# Patient Record
Sex: Female | Born: 1935 | Hispanic: No | Marital: Married | Smoking: Never smoker
Health system: Southern US, Community
[De-identification: ages and names within clinical notes are randomized; demographics above are authoritative.]

## PROBLEM LIST (undated history)

## (undated) DIAGNOSIS — D649 Anemia, unspecified: Secondary | ICD-10-CM

## (undated) DIAGNOSIS — I739 Peripheral vascular disease, unspecified: Secondary | ICD-10-CM

## (undated) DIAGNOSIS — E079 Disorder of thyroid, unspecified: Secondary | ICD-10-CM

## (undated) DIAGNOSIS — I272 Pulmonary hypertension, unspecified: Secondary | ICD-10-CM

## (undated) DIAGNOSIS — R092 Respiratory arrest: Secondary | ICD-10-CM

## (undated) DIAGNOSIS — N183 Chronic kidney disease, stage 3 unspecified: Secondary | ICD-10-CM

## (undated) DIAGNOSIS — N289 Disorder of kidney and ureter, unspecified: Secondary | ICD-10-CM

## (undated) DIAGNOSIS — E119 Type 2 diabetes mellitus without complications: Secondary | ICD-10-CM

## (undated) DIAGNOSIS — Z9289 Personal history of other medical treatment: Secondary | ICD-10-CM

## (undated) DIAGNOSIS — E785 Hyperlipidemia, unspecified: Secondary | ICD-10-CM

## (undated) DIAGNOSIS — Z951 Presence of aortocoronary bypass graft: Secondary | ICD-10-CM

## (undated) DIAGNOSIS — N179 Acute kidney failure, unspecified: Secondary | ICD-10-CM

## (undated) DIAGNOSIS — I340 Nonrheumatic mitral (valve) insufficiency: Secondary | ICD-10-CM

## (undated) DIAGNOSIS — E538 Deficiency of other specified B group vitamins: Secondary | ICD-10-CM

## (undated) DIAGNOSIS — I071 Rheumatic tricuspid insufficiency: Secondary | ICD-10-CM

## (undated) DIAGNOSIS — I5032 Chronic diastolic (congestive) heart failure: Secondary | ICD-10-CM

## (undated) DIAGNOSIS — N189 Chronic kidney disease, unspecified: Secondary | ICD-10-CM

## (undated) DIAGNOSIS — I70219 Atherosclerosis of native arteries of extremities with intermittent claudication, unspecified extremity: Secondary | ICD-10-CM

## (undated) DIAGNOSIS — I501 Left ventricular failure: Secondary | ICD-10-CM

## (undated) DIAGNOSIS — N952 Postmenopausal atrophic vaginitis: Secondary | ICD-10-CM

## (undated) DIAGNOSIS — I2510 Atherosclerotic heart disease of native coronary artery without angina pectoris: Secondary | ICD-10-CM

## (undated) HISTORY — DX: Pulmonary hypertension, unspecified: I27.20

## (undated) HISTORY — DX: Nonrheumatic mitral (valve) insufficiency: I34.0

## (undated) HISTORY — DX: Chronic kidney disease, unspecified: N18.9

## (undated) HISTORY — DX: Type 2 diabetes mellitus without complications: E11.9

## (undated) HISTORY — DX: Atherosclerotic heart disease of native coronary artery without angina pectoris: I25.10

## (undated) HISTORY — DX: Anemia, unspecified: D64.9

## (undated) HISTORY — DX: Chronic diastolic (congestive) heart failure: I50.32

## (undated) HISTORY — DX: Chronic kidney disease, stage 3 (moderate): N18.3

## (undated) HISTORY — DX: Rheumatic tricuspid insufficiency: I07.1

## (undated) HISTORY — DX: Peripheral vascular disease, unspecified: I73.9

## (undated) HISTORY — PX: TOTAL KNEE ARTHROPLASTY: SHX125

## (undated) HISTORY — DX: Acute kidney failure, unspecified: N17.9

## (undated) HISTORY — DX: Disorder of kidney and ureter, unspecified: N28.9

## (undated) HISTORY — DX: Personal history of other medical treatment: Z92.89

## (undated) HISTORY — DX: Deficiency of other specified B group vitamins: E53.8

## (undated) HISTORY — DX: Chronic kidney disease, stage 3 unspecified: N18.30

## (undated) HISTORY — DX: Left ventricular failure, unspecified: I50.1

## (undated) HISTORY — DX: Postmenopausal atrophic vaginitis: N95.2

## (undated) HISTORY — DX: Presence of aortocoronary bypass graft: Z95.1

---

## 2003-10-24 HISTORY — PX: CARDIAC CATHETERIZATION: SHX172

## 2004-04-14 ENCOUNTER — Encounter: Admission: RE | Admit: 2004-04-14 | Discharge: 2004-04-14 | Payer: Self-pay | Admitting: Cardiovascular Disease

## 2004-04-19 ENCOUNTER — Ambulatory Visit (HOSPITAL_COMMUNITY): Admission: RE | Admit: 2004-04-19 | Discharge: 2004-04-19 | Payer: Self-pay | Admitting: Cardiovascular Disease

## 2004-04-20 ENCOUNTER — Inpatient Hospital Stay (HOSPITAL_COMMUNITY): Admission: AD | Admit: 2004-04-20 | Discharge: 2004-04-21 | Payer: Self-pay | Admitting: Cardiovascular Disease

## 2004-06-29 ENCOUNTER — Encounter: Admission: RE | Admit: 2004-06-29 | Discharge: 2004-06-29 | Payer: Self-pay | Admitting: Cardiovascular Disease

## 2005-01-03 IMAGING — CR DG CHEST 2V
2 series · 2 of 2 positions shown · non-contrast
Comparison: none

CLINICAL DATA: Left chest pain for the past six months. 
TWO VIEW CHEST: 
Borderline enlarged cardiac silhouette.  Diffuse peribronchial thickening and accentuation of the interstitial markings.  Multiple small mediastinal, bilateral hilar, left axillary, right supraclavicular, and left neck calcified lymph nodes.  Small calcified granulomata at the left lung apex.  Small calcified granuloma or fibroadenoma in the posterior aspect of the right breast superiorly.  Calcified granulomata in the liver and spleen and calcified upper abdominal lymph nodes.  Diffuse osteopenia.  Mild scoliosis.

[view not recorded (1 of 2)]
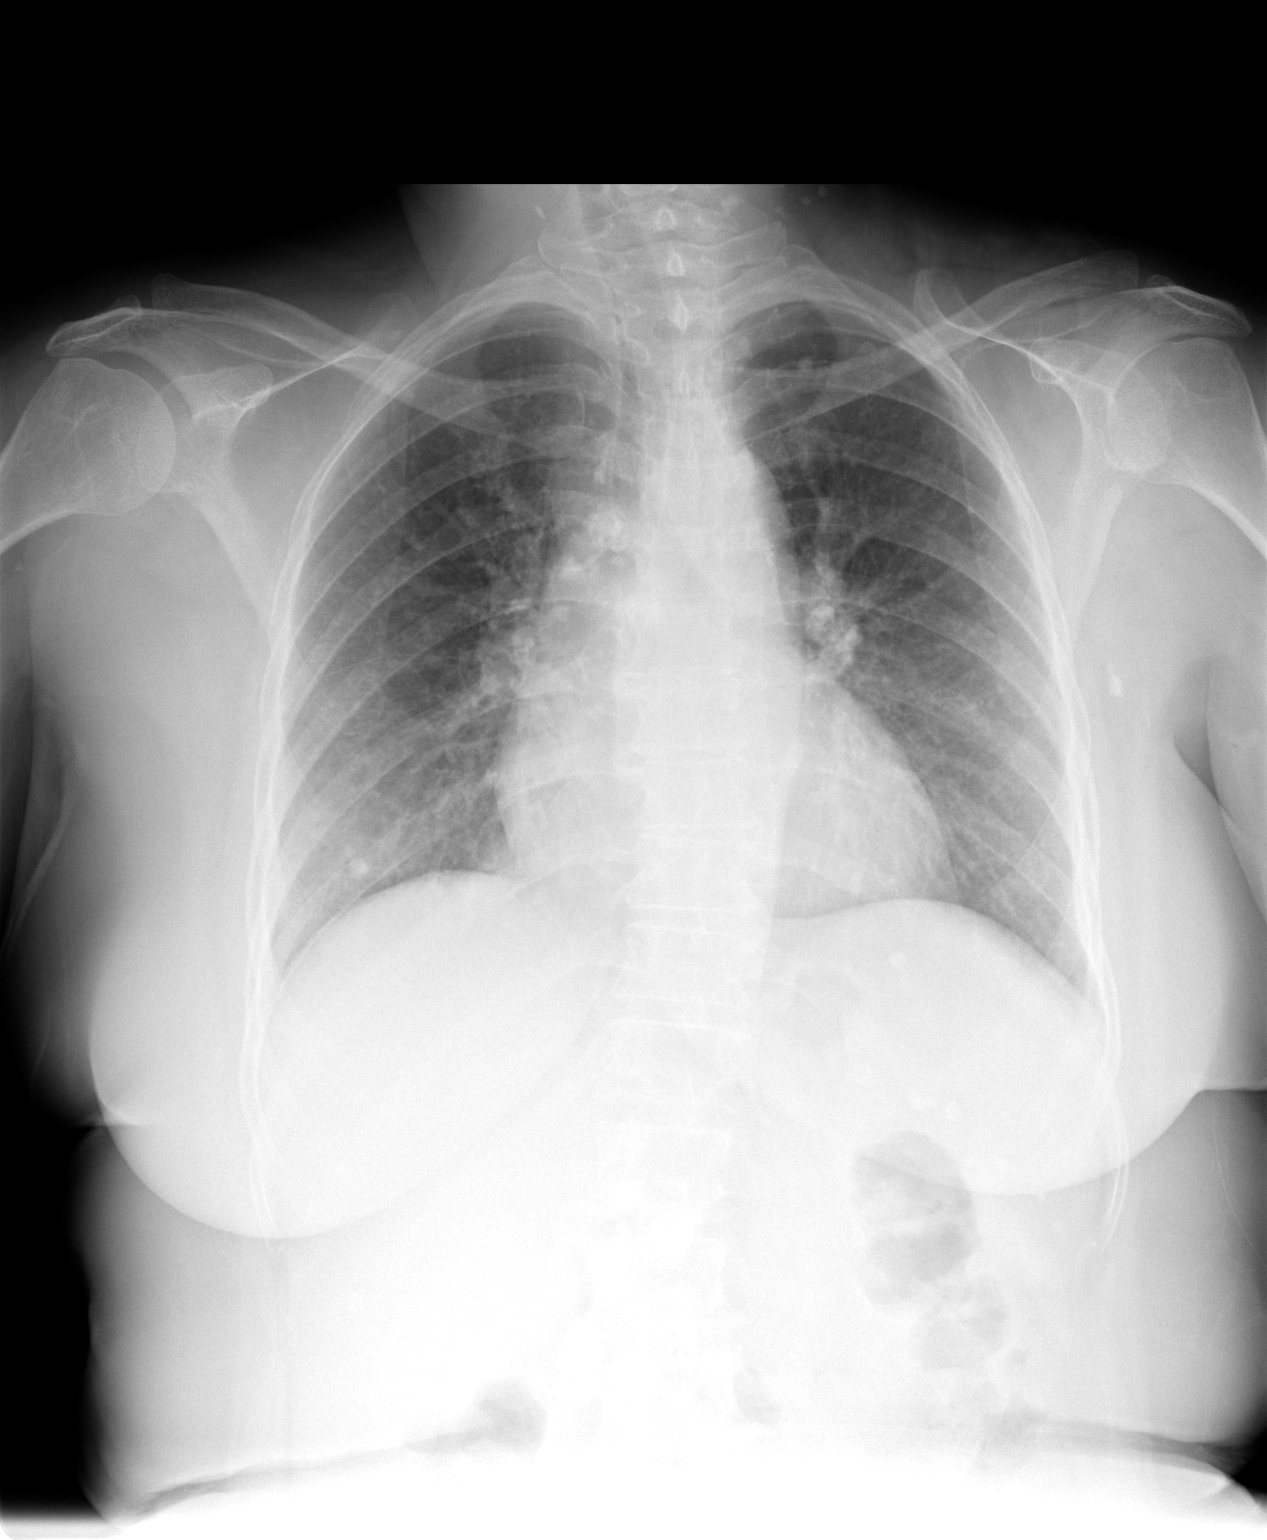

[view not recorded (2 of 2)]
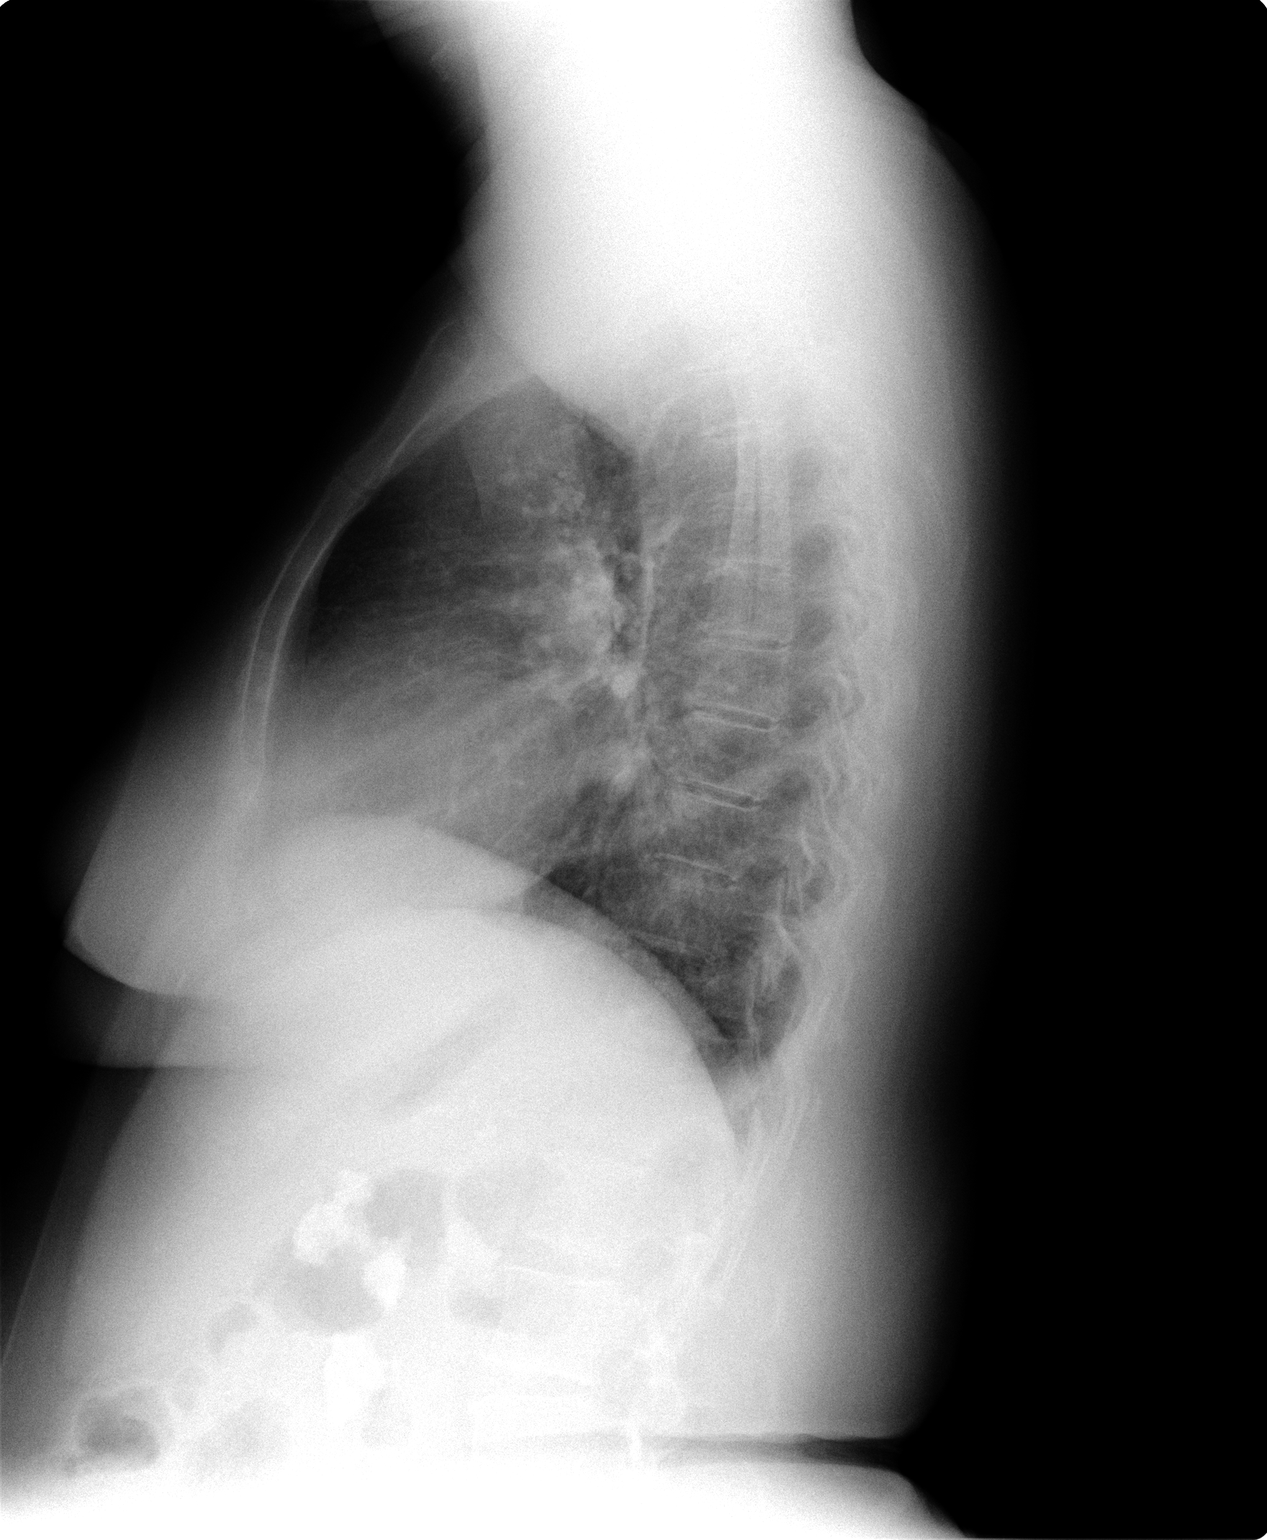

[2 of 2 positions shown; findings below may reference images not displayed]

IMPRESSION: 1.  Borderline cardiomegaly. 
2.  Mild to moderate chronic bronchitic changes and chronic interstitial lung disease. 
3.  Previous granulomatous infection.

## 2009-12-31 ENCOUNTER — Emergency Department (HOSPITAL_COMMUNITY): Admission: EM | Admit: 2009-12-31 | Discharge: 2009-12-31 | Payer: Self-pay | Admitting: Emergency Medicine

## 2010-03-14 ENCOUNTER — Inpatient Hospital Stay (HOSPITAL_COMMUNITY)
Admission: EM | Admit: 2010-03-14 | Discharge: 2010-03-31 | Payer: Self-pay | Source: Home / Self Care | Admitting: Emergency Medicine

## 2010-03-14 ENCOUNTER — Ambulatory Visit: Payer: Self-pay | Admitting: Critical Care Medicine

## 2010-03-15 ENCOUNTER — Encounter (INDEPENDENT_AMBULATORY_CARE_PROVIDER_SITE_OTHER): Payer: Self-pay | Admitting: Internal Medicine

## 2010-03-16 ENCOUNTER — Ambulatory Visit: Payer: Self-pay | Admitting: Surgery

## 2010-03-16 ENCOUNTER — Encounter: Payer: Self-pay | Admitting: Surgery

## 2010-03-23 DIAGNOSIS — I2510 Atherosclerotic heart disease of native coronary artery without angina pectoris: Secondary | ICD-10-CM | POA: Diagnosis present

## 2010-03-23 HISTORY — PX: CORONARY ARTERY BYPASS GRAFT: SHX141

## 2010-03-23 HISTORY — PX: CARDIAC CATHETERIZATION: SHX172

## 2010-04-06 ENCOUNTER — Ambulatory Visit: Payer: Self-pay | Admitting: Surgery

## 2010-04-19 ENCOUNTER — Ambulatory Visit: Payer: Self-pay | Admitting: Surgery

## 2010-04-19 ENCOUNTER — Encounter: Admission: RE | Admit: 2010-04-19 | Discharge: 2010-04-19 | Payer: Self-pay | Admitting: Surgery

## 2010-04-22 ENCOUNTER — Ambulatory Visit: Payer: Self-pay | Admitting: Surgery

## 2010-04-28 ENCOUNTER — Ambulatory Visit: Payer: Self-pay | Admitting: Surgery

## 2010-04-28 ENCOUNTER — Encounter (HOSPITAL_COMMUNITY): Admission: RE | Admit: 2010-04-28 | Discharge: 2010-07-01 | Payer: Self-pay | Admitting: Interventional Cardiology

## 2010-05-09 ENCOUNTER — Ambulatory Visit: Payer: Self-pay | Admitting: Surgery

## 2010-05-30 ENCOUNTER — Ambulatory Visit: Payer: Self-pay | Admitting: Surgery

## 2011-01-06 LAB — GLUCOSE, CAPILLARY
Glucose-Capillary: 191 mg/dL — ABNORMAL HIGH (ref 70–99)
Glucose-Capillary: 278 mg/dL — ABNORMAL HIGH (ref 70–99)

## 2011-01-07 LAB — GLUCOSE, CAPILLARY
Glucose-Capillary: 137 mg/dL — ABNORMAL HIGH (ref 70–99)
Glucose-Capillary: 248 mg/dL — ABNORMAL HIGH (ref 70–99)
Glucose-Capillary: 262 mg/dL — ABNORMAL HIGH (ref 70–99)
Glucose-Capillary: 334 mg/dL — ABNORMAL HIGH (ref 70–99)

## 2011-01-08 LAB — GLUCOSE, CAPILLARY
Glucose-Capillary: 235 mg/dL — ABNORMAL HIGH (ref 70–99)
Glucose-Capillary: 297 mg/dL — ABNORMAL HIGH (ref 70–99)

## 2011-01-08 IMAGING — CR DG CHEST 2V
2 series · 2 of 2 positions shown · non-contrast
Comparison: 03/29/2010

CLINICAL DATA: Status post CABG 03/23/2010.  No complains today.
Nonsmoker.

CHEST - 2 VIEW

[w chest pa]
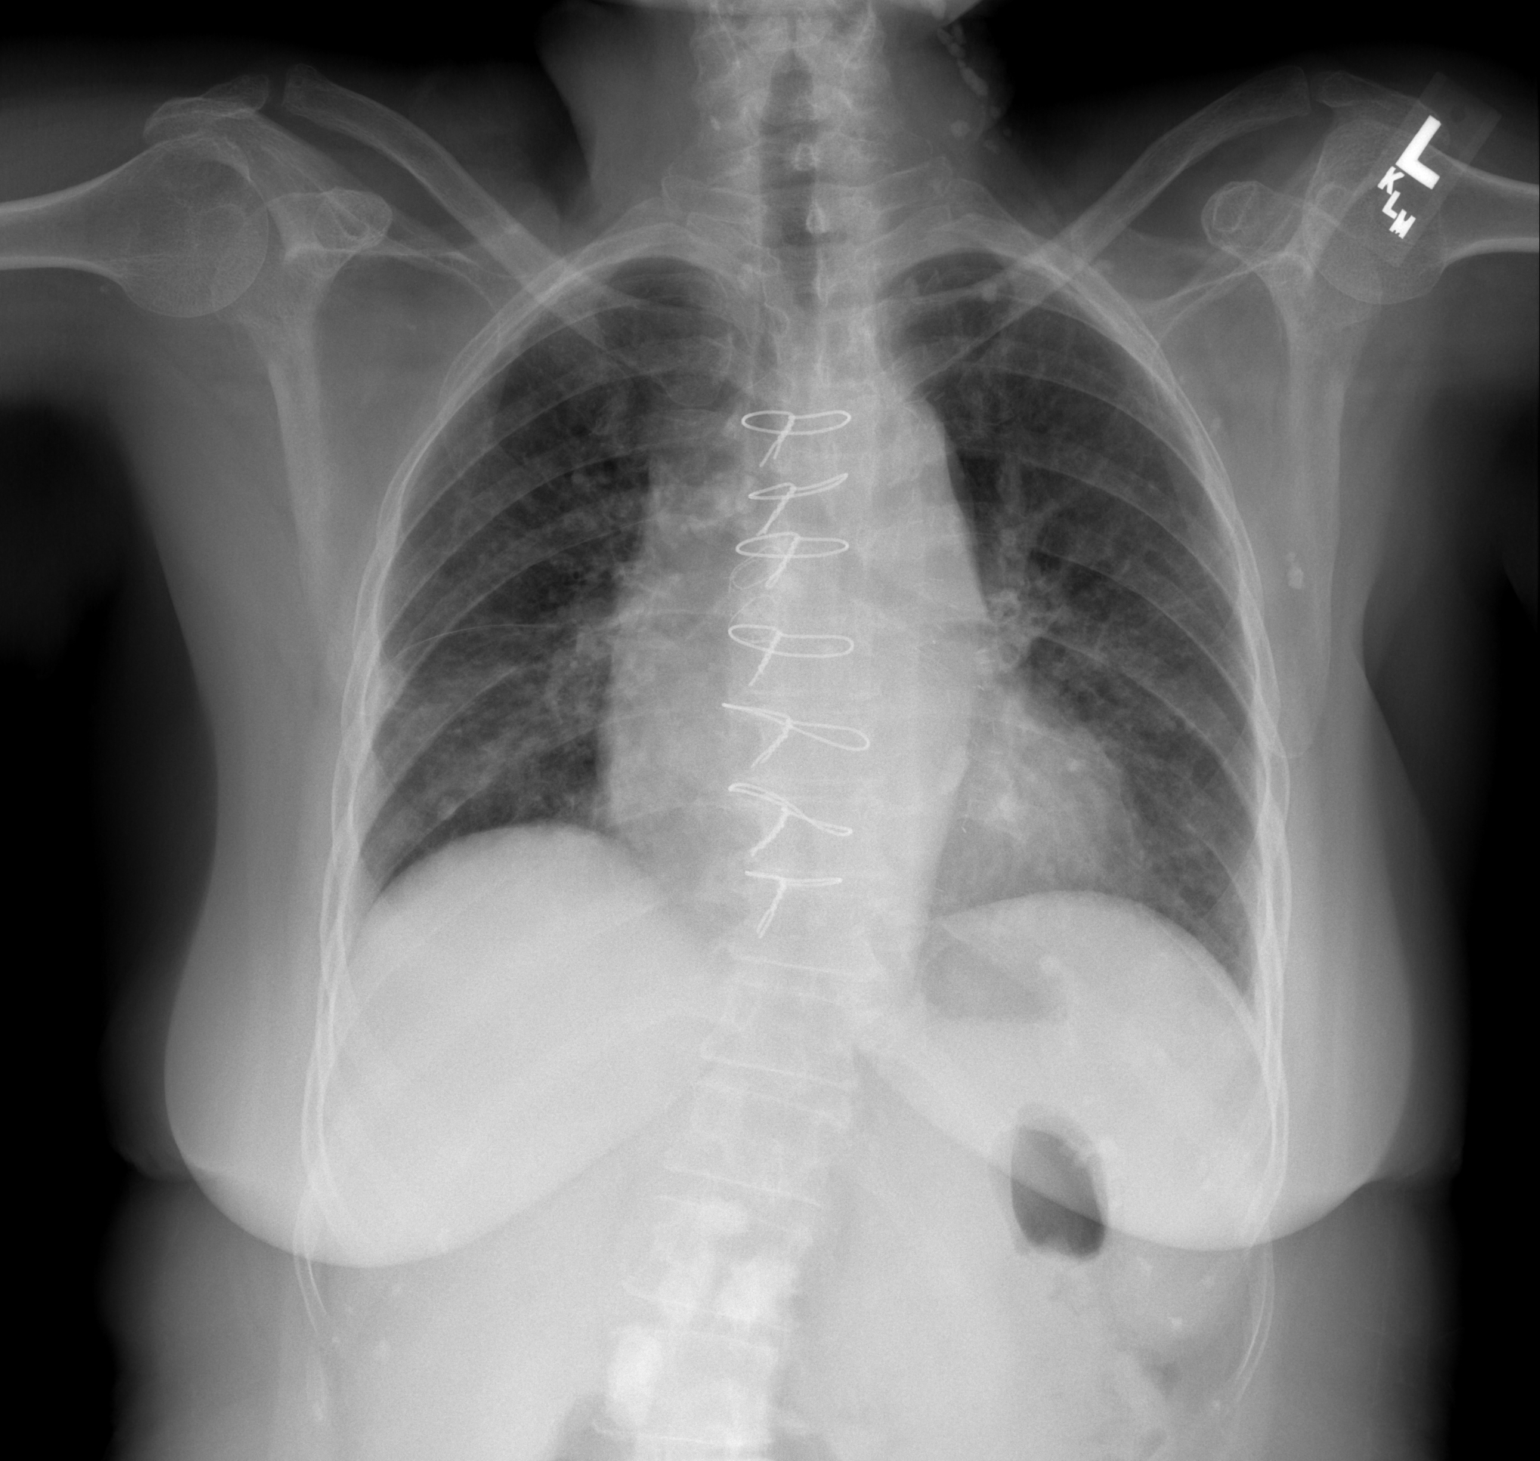

[w chest lat]
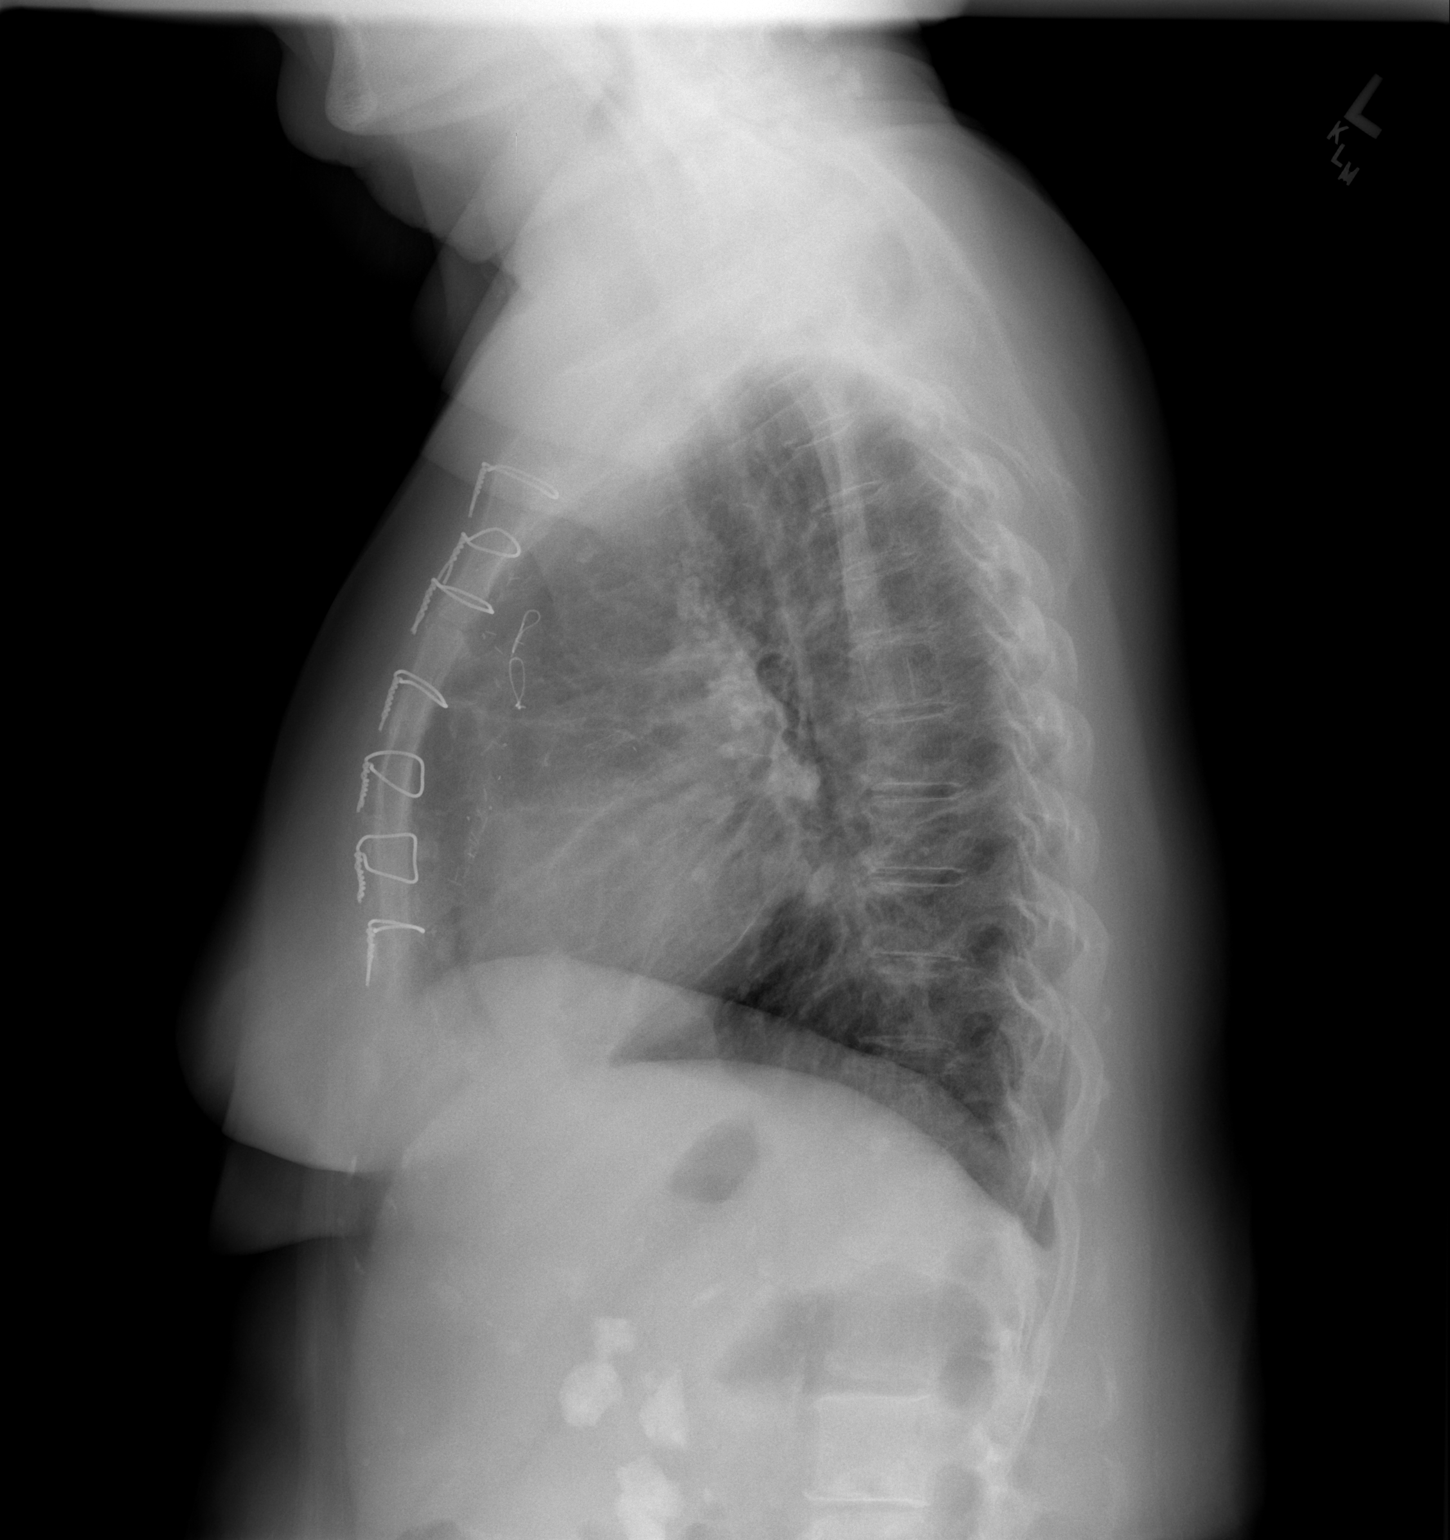

[2 of 2 positions shown; findings below may reference images not displayed]

FINDINGS: Prior median sternotomy for CABG.  Suspect remote right
rib trauma.  Calcified left axillary and low cervical lymph nodes.

Midline trachea.  Mild cardiomegaly accentuated by low lung
volumes.  Small bilateral pleural effusions have resolved. No
pneumothorax.  Resolution of interstitial edema.  Lung volumes
remain low.  This accentuates the pulmonary interstitium.  There
are calcified lymph nodes within the left side of the mediastinum
and probable granuloma at the left apex.  Calcified lymph nodes are
also identified within the upper abdomen.
IMPRESSION: 1.  Resolution of interstitial edema and bilateral pleural
effusions.
2.  Low lung volumes without acute process.
3.  Old granulomatous disease.

## 2011-01-09 LAB — GLUCOSE, CAPILLARY
Glucose-Capillary: 100 mg/dL — ABNORMAL HIGH (ref 70–99)
Glucose-Capillary: 101 mg/dL — ABNORMAL HIGH (ref 70–99)
Glucose-Capillary: 103 mg/dL — ABNORMAL HIGH (ref 70–99)
Glucose-Capillary: 105 mg/dL — ABNORMAL HIGH (ref 70–99)
Glucose-Capillary: 111 mg/dL — ABNORMAL HIGH (ref 70–99)
Glucose-Capillary: 114 mg/dL — ABNORMAL HIGH (ref 70–99)
Glucose-Capillary: 114 mg/dL — ABNORMAL HIGH (ref 70–99)
Glucose-Capillary: 120 mg/dL — ABNORMAL HIGH (ref 70–99)
Glucose-Capillary: 124 mg/dL — ABNORMAL HIGH (ref 70–99)
Glucose-Capillary: 125 mg/dL — ABNORMAL HIGH (ref 70–99)
Glucose-Capillary: 132 mg/dL — ABNORMAL HIGH (ref 70–99)
Glucose-Capillary: 162 mg/dL — ABNORMAL HIGH (ref 70–99)
Glucose-Capillary: 180 mg/dL — ABNORMAL HIGH (ref 70–99)
Glucose-Capillary: 184 mg/dL — ABNORMAL HIGH (ref 70–99)
Glucose-Capillary: 187 mg/dL — ABNORMAL HIGH (ref 70–99)
Glucose-Capillary: 201 mg/dL — ABNORMAL HIGH (ref 70–99)
Glucose-Capillary: 246 mg/dL — ABNORMAL HIGH (ref 70–99)
Glucose-Capillary: 57 mg/dL — ABNORMAL LOW (ref 70–99)
Glucose-Capillary: 68 mg/dL — ABNORMAL LOW (ref 70–99)
Glucose-Capillary: 69 mg/dL — ABNORMAL LOW (ref 70–99)
Glucose-Capillary: 76 mg/dL (ref 70–99)
Glucose-Capillary: 80 mg/dL (ref 70–99)
Glucose-Capillary: 83 mg/dL (ref 70–99)
Glucose-Capillary: 85 mg/dL (ref 70–99)
Glucose-Capillary: 89 mg/dL (ref 70–99)
Glucose-Capillary: 90 mg/dL (ref 70–99)
Glucose-Capillary: 109 mg/dL — ABNORMAL HIGH (ref 70–99)
Glucose-Capillary: 119 mg/dL — ABNORMAL HIGH (ref 70–99)
Glucose-Capillary: 138 mg/dL — ABNORMAL HIGH (ref 70–99)
Glucose-Capillary: 149 mg/dL — ABNORMAL HIGH (ref 70–99)
Glucose-Capillary: 165 mg/dL — ABNORMAL HIGH (ref 70–99)
Glucose-Capillary: 175 mg/dL — ABNORMAL HIGH (ref 70–99)
Glucose-Capillary: 176 mg/dL — ABNORMAL HIGH (ref 70–99)
Glucose-Capillary: 178 mg/dL — ABNORMAL HIGH (ref 70–99)
Glucose-Capillary: 184 mg/dL — ABNORMAL HIGH (ref 70–99)
Glucose-Capillary: 190 mg/dL — ABNORMAL HIGH (ref 70–99)
Glucose-Capillary: 190 mg/dL — ABNORMAL HIGH (ref 70–99)
Glucose-Capillary: 204 mg/dL — ABNORMAL HIGH (ref 70–99)
Glucose-Capillary: 217 mg/dL — ABNORMAL HIGH (ref 70–99)
Glucose-Capillary: 221 mg/dL — ABNORMAL HIGH (ref 70–99)
Glucose-Capillary: 229 mg/dL — ABNORMAL HIGH (ref 70–99)
Glucose-Capillary: 238 mg/dL — ABNORMAL HIGH (ref 70–99)
Glucose-Capillary: 257 mg/dL — ABNORMAL HIGH (ref 70–99)
Glucose-Capillary: 272 mg/dL — ABNORMAL HIGH (ref 70–99)
Glucose-Capillary: 280 mg/dL — ABNORMAL HIGH (ref 70–99)
Glucose-Capillary: 314 mg/dL — ABNORMAL HIGH (ref 70–99)
Glucose-Capillary: 318 mg/dL — ABNORMAL HIGH (ref 70–99)
Glucose-Capillary: 332 mg/dL — ABNORMAL HIGH (ref 70–99)
Glucose-Capillary: 375 mg/dL — ABNORMAL HIGH (ref 70–99)
Glucose-Capillary: 498 mg/dL — ABNORMAL HIGH (ref 70–99)
Glucose-Capillary: 78 mg/dL (ref 70–99)

## 2011-01-09 LAB — CBC
HCT: 24.5 % — ABNORMAL LOW (ref 36.0–46.0)
HCT: 24.6 % — ABNORMAL LOW (ref 36.0–46.0)
HCT: 25.2 % — ABNORMAL LOW (ref 36.0–46.0)
HCT: 26.1 % — ABNORMAL LOW (ref 36.0–46.0)
HCT: 26.8 % — ABNORMAL LOW (ref 36.0–46.0)
HCT: 31 % — ABNORMAL LOW (ref 36.0–46.0)
HCT: 31.2 % — ABNORMAL LOW (ref 36.0–46.0)
HCT: 29.3 % — ABNORMAL LOW (ref 36.0–46.0)
HCT: 29.4 % — ABNORMAL LOW (ref 36.0–46.0)
HCT: 31.9 % — ABNORMAL LOW (ref 36.0–46.0)
Hemoglobin: 10.4 g/dL — ABNORMAL LOW (ref 12.0–15.0)
Hemoglobin: 10.8 g/dL — ABNORMAL LOW (ref 12.0–15.0)
Hemoglobin: 8.2 g/dL — ABNORMAL LOW (ref 12.0–15.0)
Hemoglobin: 8.4 g/dL — ABNORMAL LOW (ref 12.0–15.0)
Hemoglobin: 9.1 g/dL — ABNORMAL LOW (ref 12.0–15.0)
Hemoglobin: 9.7 g/dL — ABNORMAL LOW (ref 12.0–15.0)
Hemoglobin: 10 g/dL — ABNORMAL LOW (ref 12.0–15.0)
Hemoglobin: 11.7 g/dL — ABNORMAL LOW (ref 12.0–15.0)
MCHC: 33.9 g/dL (ref 30.0–36.0)
MCHC: 34.1 g/dL (ref 30.0–36.0)
MCHC: 34.3 g/dL (ref 30.0–36.0)
MCHC: 34.6 g/dL (ref 30.0–36.0)
MCHC: 35 g/dL (ref 30.0–36.0)
MCHC: 34.2 g/dL (ref 30.0–36.0)
MCHC: 34.3 g/dL (ref 30.0–36.0)
MCV: 91.1 fL (ref 78.0–100.0)
MCV: 91.1 fL (ref 78.0–100.0)
MCV: 91.6 fL (ref 78.0–100.0)
MCV: 91.7 fL (ref 78.0–100.0)
MCV: 91.9 fL (ref 78.0–100.0)
MCV: 90.5 fL (ref 78.0–100.0)
MCV: 90.8 fL (ref 78.0–100.0)
MCV: 91.2 fL (ref 78.0–100.0)
Platelets: 104 10*3/uL — ABNORMAL LOW (ref 150–400)
Platelets: 108 10*3/uL — ABNORMAL LOW (ref 150–400)
Platelets: 147 10*3/uL — ABNORMAL LOW (ref 150–400)
Platelets: 226 10*3/uL (ref 150–400)
Platelets: 171 10*3/uL (ref 150–400)
RBC: 2.67 MIL/uL — ABNORMAL LOW (ref 3.87–5.11)
RBC: 2.69 MIL/uL — ABNORMAL LOW (ref 3.87–5.11)
RBC: 2.77 MIL/uL — ABNORMAL LOW (ref 3.87–5.11)
RBC: 2.94 MIL/uL — ABNORMAL LOW (ref 3.87–5.11)
RBC: 3.14 MIL/uL — ABNORMAL LOW (ref 3.87–5.11)
RBC: 3.48 MIL/uL — ABNORMAL LOW (ref 3.87–5.11)
RBC: 3.24 MIL/uL — ABNORMAL LOW (ref 3.87–5.11)
RBC: 3.32 MIL/uL — ABNORMAL LOW (ref 3.87–5.11)
RBC: 3.52 MIL/uL — ABNORMAL LOW (ref 3.87–5.11)
RBC: 3.74 MIL/uL — ABNORMAL LOW (ref 3.87–5.11)
RDW: 13.7 % (ref 11.5–15.5)
RDW: 13.7 % (ref 11.5–15.5)
RDW: 14.2 % (ref 11.5–15.5)
RDW: 14.1 % (ref 11.5–15.5)
RDW: 14.2 % (ref 11.5–15.5)
RDW: 14.2 % (ref 11.5–15.5)
WBC: 10.6 10*3/uL — ABNORMAL HIGH (ref 4.0–10.5)
WBC: 6.6 10*3/uL (ref 4.0–10.5)
WBC: 6.9 10*3/uL (ref 4.0–10.5)
WBC: 9.4 10*3/uL (ref 4.0–10.5)
WBC: 6.8 10*3/uL (ref 4.0–10.5)
WBC: 6.9 10*3/uL (ref 4.0–10.5)

## 2011-01-09 LAB — MRSA PCR SCREENING: MRSA by PCR: NEGATIVE

## 2011-01-09 LAB — COMPREHENSIVE METABOLIC PANEL
ALT: 17 U/L (ref 0–35)
Albumin: 3 g/dL — ABNORMAL LOW (ref 3.5–5.2)
Alkaline Phosphatase: 59 U/L (ref 39–117)
BUN: 21 mg/dL (ref 6–23)
Chloride: 99 mEq/L (ref 96–112)
Glucose, Bld: 255 mg/dL — ABNORMAL HIGH (ref 70–99)
Potassium: 3.5 mEq/L (ref 3.5–5.1)
Sodium: 134 mEq/L — ABNORMAL LOW (ref 135–145)
Total Bilirubin: 0.2 mg/dL — ABNORMAL LOW (ref 0.3–1.2)
Total Protein: 6.8 g/dL (ref 6.0–8.3)

## 2011-01-09 LAB — BASIC METABOLIC PANEL
BUN: 16 mg/dL (ref 6–23)
BUN: 21 mg/dL (ref 6–23)
BUN: 27 mg/dL — ABNORMAL HIGH (ref 6–23)
BUN: 29 mg/dL — ABNORMAL HIGH (ref 6–23)
BUN: 9 mg/dL (ref 6–23)
CO2: 20 mEq/L (ref 19–32)
CO2: 22 mEq/L (ref 19–32)
CO2: 27 mEq/L (ref 19–32)
CO2: 23 mEq/L (ref 19–32)
CO2: 28 mEq/L (ref 19–32)
Calcium: 6.5 mg/dL — ABNORMAL LOW (ref 8.4–10.5)
Calcium: 6.6 mg/dL — ABNORMAL LOW (ref 8.4–10.5)
Chloride: 103 mEq/L (ref 96–112)
Chloride: 107 mEq/L (ref 96–112)
Chloride: 107 mEq/L (ref 96–112)
Chloride: 107 mEq/L (ref 96–112)
Chloride: 108 mEq/L (ref 96–112)
Chloride: 104 mEq/L (ref 96–112)
Chloride: 105 mEq/L (ref 96–112)
Chloride: 108 mEq/L (ref 96–112)
Chloride: 98 mEq/L (ref 96–112)
Creatinine, Ser: 0.94 mg/dL (ref 0.4–1.2)
Creatinine, Ser: 1.03 mg/dL (ref 0.4–1.2)
Creatinine, Ser: 1.16 mg/dL (ref 0.4–1.2)
Creatinine, Ser: 1.3 mg/dL — ABNORMAL HIGH (ref 0.4–1.2)
Creatinine, Ser: 0.82 mg/dL (ref 0.4–1.2)
Creatinine, Ser: 0.83 mg/dL (ref 0.4–1.2)
Creatinine, Ser: 1.15 mg/dL (ref 0.4–1.2)
GFR calc Af Amer: 48 mL/min — ABNORMAL LOW (ref >60)
GFR calc Af Amer: 49 mL/min — ABNORMAL LOW (ref >60)
GFR calc Af Amer: >60 mL/min (ref >60)
GFR calc Af Amer: 56 mL/min — ABNORMAL LOW (ref >60)
GFR calc Af Amer: >60 mL/min (ref >60)
GFR calc Af Amer: >60 mL/min (ref >60)
GFR calc Af Amer: >60 mL/min (ref >60)
GFR calc non Af Amer: 40 mL/min — ABNORMAL LOW (ref >60)
GFR calc non Af Amer: 52 mL/min — ABNORMAL LOW (ref >60)
GFR calc non Af Amer: 55 mL/min — ABNORMAL LOW (ref >60)
GFR calc non Af Amer: 59 mL/min — ABNORMAL LOW (ref >60)
GFR calc non Af Amer: >60 mL/min (ref >60)
Glucose, Bld: 144 mg/dL — ABNORMAL HIGH (ref 70–99)
Glucose, Bld: 173 mg/dL — ABNORMAL HIGH (ref 70–99)
Glucose, Bld: 175 mg/dL — ABNORMAL HIGH (ref 70–99)
Glucose, Bld: 66 mg/dL — ABNORMAL LOW (ref 70–99)
Glucose, Bld: 273 mg/dL — ABNORMAL HIGH (ref 70–99)
Glucose, Bld: 411 mg/dL — ABNORMAL HIGH (ref 70–99)
Potassium: 3.5 mEq/L (ref 3.5–5.1)
Potassium: 4 mEq/L (ref 3.5–5.1)
Potassium: 4.4 mEq/L (ref 3.5–5.1)
Potassium: 4.5 mEq/L (ref 3.5–5.1)
Potassium: 4.8 mEq/L (ref 3.5–5.1)
Potassium: 5.1 mEq/L (ref 3.5–5.1)
Potassium: 3.8 mEq/L (ref 3.5–5.1)
Potassium: 4 mEq/L (ref 3.5–5.1)
Potassium: 4.1 mEq/L (ref 3.5–5.1)
Sodium: 132 mEq/L — ABNORMAL LOW (ref 135–145)
Sodium: 136 mEq/L (ref 135–145)
Sodium: 137 mEq/L (ref 135–145)
Sodium: 139 mEq/L (ref 135–145)
Sodium: 140 mEq/L (ref 135–145)

## 2011-01-09 LAB — CROSSMATCH: Antibody Screen: NEGATIVE

## 2011-01-09 LAB — CLOSTRIDIUM DIFFICILE EIA: C difficile Toxins A+B, EIA: NEGATIVE

## 2011-01-09 LAB — POCT I-STAT, CHEM 8
BUN: 22 mg/dL (ref 6–23)
Chloride: 108 mEq/L (ref 96–112)
Creatinine, Ser: 1.3 mg/dL — ABNORMAL HIGH (ref 0.4–1.2)
HCT: 30 % — ABNORMAL LOW (ref 36.0–46.0)
Hemoglobin: 10.2 g/dL — ABNORMAL LOW (ref 12.0–15.0)
Potassium: 4.4 mEq/L (ref 3.5–5.1)
Potassium: 4.6 mEq/L (ref 3.5–5.1)
Sodium: 138 mEq/L (ref 135–145)
Sodium: 139 mEq/L (ref 135–145)

## 2011-01-09 LAB — HEMOGLOBIN AND HEMATOCRIT, BLOOD
HCT: 23.1 % — ABNORMAL LOW (ref 36.0–46.0)
Hemoglobin: 8 g/dL — ABNORMAL LOW (ref 12.0–15.0)

## 2011-01-09 LAB — POCT I-STAT 3, ART BLOOD GAS (G3+)
Acid-Base Excess: 2 mmol/L (ref 0.0–2.0)
Acid-base deficit: 3 mmol/L — ABNORMAL HIGH (ref 0.0–2.0)
Bicarbonate: 25.5 mEq/L — ABNORMAL HIGH (ref 20.0–24.0)
Patient temperature: 35.9
Patient temperature: 37.4
TCO2: 22 mmol/L (ref 0–100)
TCO2: 27 mmol/L (ref 0–100)
pH, Arterial: 7.331 — ABNORMAL LOW (ref 7.350–7.400)
pH, Arterial: 7.378 (ref 7.350–7.400)
pO2, Arterial: 140 mmHg — ABNORMAL HIGH (ref 80.0–100.0)

## 2011-01-09 LAB — DIFFERENTIAL
Basophils Relative: 0 % (ref 0–1)
Eosinophils Absolute: 0.1 10*3/uL (ref 0.0–0.7)
Monocytes Absolute: 0.5 10*3/uL (ref 0.1–1.0)
Monocytes Relative: 6 % (ref 3–12)
Neutrophils Relative %: 61 % (ref 43–77)

## 2011-01-09 LAB — POCT I-STAT 4, (NA,K, GLUC, HGB,HCT)
Glucose, Bld: 161 mg/dL — ABNORMAL HIGH (ref 70–99)
Glucose, Bld: 76 mg/dL (ref 70–99)
Glucose, Bld: 88 mg/dL (ref 70–99)
Glucose, Bld: 90 mg/dL (ref 70–99)
HCT: 26 % — ABNORMAL LOW (ref 36.0–46.0)
HCT: 33 % — ABNORMAL LOW (ref 36.0–46.0)
Hemoglobin: 7.8 g/dL — ABNORMAL LOW (ref 12.0–15.0)
Hemoglobin: 8.2 g/dL — ABNORMAL LOW (ref 12.0–15.0)
Hemoglobin: 8.8 g/dL — ABNORMAL LOW (ref 12.0–15.0)
Hemoglobin: 9.5 g/dL — ABNORMAL LOW (ref 12.0–15.0)
Potassium: 3.6 mEq/L (ref 3.5–5.1)
Potassium: 3.7 mEq/L (ref 3.5–5.1)
Potassium: 3.8 mEq/L (ref 3.5–5.1)
Potassium: 5.2 mEq/L — ABNORMAL HIGH (ref 3.5–5.1)
Sodium: 132 mEq/L — ABNORMAL LOW (ref 135–145)
Sodium: 135 mEq/L (ref 135–145)
Sodium: 138 mEq/L (ref 135–145)
Sodium: 139 mEq/L (ref 135–145)
Sodium: 141 mEq/L (ref 135–145)

## 2011-01-09 LAB — PROTIME-INR
INR: 1.02 (ref 0.00–1.49)
INR: 1.17 (ref 0.00–1.49)
Prothrombin Time: 13.3 seconds (ref 11.6–15.2)

## 2011-01-09 LAB — ABO/RH: ABO/RH(D): A POS

## 2011-01-09 LAB — BLOOD GAS, ARTERIAL
Acid-Base Excess: 3.8 mmol/L — ABNORMAL HIGH (ref 0.0–2.0)
Bicarbonate: 27.7 mEq/L — ABNORMAL HIGH (ref 20.0–24.0)
TCO2: 28.9 mmol/L (ref 0–100)
pCO2 arterial: 40.8 mmHg (ref 35.0–45.0)
pH, Arterial: 7.447 — ABNORMAL HIGH (ref 7.350–7.400)
pO2, Arterial: 83 mmHg (ref 80.0–100.0)

## 2011-01-09 LAB — TYPE AND SCREEN
ABO/RH(D): A POS
Antibody Screen: NEGATIVE

## 2011-01-09 LAB — CARDIAC PANEL(CRET KIN+CKTOT+MB+TROPI)
CK, MB: 1.2 ng/mL (ref 0.3–4.0)
Relative Index: INVALID (ref 0.0–2.5)
Relative Index: INVALID (ref 0.0–2.5)
Total CK: 40 U/L (ref 7–177)
Troponin I: 0.01 ng/mL (ref 0.00–0.06)
Troponin I: 0.02 ng/mL (ref 0.00–0.06)

## 2011-01-09 LAB — POCT CARDIAC MARKERS
CKMB, poc: <1 ng/mL — ABNORMAL LOW (ref 1.0–8.0)
Myoglobin, poc: 43.8 ng/mL (ref 12–200)
Troponin i, poc: <0.05 ng/mL (ref 0.00–0.09)

## 2011-01-09 LAB — MAGNESIUM
Magnesium: 2.9 mg/dL — ABNORMAL HIGH (ref 1.5–2.5)
Magnesium: 3.5 mg/dL — ABNORMAL HIGH (ref 1.5–2.5)

## 2011-01-09 LAB — CREATININE, SERUM
Creatinine, Ser: 0.83 mg/dL (ref 0.4–1.2)
GFR calc non Af Amer: >60 mL/min (ref >60)

## 2011-01-09 LAB — HEPARIN LEVEL (UNFRACTIONATED)
Heparin Unfractionated: 0.48 IU/mL (ref 0.30–0.70)
Heparin Unfractionated: 0.72 IU/mL — ABNORMAL HIGH (ref 0.30–0.70)

## 2011-03-07 NOTE — Assessment & Plan Note (Signed)
OFFICE VISIT   Margaret [name], Margaret [name]  DOB:  1934-03-08                                        May 09, 2010  CHART #:  [id number]   HISTORY:  The patient is status post coronary artery bypass grafting x4  done by Dr. Laneta Simmers, March 23, 2010.  The patient is progressing well from  her surgery.  She presents today for further followup of her right EVH  site.  The patient has been doing daily dressing changes at the site.  She denies any fevers, drainage from the site, redness, nausea, or  vomiting.  She is up ambulating well and doing cardiac rehab at this  time.  She is without complaints.   PHYSICAL EXAMINATION:  Vital Signs:  Blood pressure 130/68, pulse 105,  respirations of 18, and O2 sats 96% on room air.  Respiratory:  Clear to  auscultation bilaterally.  Cardiac:  Regular rate and rhythm.  Incisions:  The patient's sternal incision is well healed.  Extremities:  Her right lower extremity wound, good granulation tissue noted.  This is  healing well.  There is no signs of cellulitis noted at this time.   IMPRESSION:  We will plan to continue daily wet to dry dressing changes  at the site and we will have the patient followup in 3 weeks for further  evaluation.  The patient is told in the interim if she has any surgical  questions, she is to contact us.  The patient is in agreement.   Evelene Croon, M.D.  Electronically Signed   KMD/MEDQ  D:  05/09/2010  T:  05/10/2010  Job:  [id number]   cc:   Corky Crafts, MD

## 2011-03-07 NOTE — Assessment & Plan Note (Signed)
OFFICE VISIT   TEJAH, BREKKE  DOB:  [birth year]                                        April 19, 2010  CHART #:  [id number]   HISTORY:  The patient returned to my office today for followup status  post coronary artery bypass graft surgery on March 23, 2010.  She has been  feeling fairly well overall with some mild chest wall discomfort on both  sides.  Her family said that she has not been walking very much because  she said that her legs get tired quickly.  She denies any shortness of  breath.   PHYSICAL EXAMINATION:  Blood pressure 143/78, pulse is 106 and regular,  and respiratory rate is 18, unlabored.  Oxygen saturation on room air is  97%.  She looks well.  Cardiac exam shows regular rate and rhythm with  normal heart sounds.  Her lung exam is clear.  The chest incision is  healing well and the sternum is stable.  She has 3 small right leg  incisions from saphenous vein harvesting.  The upper and middle incision  are healing well.  The lowermost incision is open slightly with minimal  drainage and no sign of infection.  There is no significant peripheral  edema.   DIAGNOSTIC TESTS:  Followup chest x-ray shows clear lung fields and no  pleural effusions.   MEDICATIONS:  1. Aspirin 325 mg daily.  2. Lasix 40 mg b.i.d.  3. Magic mouthwash b.i.d.  4. Lopressor 12.5 mg b.i.d.  5. Potassium 20 mEq daily.  6. Ultram 50 mg p.r.n. for pain.  7. Alendronate 70 mg weekly.  8. Ferrous sulfate daily.  9. Amaryl 4 mg daily.  10.Lipitor 20 mg daily.  11.Metformin 500 mg b.i.d.   IMPRESSION:  Overall, the patient is making a fairly good recovery  following her surgery.  I think she would benefit from attending cardiac  rehab.  She does have a history of fairly severe spinal stenosis, but  said that she has not had any problems with pain down her legs related  to that she had preoperatively.  I asked her to keep a dry dressing over  her open saphenous vein  harvest site and I would expect this to heal  quickly without any intervention.  She will continue followup with Dr.  Eldridge Dace for her cardiology care and with Dr. Catha Gosselin for her  general medical care.  I told her she did not need to return to see me  unless she developed any redness or tenderness around her saphenous vein  harvest incision.   Evelene Croon, M.D.  Electronically Signed   BB/MEDQ  D:  04/19/2010  T:  04/20/2010  Job:  [id number]   cc:   Corky Crafts, MD  Anna Genre Little, M.D.

## 2011-03-07 NOTE — Assessment & Plan Note (Signed)
OFFICE VISIT   Margaret [name], Margaret [name]  DOB:  02-15-1934                                        May 30, 2010  CHART #:  [id number]   HISTORY OF PRESENT ILLNESS:  The patient is status post coronary artery  bypass grafting x4 done by Dr. Laneta Simmers, March 23, 2010.  She was last seen  in the office May 09, 2010, for followup of her right knee EVH site  wound.  She presents today for recheck.  The patient was without  complaints.  She feels that it is healing well.  Denies any fevers,  drainage, or redness at the site.  She has an appointment to see Dr.  Eldridge Dace in September and is currently questioning when she can go back  to [place].  She continues to ambulate and working with cardiac rehab.   PHYSICAL EXAMINATION:  Vital Signs:  Blood pressure 131/77, pulse 102,  respirations of 18, and O2 sats 96% on room air.  Respiratory:  Clear to  auscultation bilaterally.  Cardiac:  Regular rate and rhythm.  Incisions:  The patient's sternal incision is well healed.  Her right  EVH wound site at the knee is completely healed.  No cellulitis noted.   IMPRESSION:  The patient continues to progress well.  At this time, her  wound has healed completely and she does not need to do any further wet  to dry dressing changes.  She needs to continue ambulating 3-4 times per  day and working with cardiac rehab.  At this time, the patient is stable  from a surgical standpoint and I will release her from the office.  She  is to keep Dr. Hoyle Barr appointment for September.  She is told if she  has any surgical questions, she is to contact us.  She is in agreement.   Evelene Croon, M.D.  Electronically Signed   KMD/MEDQ  D:  05/30/2010  T:  05/31/2010  Job:  [id number]   cc:   Corky Crafts, MD

## 2011-03-10 NOTE — Discharge Summary (Signed)
[name], [name]                              ACCOUNT NO.:  [id number]   MEDICAL RECORD NO.:  [id number]                   PATIENT TYPE:  INP   LOCATION:  2920                                 FACILITY:  [facility]   PHYSICIAN:  Ricki Rodriguez, M.D.               DATE OF BIRTH:  Sep 17, 1934   DATE OF ADMISSION:  04/20/2004  DATE OF DISCHARGE:  04/21/2004                                 DISCHARGE SUMMARY   PRINCIPAL DIAGNOSES:  1. Coronary atherosclerosis of native coronary vessel.  2. Angina pectoris.  3. Ventricular fibrillation.  4. Adverse effect of __________.  5. Diabetes mellitus type 2.  6. Hypertension.  7. Hypothyroidism.   PRINCIPAL PROCEDURE:  Left heart catheterization, selective coronary  angiography, left ventricular function study done by Dr. Orpah Cobb on  April 21, 2004.   DISCHARGE MEDICATIONS:  1. Levothyroxine 100 mcg one daily.  2. Norvasc 2.5 mg one daily.  3. Plavix 75 mg one daily.  4. Losartan and hydrochlorothiazide (equivalent to Hyzaar) one daily.  5. Actos 15 mg one daily.  6. Glucotrol XL 5 mg b.i.d.  7. Aspirin 81 mg daily.  8. K-Dur 10 mEq daily.  9. Toprol XL 25 mg daily.  10.      Lipitor 20 mg one daily.  11.      Pain management:  Tylenol as needed.   ACTIVITY:  Two days of sedentary lifestyle, then resume previous activity.   DISCHARGE DIET:  Low fat, low salt, carbohydrate modified diet.   WOUND CARE:  The patient will notify if right groin pain, swelling, or  discharge.   FOLLOWUP:  By Dr. Orpah Cobb in one week.  The patient to call __________  for appointment.   HISTORY:  This 77 year old Asian female with known coronary artery disease  and angioplasty in August of 1998 had recurrent chest pain radiating to left  arm lasting for few minutes.  No cough.  No shortness of breath.  No fever.  No sweating spell.   PAST MEDICAL HISTORY:  1. Diabetes for 10 years.  2. Hypertension for six months.  3. Coronary artery disease.   PHYSICAL EXAMINATION:  VITAL SIGNS:  Pulse 100, respirations 20, blood  pressure 147/76, height 68 inches, weight 158.4 pounds.  HEENT:  The patient was normocephalic, atraumatic with brown eyes.  Pupils  equal, reacting to light.  Extraocular movement intact.  Conjunctivae pink.  Sclerae nonicteric.  NECK:  No JVD, no carotid bruit.  LUNGS:  Clear bilaterally.  HEART:  Normal S1, S2 with grade 2/6 systolic murmur.  ABDOMEN:  Soft and nontender.  EXTREMITIES:  No edema, cyanosis, clubbing.  CNS:  Grossly intact and patient had bilateral equal grips.   LABORATORY DATA:  Normal hemoglobin, hematocrit, WBC count, platelet count.  Normal electrolytes, BUN, creatinine.  EKG:  Sinus rhythm with septal  infarct.   Left heart catheterization with selective  coronary angiography and left  ventricular function study showed distal apical hypokinesia with 50%  ejection fraction and proximal near total occlusion of the left anterior  descending coronary artery disease, moderate right coronary artery disease.  The patient underwent PTCA/stent of LAD; however, had ventricular  fibrillation after infusion of intracoronary nitroglycerin requiring 150  joules of biphasic shock.  The patient converted to sinus rhythm.  The  procedure was terminated and patient was monitored in coronary care unit.   HOSPITAL COURSE:  The patient was admitted to coronary care unit after  undergoing cardiac catheterization procedure.  Because of her severe left  anterior descending coronary artery disease, Dr. Rinaldo Cloud was  consulted.  The patient had ventricular fibrillation prior to starting  placing a wire or balloon.  After injection of intracoronary nitroglycerin  she converted to sinus rhythm with 120 joules of biphasic shock and the  procedure was terminated per my request and patient was monitored in  coronary care unit with no further chest pain or additional complication and  patient was discharged home in  satisfactory condition with follow-up by me  in one week.                                                Ricki Rodriguez, M.D.    ASK/MEDQ  D:  06/09/2004  T:  06/10/2004  Job:  [id number]

## 2011-03-10 NOTE — Cardiovascular Report (Signed)
Margaret [name], ROSEMAN                              ACCOUNT NO.:  [id number]   MEDICAL RECORD NO.:  [id number]                   PATIENT TYPE:  INP   LOCATION:  2920                                 FACILITY:  [facility]   PHYSICIAN:  Ricki Rodriguez, M.D.               DATE OF BIRTH:  09-09-1934   DATE OF PROCEDURE:  04/21/2004  DATE OF DISCHARGE:  04/21/2004                              CARDIAC CATHETERIZATION   PROCEDURES:  1. Left heart catheterization.  2. Selective coronary angiography.  3. Left ventricular function study.   INDICATION:  This 77 year old Asian-American female had chest pain with  abnormal nuclear stress test.   APPROACH:  Right femoral artery using 5-French sheath and catheter.   COMPLICATIONS:  None.   HEMODYNAMIC DATA:  The left ventricular pressure was 174/70 and aortic  pressure was 175/79.   CORONARY ANATOMY:  1. LEFT MAIN:  The left main coronary artery was short and unremarkable.  2. LEFT ANTERIOR DESCENDING:  The left anterior descending coronary artery     had proximal mid total occlusion, posterior diagonal 1 origin.  The     diagonal vessel otherwise was unremarkable.  There was very faint filling     of the mid and distal LAD both antegrade and retrograde by collateral     vessels.  3. LEFT CIRCUMFLEX:  Left circumflex coronary artery had proximal luminal     irregularities.  Its obtuse marginal branch 1 had an ostial 20% stenosis.  4. RIGHT CORONARY ARTERY:  The right coronary artery had 50% mid-vessel     stenosis.  The marginal vessel had ostial 90% stenosis, but was a small     vessel.  The posterolateral and posterior descending coronary artery were     unremarkable.   LEFT VENTRICULOGRAM:  The left ventriculogram showed distal and apical  hypokinesia with an ejection fraction of 50%.   IMPRESSION:  1. Severe left anterior descending coronary artery disease.  2. Moderate right coronary artery disease.  3. Mild left ventricular systolic  dysfunction.   RECOMMENDATIONS:  This patient may undergo PTCA stent of LAD.  Dr. Eduardo Osier.  Harwani was notified of the procedure.   DESCRIPTION OF PROCEDURE:  The sheath were exchanged to 7-French, and  intracoronary nitroglycerin was infused.  Within a few seconds, the patient  had ventricular fibrillation.  This was converted to sinus rhythm by  giving 120 joules of biphasic shock after a precordial thump by me, the  patient converted to sinus rhythm, and had no immediate post-procedure  complications.  However, the procedure was terminated per my advice, pending  further discussion with the family and the patient.  Ricki Rodriguez, M.D.    ASK/MEDQ  D:  04/29/2004  T:  05/01/2004  Job:  [id number]

## 2013-04-22 DIAGNOSIS — N952 Postmenopausal atrophic vaginitis: Secondary | ICD-10-CM

## 2013-04-22 HISTORY — DX: Postmenopausal atrophic vaginitis: N95.2

## 2013-07-12 ENCOUNTER — Encounter: Payer: Self-pay | Admitting: *Deleted

## 2013-07-12 ENCOUNTER — Encounter: Payer: Self-pay | Admitting: Interventional Cardiology

## 2013-07-12 DIAGNOSIS — E538 Deficiency of other specified B group vitamins: Secondary | ICD-10-CM | POA: Insufficient documentation

## 2013-07-12 DIAGNOSIS — E119 Type 2 diabetes mellitus without complications: Secondary | ICD-10-CM | POA: Insufficient documentation

## 2013-07-12 DIAGNOSIS — E079 Disorder of thyroid, unspecified: Secondary | ICD-10-CM | POA: Insufficient documentation

## 2013-07-24 ENCOUNTER — Ambulatory Visit: Payer: Self-pay | Admitting: Interventional Cardiology

## 2014-02-27 ENCOUNTER — Telehealth: Payer: Self-pay | Admitting: Interventional Cardiology

## 2014-02-27 NOTE — Telephone Encounter (Signed)
New message ° ° ° ° ° ° ° ° ° °Pt has a question about medications °

## 2014-02-27 NOTE — Telephone Encounter (Signed)
Son calling regarding his Mother who is currently in [place].Marland Kitchen  He states the doctors there are wanting to do an angioplasty.  He states she is currently in ICU.  He states she has fluid in her lungs and has 40 % pumping function. States he just came back from [place] and he is her POA but he doesn't know if she should have this procedure since she is 80 yrs old and has fluid in her lungs. He doesn't know why they want to do the procedure.  He is calling here for advise since he his her POA and has to make that decision.  He states she really is not alert to make decisions.  Advised Dr. Eldridge Dace is not in the office today but would forward message to him.  Advised it is hard to advise when we don't know all the circumstances.  He understands but would like some guidance. He states the best number to call them are [phone]

## 2014-03-03 NOTE — Telephone Encounter (Signed)
Son's # not accepting calls.

## 2014-03-03 NOTE — Telephone Encounter (Signed)
Key question is if the doctors think that the angioplasty will help the pumping fucntion and help remove the fluid in teh lungs.  If they answer yes, then they should proceed.  If he can send what vessel they want to fix, that would be helpful.

## 2014-03-06 NOTE — Telephone Encounter (Signed)
"  not accepting calls".

## 2014-03-13 NOTE — Telephone Encounter (Signed)
Spoke with pts daughter-in-law and pt did not have angioplasty. The doctors in [place] didn't think it was necessary. She thinks pt may have had issue with her kidneys. Pt is at home in [place] and doing fine.

## 2014-04-22 DIAGNOSIS — I70219 Atherosclerosis of native arteries of extremities with intermittent claudication, unspecified extremity: Secondary | ICD-10-CM

## 2014-04-22 HISTORY — DX: Atherosclerosis of native arteries of extremities with intermittent claudication, unspecified extremity: I70.219

## 2014-05-13 ENCOUNTER — Other Ambulatory Visit: Payer: Self-pay | Admitting: Family Medicine

## 2014-05-13 DIAGNOSIS — R200 Anesthesia of skin: Secondary | ICD-10-CM

## 2014-05-20 ENCOUNTER — Ambulatory Visit
Admission: RE | Admit: 2014-05-20 | Discharge: 2014-05-20 | Disposition: A | Discharge status: Home / Self Care | Payer: Managed Care, Other (non HMO) | Source: Ambulatory Visit | Attending: Family Medicine | Admitting: Family Medicine

## 2014-05-20 DIAGNOSIS — R200 Anesthesia of skin: Secondary | ICD-10-CM

## 2014-05-23 DIAGNOSIS — Z9289 Personal history of other medical treatment: Secondary | ICD-10-CM

## 2014-05-23 HISTORY — DX: Personal history of other medical treatment: Z92.89

## 2014-05-28 ENCOUNTER — Encounter: Payer: Self-pay | Admitting: Physician Assistant

## 2014-05-29 ENCOUNTER — Ambulatory Visit (INDEPENDENT_AMBULATORY_CARE_PROVIDER_SITE_OTHER): Payer: Managed Care, Other (non HMO) | Admitting: Physician Assistant

## 2014-05-29 ENCOUNTER — Encounter: Payer: Self-pay | Admitting: Physician Assistant

## 2014-05-29 VITALS — BP 109/58 | HR 80 | Ht <= 58 in | Wt 140.0 lb

## 2014-05-29 DIAGNOSIS — E7800 Pure hypercholesterolemia, unspecified: Secondary | ICD-10-CM

## 2014-05-29 DIAGNOSIS — R0602 Shortness of breath: Secondary | ICD-10-CM

## 2014-05-29 DIAGNOSIS — I739 Peripheral vascular disease, unspecified: Secondary | ICD-10-CM

## 2014-05-29 DIAGNOSIS — I2510 Atherosclerotic heart disease of native coronary artery without angina pectoris: Secondary | ICD-10-CM

## 2014-05-29 LAB — BRAIN NATRIURETIC PEPTIDE: PRO B NATRI PEPTIDE: 393 pg/mL — AB (ref 0.0–100.0)

## 2014-05-29 NOTE — Patient Instructions (Signed)
Your physician recommends that you continue on your current medications as directed. Please refer to the Current Medication list given to you today.  Lab Today: BNP  Your physician has requested that you have an echocardiogram. Echocardiography is a painless test that uses sound waves to create images of your heart. It provides your doctor with information about the size and shape of your heart and how well your heart's chambers and valves are working. This procedure takes approximately one hour. There are no restrictions for this procedure.   You have been referred to Dr Kirke Corin or Dr.Berry for a PV consult  Your physician recommends that you schedule a follow-up appointment in: 2  Months with Dr.Varanasi

## 2014-05-29 NOTE — Progress Notes (Signed)
Cardiology Office Note    Date:  05/29/2014   ID:  Margaret [name], DOB 10/26/1933, MRN [id number]  PCP:  No primary provider on file.  Cardiologist:  Dr. Everette RankJay Varanasi      History of Present Illness: Margaret [name] is a 80 y.o. female with a history of CAD status post CABG in 2011, diabetes, HL, hypothyroidism.  Last seen by Dr. Everette RankJay Varanasi 4/14.  Review of her chart indicates that she was admitted to the hospital in [place] in May. It appears she had congestive heart failure. There was a question of whether or not she would need percutaneous intervention. It appears that she ultimately did not have PCI and there may have been an issue with her kidneys. I do not have records.  Patient was recently seen by primary care with complaints of dyspnea with exertion and intermittent claudication.  ABIs demonstrated marked peripheral arterial disease.   She is referred back for further evaluation.  Patient is here with her daughter and husband. Her daughter helps with interpretation. She typically could walk about 3 blocks without difficulty. Over the last month, she has developed calf pain after walking about one block. She notes pain in bilateral legs. Pain in her right leg is greater than the left. She describes dyspnea with exertion. This seems to be a fairly chronic symptom. She describes NYHA class 2-2b symptoms. She denies chest discomfort. She denies syncope. She denies orthopnea, PND. She does note some mild pedal edema (right greater than left). There has been no significant change.   Studies:  - LHC (5/11):  Ostial left main 60, OM1 70, circumflex 50-60, mid LAD stent occluded, mid RCA 30-40, RV marginal 90, EF 45-50%. >>> CABG  - Echo (2/14-done in [place]):  EF 65%, normal wall motion, PASP 33 mm Hg, normal diastolic function   Recent Labs/Images: No results found for requested labs within last 365 days. 05/13/14: A1c 6.9, Hgb 11.2, PLT 201, BUN 40, creatinine 1.52, potassium 5.8, ALT 13 05/28/14:  Creatinine 1.1, potassium 4.4  [place] Arterial Seg Multiple  05/20/2014   Right lower extremity: ABI -1.02; Left lower extremity: ABI - 0.61; blunted monophasic waveforms are demonstrated within the left femoral, popliteal and dorsalis pedis arteries. No definitive waveform is demonstrated within the left posterior tibial artery.   IMPRESSION: 1. Findings worrisome for at least moderate hemodynamically significant vasoocclusive disease affecting the left lower extremity on this resting examination. Further evaluation with CTA run-off could be performed as clinically indicated. 2. No definitive evidence of hemodynamically significant vasoocclusive disease affecting the right lower extremity.   Electronically Signed   By: Simonne ComeJohn  Watts M.D.   On: 05/20/2014 16:39     Wt Readings from Last 3 Encounters:  No data found for Wt     Past Medical History  Diagnosis Date  . Type 2 diabetes mellitus   . CAD (coronary artery disease)   . Hypercholesteremia   . Thyroid disorder   . Vitamin B12 deficiency     Current Outpatient Prescriptions  Medication Sig Dispense Refill  . aspirin 325 MG tablet Take 325 mg by mouth daily.      Marland Kitchen. atorvastatin (LIPITOR) 20 MG tablet Take 20 mg by mouth daily.      . clopidogrel (PLAVIX) 75 MG tablet Take 75 mg by mouth daily.      . Ferrous Sulfate (IRON) 325 (65 FE) MG TABS Take 1 tablet by mouth daily.      . furosemide (LASIX) 40 MG  tablet Take 40 mg by mouth.      . insulin aspart (NOVOLOG) 100 UNIT/ML injection 4 units at bedtime of each meal      . insulin detemir (LEVEMIR) 100 UNIT/ML injection as directed.      Marland Kitchen levothyroxine (SYNTHROID, LEVOTHROID) 100 MCG tablet Take 100 mcg by mouth daily before breakfast.      . metoprolol tartrate (LOPRESSOR) 25 MG tablet Take 25 mg by mouth 2 (two) times daily.      . potassium chloride SA (K-DUR,KLOR-CON) 20 MEQ tablet Take 20 mEq by mouth 2 (two) times daily.      . potassium chloride SA (K-DUR,KLOR-CON) 20 MEQ tablet  Take 20 mEq by mouth 2 (two) times daily.      . ranitidine (ZANTAC) 150 MG capsule Take 150 mg by mouth 2 (two) times daily.       No current facility-administered medications for this visit.     Allergies:   Penicillins   Social History:  The patient  reports that she has never smoked. She does not have any smokeless tobacco history on file. She reports that she does not drink alcohol or use illicit drugs.   Family History:  The patient's family history includes Arthritis in an other family member; Diabetes in an other family member; Ulcers in an other family member.   ROS:  Please see the history of present illness.      All other systems reviewed and negative.   PHYSICAL EXAM: VS:  BP 109/58  Pulse 80  Ht 4\' 10"  (1.473 m)  Wt 140 lb (63.504 kg)  BMI 29.27 kg/m2 Well nourished, well developed, in no acute distress HEENT: normal Neck: + JVD Cardiac:  normal S1, S2;  RRR; 2/6 systolic murmur at the RUSB Lungs:   clear to auscultation bilaterally, no wheezing, rhonchi or rales Abd: soft, nontender, no hepatomegaly Ext:  no edema Skin: warm and dry Neuro:  CNs 2-12 intact, no focal abnormalities noted  EKG:  NSR, HR 80, low-voltage, normal axis, septal Q waves, inferolateral T wave inversions, similar to prior tracing from 2014     ASSESSMENT AND PLAN:  PAD (peripheral artery disease):  She has bilateral leg pain consistent with claudication. She also describes symptoms of numbness in her feet that are likely consistent with peripheral neuropathy from diabetes. Her ABIs are significantly abnormal on the left. I will refer her to PV (Dr. Kirke Corin or Dr. Allyson Sabal).  Continue Plavix, statin. There is questionable history of CHF. Therefore, I would hold off on taking this drug for now.  Shortness of breath:  She does have some mild dyspnea on exertion. She was in [place] earlier this year with possible congestive heart failure. I will obtain an echocardiogram and a BNP. If her BNP is  significantly elevated, I will adjust her Lasix.  Acute kidney injury:  She recently had an elevated creatinine and potassium. Spironolactone was discontinued. Recent creatinine is now normal.  Coronary artery disease:  No apparent angina. Continue beta blocker, Plavix, statin.  Hypercholesteremia:  Continue statin.    Disposition:  F/u with Dr. Everette Rank in 8 weeks.    Signed, Brynda Rim, MHS 05/29/2014 9:22 AM    [facility] [address] [place], [place], [place]  [address] Phone: [phone]; Fax: [phone]

## 2014-06-05 ENCOUNTER — Other Ambulatory Visit (HOSPITAL_COMMUNITY): Payer: Managed Care, Other (non HMO)

## 2014-06-12 ENCOUNTER — Ambulatory Visit (HOSPITAL_COMMUNITY): Payer: Managed Care, Other (non HMO) | Attending: Cardiology | Admitting: Cardiology

## 2014-06-12 DIAGNOSIS — R0989 Other specified symptoms and signs involving the circulatory and respiratory systems: Secondary | ICD-10-CM | POA: Diagnosis present

## 2014-06-12 DIAGNOSIS — I519 Heart disease, unspecified: Secondary | ICD-10-CM | POA: Insufficient documentation

## 2014-06-12 DIAGNOSIS — R0602 Shortness of breath: Secondary | ICD-10-CM

## 2014-06-12 DIAGNOSIS — I2510 Atherosclerotic heart disease of native coronary artery without angina pectoris: Secondary | ICD-10-CM

## 2014-06-12 DIAGNOSIS — R0609 Other forms of dyspnea: Secondary | ICD-10-CM | POA: Diagnosis present

## 2014-06-12 NOTE — Progress Notes (Signed)
Echo performed. 

## 2014-06-14 ENCOUNTER — Encounter: Payer: Self-pay | Admitting: Physician Assistant

## 2014-06-16 ENCOUNTER — Ambulatory Visit (INDEPENDENT_AMBULATORY_CARE_PROVIDER_SITE_OTHER): Payer: Managed Care, Other (non HMO) | Admitting: Cardiovascular Disease

## 2014-06-16 ENCOUNTER — Encounter: Payer: Self-pay | Admitting: Cardiovascular Disease

## 2014-06-16 VITALS — BP 140/62 | HR 98 | Ht <= 58 in | Wt 139.8 lb

## 2014-06-16 DIAGNOSIS — I25119 Atherosclerotic heart disease of native coronary artery with unspecified angina pectoris: Secondary | ICD-10-CM

## 2014-06-16 DIAGNOSIS — I2510 Atherosclerotic heart disease of native coronary artery without angina pectoris: Secondary | ICD-10-CM

## 2014-06-16 DIAGNOSIS — R0602 Shortness of breath: Secondary | ICD-10-CM

## 2014-06-16 DIAGNOSIS — I739 Peripheral vascular disease, unspecified: Secondary | ICD-10-CM

## 2014-06-16 DIAGNOSIS — I209 Angina pectoris, unspecified: Secondary | ICD-10-CM

## 2014-06-16 NOTE — Assessment & Plan Note (Signed)
The patient reports exertional dyspnea without chest pain. Recent echocardiogram showed normal LV systolic function with inferior wall hypokinesis. I requested a pharmacologic nuclear stress test for evaluation.

## 2014-06-16 NOTE — Assessment & Plan Note (Signed)
The patient has evidence of peripheral arterial disease mainly affecting the left leg. Left common femoral pulse is barely palpable and I suspect that she has inflow disease. It is difficult to determine how much claudication she is having given that she has significant associated symptoms of arthritis mostly involving the knee joint worse on the right side than the left side. Nonetheless, she clearly has left hip and thigh discomfort with walking. I requested lower extremity arterial duplex and aortoiliac duplex for evaluation.

## 2014-06-16 NOTE — Patient Instructions (Signed)
Your physician has requested that you have a lexiscan myoview. For further information please visit [email or web address] Please follow instruction sheet, as given.  Your physician has recommended an aorto-iliac duplex.  Your physician has requested that you have a lower extremity arterial duplex. This test is an ultrasound of the arteries in the legs. It looks at arterial blood flow in the legs. Allow one hour for Lower Arterial scans. There are no restrictions or special instructions  Your physician recommends that you schedule a follow-up appointment in: 1 MONTH with Dr Kirke Corin  Your physician recommends that you continue on your current medications as directed. Please refer to the Current Medication list given to you today.

## 2014-06-16 NOTE — Progress Notes (Signed)
Primary cardiologist: Dr. Eldridge Dace  HPI  This is a 80 year old female who was referred for evaluation of PAD. She is accompanied by husband and son who helped with interpretation. She has known  history of CAD status post CABG in 2011, diabetes, HL, hypothyroidism.  She was admitted to the hospital in [place] in May. It appears she had congestive heart failure and renal failure.   Patient was recently seen by primary care with complaints of dyspnea with exertion and intermittent claudication.  ABIs was moderately reduced on left (0.61).   She complains of significant pain in both knee joints worse on the right. She does complain of weakness in her left leg but is not very active.  She denies chest pain but continues to have exertional dyspnea without orthopnea. Recent BNP was mildly elevated. Echo showed normal EF with inferior wall hypokinesis.    Allergies  Allergen Reactions  . Penicillins      Current Outpatient Prescriptions on File Prior to Visit  Medication Sig Dispense Refill  . carvedilol (COREG) 6.25 MG tablet Take 6.25 mg by mouth 2 (two) times daily with a meal.      . clopidogrel (PLAVIX) 75 MG tablet Take 75 mg by mouth daily.      . furosemide (LASIX) 40 MG tablet Take 40 mg by mouth daily.       . NON FORMULARY Take 100 mg by mouth daily. URIBID      . NON FORMULARY Take 30 mg by mouth daily. RECLIDE-MR      . NON FORMULARY Take 125 mcg by mouth daily. THYROXINE      . rosuvastatin (CRESTOR) 10 MG tablet Take 10 mg by mouth daily.      . sitaGLIPtin-metformin (JANUMET) 50-500 MG per tablet Take 1 tablet by mouth daily.       No current facility-administered medications on file prior to visit.     Past Medical History  Diagnosis Date  . Type 2 diabetes mellitus   . CAD (coronary artery disease)   . Hypercholesteremia   . Thyroid disorder   . Vitamin B12 deficiency   . Hx of echocardiogram     Echo (8/15):  EF 50-55%, inf-lat HK, Gr 2 DD, very mild AS (mean 10  mmHg), mild MR, PASP 36 mmHg; calcified density in LA (suggest TEE)     Past Surgical History  Procedure Laterality Date  . Total knee arthroplasty    . Cardiac catheterization      with stent placement  . Cataract extraction       Family History  Problem Relation Age of Onset  . Ulcers    . Diabetes    . Arthritis    . Heart attack Neg Hx   . Diabetes    . Diabetes Father      History   Social History  . Marital Status: Single    Spouse Name: N/A    Number of Children: N/A  . Years of Education: N/A   Occupational History  . Not on file.   Social History Main Topics  . Smoking status: Never Smoker   . Smokeless tobacco: Not on file  . Alcohol Use: No  . Drug Use: No  . Sexual Activity: Not on file   Other Topics Concern  . Not on file   Social History Narrative  . No narrative on file     ROS A 10 point review of system was performed. It is negative other than  that mentioned in the history of present illness.   PHYSICAL EXAM   BP 140/62  Pulse 98  Ht 4\' 10"  (1.473 m)  Wt 139 lb 12.8 oz (63.413 kg)  BMI 29.23 kg/m2 Constitutional: She is oriented to person, place, and time. She appears well-developed and well-nourished. No distress.  HENT: No nasal discharge.  Head: Normocephalic and atraumatic.  Eyes: Pupils are equal and round. No discharge.  Neck: Normal range of motion. Neck supple. No JVD present. No thyromegaly present.  Cardiovascular: Normal rate, regular rhythm, normal heart sounds. Exam reveals no gallop and no friction rub. No murmur heard.  Pulmonary/Chest: Effort normal and breath sounds normal. No stridor. No respiratory distress. She has no wheezes. She has no rales. She exhibits no tenderness.  Abdominal: Soft. Bowel sounds are normal. She exhibits no distension. There is no tenderness. There is no rebound and no guarding.  Musculoskeletal: Normal range of motion. She exhibits mild edema and no tenderness.  Neurological: She is  alert and oriented to person, place, and time. Coordination normal.  Skin: Skin is warm and dry. No rash noted. She is not diaphoretic. No erythema. No pallor.  Psychiatric: She has a normal mood and affect. Her behavior is normal. Judgment and thought content normal.  Vascular: Femoral: normal on right and very faint on left. Distal pulses are not palpable.      ASSESSMENT AND PLAN

## 2014-06-19 NOTE — Progress Notes (Signed)
Patient ID: Margaret [name], female   DOB: 02/16/1934, 80 y.o.   MRN: [id number] Cancel myoview  Received: Today     Wilhemina Cash, RN            Was schedule for stress test on 06-23-14- per Canceled via automated reminder system/ Spoke with daughter and she stated that patient will be out of town for several weeks. She will call back and schedule when she come back to town. Will defer x 2 months.

## 2014-06-23 ENCOUNTER — Other Ambulatory Visit (HOSPITAL_COMMUNITY): Payer: Self-pay | Admitting: *Deleted

## 2014-06-23 ENCOUNTER — Encounter (HOSPITAL_COMMUNITY): Payer: Managed Care, Other (non HMO)

## 2014-06-23 DIAGNOSIS — I739 Peripheral vascular disease, unspecified: Secondary | ICD-10-CM

## 2014-06-26 ENCOUNTER — Telehealth: Payer: Self-pay | Admitting: *Deleted

## 2014-06-26 NOTE — Telephone Encounter (Signed)
s/w pt's daughter due to language barrier. Daughter notified about echo results with verbal understanding, she asked for results to be faxed to PCP, I told her I will take care of that for her.

## 2014-07-01 ENCOUNTER — Encounter (HOSPITAL_COMMUNITY): Payer: Managed Care, Other (non HMO)

## 2014-07-07 ENCOUNTER — Ambulatory Visit: Payer: Managed Care, Other (non HMO) | Admitting: Cardiovascular Disease

## 2014-07-08 ENCOUNTER — Telehealth: Payer: Self-pay | Admitting: Cardiovascular Disease

## 2014-07-08 NOTE — Telephone Encounter (Addendum)
Tests ordered: Myoview, LEA and aorto-iliac duplex  I attempted to reach the pt's son but the phone number does not allow voicemail messages.

## 2014-07-08 NOTE — Telephone Encounter (Signed)
New Message  Pt son requests a call back to discuss what the scheduled test are for and why does the pt need them. Please call back to discuss

## 2014-07-09 NOTE — Telephone Encounter (Signed)
I spoke with the pt's son and made him aware of reason for testing.

## 2014-07-17 ENCOUNTER — Ambulatory Visit (HOSPITAL_BASED_OUTPATIENT_CLINIC_OR_DEPARTMENT_OTHER): Payer: Managed Care, Other (non HMO) | Admitting: Radiology

## 2014-07-17 ENCOUNTER — Encounter (HOSPITAL_COMMUNITY): Payer: Self-pay | Admitting: Emergency Medicine

## 2014-07-17 ENCOUNTER — Inpatient Hospital Stay (HOSPITAL_COMMUNITY)
Admission: EM | Admit: 2014-07-17 | Discharge: 2014-07-24 | DRG: 286 | Disposition: A | Discharge status: Home Health | Payer: Managed Care, Other (non HMO) | Attending: Interventional Cardiology | Admitting: Interventional Cardiology

## 2014-07-17 ENCOUNTER — Emergency Department (HOSPITAL_COMMUNITY): Payer: Managed Care, Other (non HMO)

## 2014-07-17 VITALS — BP 139/76 | HR 112 | Ht <= 58 in | Wt 142.0 lb

## 2014-07-17 DIAGNOSIS — I340 Nonrheumatic mitral (valve) insufficiency: Secondary | ICD-10-CM | POA: Diagnosis present

## 2014-07-17 DIAGNOSIS — E785 Hyperlipidemia, unspecified: Secondary | ICD-10-CM | POA: Diagnosis present

## 2014-07-17 DIAGNOSIS — I5033 Acute on chronic diastolic (congestive) heart failure: Secondary | ICD-10-CM | POA: Diagnosis present

## 2014-07-17 DIAGNOSIS — D649 Anemia, unspecified: Secondary | ICD-10-CM | POA: Diagnosis not present

## 2014-07-17 DIAGNOSIS — J9602 Acute respiratory failure with hypercapnia: Secondary | ICD-10-CM | POA: Diagnosis present

## 2014-07-17 DIAGNOSIS — E119 Type 2 diabetes mellitus without complications: Secondary | ICD-10-CM

## 2014-07-17 DIAGNOSIS — I25710 Atherosclerosis of autologous vein coronary artery bypass graft(s) with unstable angina pectoris: Secondary | ICD-10-CM | POA: Diagnosis present

## 2014-07-17 DIAGNOSIS — I2510 Atherosclerotic heart disease of native coronary artery without angina pectoris: Secondary | ICD-10-CM | POA: Diagnosis present

## 2014-07-17 DIAGNOSIS — Z79899 Other long term (current) drug therapy: Secondary | ICD-10-CM | POA: Diagnosis not present

## 2014-07-17 DIAGNOSIS — E079 Disorder of thyroid, unspecified: Secondary | ICD-10-CM | POA: Diagnosis present

## 2014-07-17 DIAGNOSIS — I272 Other secondary pulmonary hypertension: Secondary | ICD-10-CM | POA: Diagnosis present

## 2014-07-17 DIAGNOSIS — Z96659 Presence of unspecified artificial knee joint: Secondary | ICD-10-CM | POA: Diagnosis present

## 2014-07-17 DIAGNOSIS — J9600 Acute respiratory failure, unspecified whether with hypoxia or hypercapnia: Secondary | ICD-10-CM

## 2014-07-17 DIAGNOSIS — R0600 Dyspnea, unspecified: Secondary | ICD-10-CM | POA: Diagnosis present

## 2014-07-17 DIAGNOSIS — R0603 Acute respiratory distress: Secondary | ICD-10-CM

## 2014-07-17 DIAGNOSIS — E872 Acidosis: Secondary | ICD-10-CM | POA: Diagnosis not present

## 2014-07-17 DIAGNOSIS — Z955 Presence of coronary angioplasty implant and graft: Secondary | ICD-10-CM | POA: Diagnosis not present

## 2014-07-17 DIAGNOSIS — I959 Hypotension, unspecified: Secondary | ICD-10-CM | POA: Diagnosis not present

## 2014-07-17 DIAGNOSIS — R0602 Shortness of breath: Secondary | ICD-10-CM

## 2014-07-17 DIAGNOSIS — I70219 Atherosclerosis of native arteries of extremities with intermittent claudication, unspecified extremity: Secondary | ICD-10-CM

## 2014-07-17 DIAGNOSIS — N179 Acute kidney failure, unspecified: Secondary | ICD-10-CM | POA: Diagnosis not present

## 2014-07-17 DIAGNOSIS — E538 Deficiency of other specified B group vitamins: Secondary | ICD-10-CM | POA: Diagnosis present

## 2014-07-17 DIAGNOSIS — I5030 Unspecified diastolic (congestive) heart failure: Secondary | ICD-10-CM | POA: Diagnosis present

## 2014-07-17 DIAGNOSIS — I509 Heart failure, unspecified: Secondary | ICD-10-CM

## 2014-07-17 DIAGNOSIS — I70212 Atherosclerosis of native arteries of extremities with intermittent claudication, left leg: Secondary | ICD-10-CM | POA: Diagnosis present

## 2014-07-17 DIAGNOSIS — J9601 Acute respiratory failure with hypoxia: Secondary | ICD-10-CM | POA: Diagnosis present

## 2014-07-17 DIAGNOSIS — I2584 Coronary atherosclerosis due to calcified coronary lesion: Secondary | ICD-10-CM | POA: Diagnosis present

## 2014-07-17 DIAGNOSIS — E1159 Type 2 diabetes mellitus with other circulatory complications: Secondary | ICD-10-CM

## 2014-07-17 DIAGNOSIS — E1151 Type 2 diabetes mellitus with diabetic peripheral angiopathy without gangrene: Secondary | ICD-10-CM | POA: Diagnosis present

## 2014-07-17 DIAGNOSIS — I501 Left ventricular failure: Secondary | ICD-10-CM

## 2014-07-17 DIAGNOSIS — I5032 Chronic diastolic (congestive) heart failure: Secondary | ICD-10-CM | POA: Diagnosis present

## 2014-07-17 DIAGNOSIS — N289 Disorder of kidney and ureter, unspecified: Secondary | ICD-10-CM

## 2014-07-17 HISTORY — DX: Disorder of thyroid, unspecified: E07.9

## 2014-07-17 HISTORY — DX: Hyperlipidemia, unspecified: E78.5

## 2014-07-17 HISTORY — DX: Atherosclerosis of native arteries of extremities with intermittent claudication, unspecified extremity: I70.219

## 2014-07-17 HISTORY — DX: Left ventricular failure, unspecified: I50.1

## 2014-07-17 HISTORY — DX: Respiratory arrest: R09.2

## 2014-07-17 LAB — URINALYSIS, ROUTINE W REFLEX MICROSCOPIC
Bilirubin Urine: NEGATIVE
GLUCOSE, UA: 100 mg/dL — AB
HGB URINE DIPSTICK: NEGATIVE
Ketones, ur: NEGATIVE mg/dL
Leukocytes, UA: NEGATIVE
Nitrite: NEGATIVE
PH: 6 (ref 5.0–8.0)
Protein, ur: NEGATIVE mg/dL
Specific Gravity, Urine: 1.009 (ref 1.005–1.030)
Urobilinogen, UA: 0.2 mg/dL (ref 0.0–1.0)

## 2014-07-17 LAB — CBC WITH DIFFERENTIAL/PLATELET
Basophils Absolute: 0.1 10*3/uL (ref 0.0–0.1)
Basophils Relative: 1 % (ref 0–1)
EOS PCT: 3 % (ref 0–5)
Eosinophils Absolute: 0.4 10*3/uL (ref 0.0–0.7)
HEMATOCRIT: 35.5 % — AB (ref 36.0–46.0)
HEMOGLOBIN: 11.3 g/dL — AB (ref 12.0–15.0)
LYMPHS ABS: 5.8 10*3/uL — AB (ref 0.7–4.0)
LYMPHS PCT: 33 % (ref 12–46)
MCH: 30.1 pg (ref 26.0–34.0)
MCHC: 31.8 g/dL (ref 30.0–36.0)
MCV: 94.7 fL (ref 78.0–100.0)
MONO ABS: 0.8 10*3/uL (ref 0.1–1.0)
MONOS PCT: 4 % (ref 3–12)
NEUTROS ABS: 10.4 10*3/uL — AB (ref 1.7–7.7)
Neutrophils Relative %: 59 % (ref 43–77)
Platelets: 291 10*3/uL (ref 150–400)
RBC: 3.75 MIL/uL — AB (ref 3.87–5.11)
RDW: 12.9 % (ref 11.5–15.5)
WBC: 17.5 10*3/uL — ABNORMAL HIGH (ref 4.0–10.5)

## 2014-07-17 LAB — GLUCOSE, CAPILLARY: GLUCOSE-CAPILLARY: 169 mg/dL — AB (ref 70–99)

## 2014-07-17 LAB — TROPONIN I
Troponin I: <0.3 ng/mL (ref <0.30)
Troponin I: <0.3 ng/mL (ref <0.30)

## 2014-07-17 LAB — POCT I-STAT 3, ART BLOOD GAS (G3+)
Acid-base deficit: 2 mmol/L (ref 0.0–2.0)
Bicarbonate: 24.2 mEq/L — ABNORMAL HIGH (ref 20.0–24.0)
O2 SAT: 98 %
TCO2: 26 mmol/L (ref 0–100)
pCO2 arterial: 44.9 mmHg (ref 35.0–45.0)
pH, Arterial: 7.338 — ABNORMAL LOW (ref 7.350–7.450)
pO2, Arterial: 109 mmHg — ABNORMAL HIGH (ref 80.0–100.0)

## 2014-07-17 LAB — I-STAT ARTERIAL BLOOD GAS, ED
ACID-BASE DEFICIT: 3 mmol/L — AB (ref 0.0–2.0)
Acid-base deficit: 5 mmol/L — ABNORMAL HIGH (ref 0.0–2.0)
BICARBONATE: 23.5 meq/L (ref 20.0–24.0)
BICARBONATE: 24.3 meq/L — AB (ref 20.0–24.0)
O2 Saturation: 94 %
O2 Saturation: 99 %
PH ART: 7.189 — AB (ref 7.350–7.450)
TCO2: 25 mmol/L (ref 0–100)
TCO2: 26 mmol/L (ref 0–100)
pCO2 arterial: 61.8 mmHg (ref 35.0–45.0)
pCO2 arterial: 52.3 mmHg — ABNORMAL HIGH (ref 35.0–45.0)
pH, Arterial: 7.274 — ABNORMAL LOW (ref 7.350–7.450)
pO2, Arterial: 87 mmHg (ref 80.0–100.0)
pO2, Arterial: 165 mmHg — ABNORMAL HIGH (ref 80.0–100.0)

## 2014-07-17 LAB — BASIC METABOLIC PANEL
ANION GAP: 14 (ref 5–15)
BUN: 24 mg/dL — AB (ref 6–23)
CHLORIDE: 100 meq/L (ref 96–112)
CO2: 22 meq/L (ref 19–32)
Calcium: 9.2 mg/dL (ref 8.4–10.5)
Creatinine, Ser: 1.24 mg/dL — ABNORMAL HIGH (ref 0.50–1.10)
GFR calc Af Amer: 47 mL/min — ABNORMAL LOW (ref >90)
GFR calc non Af Amer: 41 mL/min — ABNORMAL LOW (ref >90)
Glucose, Bld: 315 mg/dL — ABNORMAL HIGH (ref 70–99)
Potassium: 5.1 mEq/L (ref 3.7–5.3)
Sodium: 136 mEq/L — ABNORMAL LOW (ref 137–147)

## 2014-07-17 LAB — I-STAT CG4 LACTIC ACID, ED: LACTIC ACID, VENOUS: 1.84 mmol/L (ref 0.5–2.2)

## 2014-07-17 LAB — MRSA PCR SCREENING: MRSA by PCR: NEGATIVE

## 2014-07-17 LAB — TSH: TSH: 2.73 u[IU]/mL (ref 0.350–4.500)

## 2014-07-17 LAB — CBG MONITORING, ED
GLUCOSE-CAPILLARY: 223 mg/dL — AB (ref 70–99)
Glucose-Capillary: 317 mg/dL — ABNORMAL HIGH (ref 70–99)

## 2014-07-17 LAB — PRO B NATRIURETIC PEPTIDE
Pro B Natriuretic peptide (BNP): 1966 pg/mL — ABNORMAL HIGH (ref 0–450)
Pro B Natriuretic peptide (BNP): 3446 pg/mL — ABNORMAL HIGH (ref 0–450)

## 2014-07-17 MED ORDER — NITROGLYCERIN 2 % TD OINT
1.0000 [in_us] | TOPICAL_OINTMENT | Freq: Once | TRANSDERMAL | Status: AC
  Administered 2014-07-17: 1 [in_us] via TOPICAL
  Filled 2014-07-17: qty 1

## 2014-07-17 MED ORDER — FUROSEMIDE 10 MG/ML IJ SOLN
40.0000 mg | Freq: Once | INTRAMUSCULAR | Status: AC
  Administered 2014-07-17: 40 mg via INTRAVENOUS
  Filled 2014-07-17: qty 4

## 2014-07-17 MED ORDER — LORAZEPAM 2 MG/ML IJ SOLN
0.5000 mg | Freq: Once | INTRAMUSCULAR | Status: AC
  Administered 2014-07-17: 0.5 mg via INTRAVENOUS
  Filled 2014-07-17: qty 1

## 2014-07-17 MED ORDER — METHYLPREDNISOLONE SODIUM SUCC 125 MG IJ SOLR
80.0000 mg | Freq: Once | INTRAMUSCULAR | Status: AC
  Administered 2014-07-17: 80 mg via INTRAVENOUS
  Filled 2014-07-17: qty 2

## 2014-07-17 MED ORDER — TECHNETIUM TC 99M SESTAMIBI GENERIC - CARDIOLITE
11.0000 | Freq: Once | INTRAVENOUS | Status: AC | PRN
  Administered 2014-07-17: 11 via INTRAVENOUS

## 2014-07-17 MED ORDER — REGADENOSON 0.4 MG/5ML IV SOLN
0.4000 mg | Freq: Once | INTRAVENOUS | Status: AC
Start: 2014-07-17 — End: 2014-07-17
  Administered 2014-07-17: 0.4 mg via INTRAVENOUS

## 2014-07-17 MED ORDER — FAMOTIDINE IN NACL 20-0.9 MG/50ML-% IV SOLN
20.0000 mg | Freq: Once | INTRAVENOUS | Status: AC
  Administered 2014-07-17: 20 mg via INTRAVENOUS
  Filled 2014-07-17: qty 50

## 2014-07-17 MED ORDER — LEVOFLOXACIN IN D5W 500 MG/100ML IV SOLN: 500.0000 mg | INTRAVENOUS | Status: DC

## 2014-07-17 MED ORDER — SODIUM CHLORIDE 0.9 % IJ SOLN
3.0000 mL | Freq: Two times a day (BID) | INTRAMUSCULAR | Status: DC
  Administered 2014-07-17 – 2014-07-19 (×4): 3 mL via INTRAVENOUS
  Administered 2014-07-19: 11:00:00 via INTRAVENOUS
  Administered 2014-07-20 – 2014-07-21 (×3): 3 mL via INTRAVENOUS

## 2014-07-17 MED ORDER — LEVOTHYROXINE SODIUM 125 MCG PO TABS
125.0000 ug | ORAL_TABLET | Freq: Every day | ORAL | Status: DC
Start: 2014-07-18 — End: 2014-07-24
  Administered 2014-07-18 – 2014-07-24 (×7): 125 ug via ORAL
  Filled 2014-07-17 (×8): qty 1

## 2014-07-17 MED ORDER — HEPARIN SODIUM (PORCINE) 5000 UNIT/ML IJ SOLN
5000.0000 [IU] | Freq: Three times a day (TID) | INTRAMUSCULAR | Status: DC
  Administered 2014-07-17 – 2014-07-21 (×12): 5000 [IU] via SUBCUTANEOUS
  Filled 2014-07-17 (×14): qty 1

## 2014-07-17 MED ORDER — INSULIN ASPART 100 UNIT/ML ~~LOC~~ SOLN
0.0000 [IU] | Freq: Three times a day (TID) | SUBCUTANEOUS | Status: DC
  Administered 2014-07-17: 3 [IU] via SUBCUTANEOUS
  Administered 2014-07-18: 1 [IU] via SUBCUTANEOUS
  Administered 2014-07-18: 2 [IU] via SUBCUTANEOUS
  Administered 2014-07-18: 1 [IU] via SUBCUTANEOUS
  Administered 2014-07-19 (×2): 2 [IU] via SUBCUTANEOUS
  Administered 2014-07-19: 7 [IU] via SUBCUTANEOUS
  Administered 2014-07-20: 3 [IU] via SUBCUTANEOUS
  Administered 2014-07-20: 2 [IU] via SUBCUTANEOUS
  Administered 2014-07-20: 09:00:00 via SUBCUTANEOUS
  Administered 2014-07-21 (×2): 3 [IU] via SUBCUTANEOUS
  Filled 2014-07-17: qty 1

## 2014-07-17 MED ORDER — NITROGLYCERIN IN D5W 200-5 MCG/ML-% IV SOLN
2.0000 ug/min | Freq: Once | INTRAVENOUS | Status: AC
  Administered 2014-07-17: 5 ug/min via INTRAVENOUS
  Filled 2014-07-17: qty 250

## 2014-07-17 MED ORDER — TECHNETIUM TC 99M SESTAMIBI GENERIC - CARDIOLITE
33.0000 | Freq: Once | INTRAVENOUS | Status: AC | PRN
Start: 2014-07-17 — End: 2014-07-17
  Administered 2014-07-17: 33 via INTRAVENOUS

## 2014-07-17 MED ORDER — CARVEDILOL 6.25 MG PO TABS
6.2500 mg | ORAL_TABLET | Freq: Two times a day (BID) | ORAL | Status: DC
  Administered 2014-07-18 – 2014-07-22 (×7): 6.25 mg via ORAL
  Filled 2014-07-17 (×12): qty 1

## 2014-07-17 MED ORDER — FUROSEMIDE 10 MG/ML IJ SOLN
40.0000 mg | Freq: Two times a day (BID) | INTRAMUSCULAR | Status: AC
  Administered 2014-07-17 – 2014-07-18 (×3): 40 mg via INTRAVENOUS
  Filled 2014-07-17 (×4): qty 4

## 2014-07-17 MED ORDER — INFLUENZA VAC SPLIT QUAD 0.5 ML IM SUSY
0.5000 mL | PREFILLED_SYRINGE | INTRAMUSCULAR | Status: AC
  Administered 2014-07-19: 0.5 mL via INTRAMUSCULAR
  Filled 2014-07-17: qty 0.5

## 2014-07-17 MED ORDER — SODIUM CHLORIDE 0.9 % IV SOLN: 250.0000 mL | INTRAVENOUS | Status: DC | PRN

## 2014-07-17 MED ORDER — VANCOMYCIN HCL IN DEXTROSE 1-5 GM/200ML-% IV SOLN
1000.0000 mg | Freq: Once | INTRAVENOUS | Status: AC
  Administered 2014-07-17: 1000 mg via INTRAVENOUS
  Filled 2014-07-17: qty 200

## 2014-07-17 MED ORDER — LEVOFLOXACIN IN D5W 750 MG/150ML IV SOLN
750.0000 mg | Freq: Once | INTRAVENOUS | Status: AC
  Administered 2014-07-17: 750 mg via INTRAVENOUS
  Filled 2014-07-17: qty 150

## 2014-07-17 MED ORDER — ASPIRIN EC 81 MG PO TBEC
81.0000 mg | DELAYED_RELEASE_TABLET | Freq: Every day | ORAL | Status: DC
  Administered 2014-07-17 – 2014-07-24 (×8): 81 mg via ORAL
  Filled 2014-07-17 (×8): qty 1

## 2014-07-17 MED ORDER — CLOPIDOGREL BISULFATE 75 MG PO TABS
75.0000 mg | ORAL_TABLET | Freq: Every day | ORAL | Status: DC
  Administered 2014-07-17 – 2014-07-22 (×6): 75 mg via ORAL
  Filled 2014-07-17 (×6): qty 1

## 2014-07-17 MED ORDER — ONDANSETRON HCL 4 MG/2ML IJ SOLN
4.0000 mg | Freq: Four times a day (QID) | INTRAMUSCULAR | Status: DC | PRN
  Administered 2014-07-22: 4 mg via INTRAVENOUS
  Filled 2014-07-17: qty 2

## 2014-07-17 MED ORDER — ATORVASTATIN CALCIUM 20 MG PO TABS
20.0000 mg | ORAL_TABLET | Freq: Every day | ORAL | Status: DC
  Administered 2014-07-18 – 2014-07-23 (×6): 20 mg via ORAL
  Filled 2014-07-17 (×9): qty 1

## 2014-07-17 MED ORDER — PNEUMOCOCCAL VAC POLYVALENT 25 MCG/0.5ML IJ INJ
0.5000 mL | INJECTION | INTRAMUSCULAR | Status: AC
  Administered 2014-07-24: 0.5 mL via INTRAMUSCULAR
  Filled 2014-07-17 (×3): qty 0.5

## 2014-07-17 MED ORDER — DIPHENHYDRAMINE HCL 50 MG/ML IJ SOLN
25.0000 mg | Freq: Once | INTRAMUSCULAR | Status: AC
  Administered 2014-07-17: 25 mg via INTRAVENOUS
  Filled 2014-07-17: qty 1

## 2014-07-17 MED ORDER — METOPROLOL TARTRATE 1 MG/ML IV SOLN
5.0000 mg | INTRAVENOUS | Status: DC | PRN
  Administered 2014-07-17: 5 mg via INTRAVENOUS
  Filled 2014-07-17: qty 5

## 2014-07-17 MED ORDER — SODIUM CHLORIDE 0.9 % IJ SOLN: 3.0000 mL | INTRAMUSCULAR | Status: DC | PRN

## 2014-07-17 MED ORDER — VANCOMYCIN HCL IN DEXTROSE 750-5 MG/150ML-% IV SOLN: 750.0000 mg | INTRAVENOUS | Status: DC

## 2014-07-17 MED ORDER — ACETAMINOPHEN 325 MG PO TABS
650.0000 mg | ORAL_TABLET | ORAL | Status: DC | PRN
  Administered 2014-07-19: 650 mg via ORAL
  Filled 2014-07-17: qty 2

## 2014-07-17 NOTE — Progress Notes (Signed)
[facility] SITE 3 NUCLEAR MED [address] [place], [place] [address] [phone]    Cardiology Nuclear Med Study  Margaret [name] is a 80 y.o. female     MRN : [id number]     DOB: 07-Dec-1933  Procedure Date: 07/17/2014  Nuclear Med Background Indication for Stress Test:  Evaluation for Ischemia and follow up CAD History:  CAD; 2005 MPI-ischemia, EF 53% Cardiac Risk Factors: NIDDM  Symptoms:  DOE and SOB   Nuclear Pre-Procedure Caffeine/Decaff Intake:  9:00pm NPO After: 9:00pm   Lungs:  clear O2 Sat: 86-90% on room air. IV 0.9% NS with Angio Cath:  22g  IV Site: R Antecubital  IV Started by:  Frederick Peers, EMT-P  Chest Size (in):  38 Cup Size: D  Height:  (1.473 m)  Weight:  142 lb (64.411 kg)  BMI:  Body mass index is 29.69 kg/(m^2). Tech Comments:  n/a    Nuclear Med Study 1 or 2 day study: 1 day  Stress Test Type:  Eugenie Birks  Reading MD: Olga Millers, MD  Order Authorizing Provider:  Judie Petit. Kirke Corin, MD  Resting Radionuclide: Technetium 70m Sestamibi  Resting Radionuclide Dose: 11.0 mCi   Stress Radionuclide:  Technetium 48m Sestamibi  Stress Radionuclide Dose: 33.0 mCi           Stress Protocol Rest HR: 112 Stress HR: 122  Rest BP: 139/76 Stress BP: 140/83  Exercise Time (min): n/a METS: n/a           Dose of Adenosine (mg):  n/a Dose of Lexiscan: 0.4 mg  Dose of Atropine (mg): n/a Dose of Dobutamine: n/a mcg/kg/min (at max HR)  Stress Test Technologist: Emeri Estill Chimes, BS-ES  Nuclear Technologist:  Doyne Keel, CNMT     Rest Procedure:  Myocardial perfusion imaging was performed at rest 45 minutes following the intravenous administration of Technetium 12m Sestamibi. Rest ECG: Sinus tachycardia, STD and inverted T waves in the inferolateral leads.  Stress Procedure:  The patient received IV Lexiscan 0.4 mg over 15-seconds.  Technetium 61m Sestamibi injected at 30-seconds.  Quantitative spect images were obtained after a 45 minute delay.  Coming into  the stress area the patient was very anxious.  During the infusion of Lexiscan she complained of SOB and had loose bowels.  By the end of recovery the patient stated she was feeling better.  Stress ECG: No significant change from baseline ECG  QPS Raw Data Images:  There is interference from nuclear activity from structures below the diaphragm. This does not affect the ability to read the study. Stress Images:  Not performed. Rest Images: There is decreased uptake in the apical anterior, mid and basal anteroseptal and inferolateral walls.  Subtraction (SDS):  Can't be assessed with rest images only. Transient Ischemic Dilatation (Normal <1.22):  n/a Lung/Heart Ratio (Normal <0.45):  0.36  Quantitative Gated Spect Images QGS EDV:  n/a  QGS ESV:  n/a   Impression Exercise Capacity:  Lexiscan with no exercise. BP Response:  Normal blood pressure response. Clinical Symptoms:  Respiratory distress post regadenoson injection, transfered to ER.  ECG Impression:  No significant ECG changes with Lexiscan. Comparison with Prior Nuclear Study: No images to compare.  Overall Impression:  Only rest images performed with decreased uptake in the apical anterior, mid and basal anteroseptal and inferolateral walls. This might represent either prior myocardial infarction or artifacts. We are unable to distinguish those without gated images.   LV Ejection Fraction: n/a.  LV Wall Motion:  [name] 07/22/2014

## 2014-07-17 NOTE — ED Notes (Signed)
EDP and resp at bedside attempting to obtain and ABG.

## 2014-07-17 NOTE — ED Notes (Signed)
BiPAP applied respiratory and Dr. Wilkie Aye at bedside. Pt appears very anxious. Ativan ordered. Will continue to assess.

## 2014-07-17 NOTE — Progress Notes (Signed)
Noted pt to be dyssynchronous on PS bipap.  Changed mode to [facility] Bipap, pt appears to be more comfortable, improved min vol, and improved tidal volume.  No resp distress currently noted.  Pt appears to be tol bipap well presently.  Sat 100%

## 2014-07-17 NOTE — Progress Notes (Signed)
Spoke with Dr. Kem Kays, and recommended the current Bipap settings of NIV PC 14/6 RR14, and 50%.

## 2014-07-17 NOTE — ED Notes (Signed)
Nitro paste taken off chest and underlying skin cleaned per order from Dr. Wilkie Aye because nitro drip will be started.

## 2014-07-17 NOTE — Progress Notes (Signed)
ANTIBIOTIC CONSULT NOTE - INITIAL  Pharmacy Consult for vancomycin and levaquin Indication: pneumonia  Allergies  Allergen Reactions  . Penicillins     Patient Measurements: Height: 4\' 10"  (147.3 cm) Weight: 139 lb (63.05 kg) IBW/kg (Calculated) : 40.9  Vital Signs: Temp: 97.1 F (36.2 C) (09/25 1330) Temp src: Rectal (09/25 1330) BP: 116/86 mmHg (09/25 1500) Pulse Rate: 99 (09/25 1500) Intake/Output from previous day:   Intake/Output from this shift:    Labs:  Recent Labs  07/17/14 1253  WBC 17.5*  HGB 11.3*  PLT 291  CREATININE 1.24*   Estimated Creatinine Clearance: 29.4 ml/min (by C-G formula based on Cr of 1.24). No results found for this basename: VANCOTROUGH, VANCOPEAK, VANCORANDOM, GENTTROUGH, GENTPEAK, GENTRANDOM, TOBRATROUGH, TOBRAPEAK, TOBRARND, AMIKACINPEAK, AMIKACINTROU, AMIKACIN,  in the last 72 hours   Microbiology: No results found for this or any previous visit (from the past 720 hour(s)).  Medical History: Past Medical History  Diagnosis Date  . CAD S/P percutaneous coronary angioplasty 2005    prior LAD PCI - in 2005, LAD stent 100% occluded (mid LAD).  Marland Kitchen Left main coronary artery disease 03/2010    Ostial left main 60, OM1 70, circumflex 50-60, mid LAD stent occluded, mid RCA 30-40, RV marginal 90, EF 45-50% --> CABG - ([facility]) LIMA-LAD, SVG-D1, sSVG-OM1-OM2,  . Chronic diastolic CHF (congestive heart failure), NYHA class 2     Grade 2 Diastolic Dysfunction on Echo (hospitalized in [place] 01/2014 with CHF, no PTCA performed)  . Hx of echocardiogram 05/2014    EF 50-55%, inf-lat HK, Gr 2 DD, very mild AS (mean 10 mmHg), mild MR, PASP 36 mmHg; calcified density in LA (suggest TEE)  . Atherosclerotic peripheral vascular disease with intermittent claudication 04/2014    LEA Dopplers: RABI 1.02/ LABI: 0.61 (L SFA, Pop & DP blunted wave forms) -- correlates with L leg claudication  . Type 2 diabetes mellitus   . Hyperlipidemia with target LDL less than  70   . Vitamin B12 deficiency   . Thyroid disease    Assessment: 89 YOF presented with respiratory distress, worsening mental status and leukocytosis. Pharmacy is consulted to start vancomycin and Levaquire for pneumonia since pt. has documented penicillin allergy. Blood and urine cultures are ordered, scr 1.24, baseline unknown, est, crcl ~ 25-30 ml/min. Vancomycin 1g and levaquin 750 mg were ordered while she was in the ED.  Goal of Therapy:  Vancomycin trough level 15-20 mcg/ml  Plan:  - Vancomycin 750mg  IVQ 24 hrs, next dose 9/26 at 1600 - Levaquin 500 mg IV Q 48 hrs, next dose 9/27 at 1800 - F/u cultures and monitor renal function - Vancomycin trough at steady state  Bayard Hugger, PharmD, BCPS  Clinical Pharmacist  Pager: [phone]   07/17/2014,3:15 PM

## 2014-07-17 NOTE — ED Notes (Addendum)
Pts family member requests to be called with any updates-  DaughterDonnella Bi-  Cell: [phone] Home: [phone]

## 2014-07-17 NOTE — Progress Notes (Signed)
Patient arrived for her nuclear stress test.  Patient was not in any distress.  Patient was given Lexiscan as the stress agent.  During the infusion the patient did have some SOB but nothing unusual with family member present (daughter).  Their was a language barrier but the patients daughter and interpreter assisted with communication.  The patient stated she was feeling better and symptoms were resolving.  She had to go to the bathroom and on the way down the hall to the bathroom, assisted by her daughter, the patient became extremely short of breath and anxious.  She was assisted to a chair and her oxygen level was checked and registerd 74%.  Administered 4L 02 via nasal cannula and SATS came up to 87%.  Switched patient to face mask and increased to 15LPM of 02.  Her SATS registered 99 to 100%. We had her sit for a time to calm down.  She had diarrhea so her daughter took her to the bathroom to clean her up.  We switched to a nasal cannula on 4L 02 while maintaning 98%.  After about 10 minutes her daughter pulled to cord to alert she needed help. Immediate care was given and patient was assessed with the clinical manager present.   Check BP, 02 SATS (no mental status changed). Heart rate increased but BP and SATS stable.  Helped daughter get patient cleaned and redressed.  Patient requested coffee and sat for a few minutes and then requested to lie down.  It was time for her second set of images but patient had difficulty lying flat and had a hard time breathing.  Dr. Charlton Haws (DOD) was called and he assessed the patient.  He felt her lungs were diminished and had [place] call 911.  The patients family was present the entire time and were completely aware of the situation.  Comfort measures were provided to the patient and the family.  An IV was placed prior to EMS arriving.  EMS transported the patient to the Emergency Department at [facility].

## 2014-07-17 NOTE — Consult Note (Addendum)
PULMONARY  / CRITICAL CARE MEDICINE CONSULTATION  Name: Margaret [name] MRN: [id number] DOB: Sep 27, 1934  PHYSICIAN REQUESTING CONSULT: Dr. Jim Like  REASON FOR CONSULT: respiratory failure    ADMISSION DATE:  07/17/2014  CHIEF COMPLAINT:  Dyspnea   BRIEF PATIENT DESCRIPTION: Margaret [name] with hx of CAD, admitted for flash pulmonary edema after stress test.  SIGNIFICANT EVENTS / STUDIES:  1.  07/17/14 CXR with pulmonary edema, initial ABG 7.19 / 61.8 / 87  LINES / TUBES: 1. PIV  CULTURES: 1. None  ANTIBIOTICS: 1. None  HISTORY OF PRESENT ILLNESS:  This is a 80 year old [name] with history of CAD who was seen as an outpatient for a stress test today.  Her BB and lasix had been held for the procedure.  She developed dyspnea shortly after her lexiscan injection, reportedly also associated with diarrhea x2.  She was found to be tachycardic with exam concerning for pulmonary edema.  She was transported to the ED, where she was found to have significant hypoxia/hypercarbia, and CXR confirmed edema.  She was placed on BIPAP and cardiology was consulted in addition to CCM.  THe patient is unable to give a history, given that she is on BIPAP and does not speak [place].  However, from records it appears she had endorsed some PND and pillow orthopnea prior to presentation.  During my exam, she was sleepy but was able to deny discomfort in her chest and her abdomen.     PAST MEDICAL HISTORY :  Past Medical History  Diagnosis Date  . CAD S/P percutaneous coronary angioplasty 2005    prior LAD PCI - in 2005, LAD stent 100% occluded (mid LAD).  Marland Kitchen Left main coronary artery disease 03/2010    Ostial left main 60, OM1 70, circumflex 50-60, mid LAD stent occluded, mid RCA 30-40, RV marginal 90, EF 45-50% --> CABG - ([facility]) LIMA-LAD, SVG-D1, sSVG-OM1-OM2,  . Chronic diastolic CHF (congestive heart failure), NYHA class 2     Grade 2 Diastolic Dysfunction on Echo (hospitalized in [place] 01/2014 with CHF, no  PTCA performed)  . Hx of echocardiogram 05/2014    EF 50-55%, inf-lat HK, Gr 2 DD, very mild AS (mean 10 mmHg), mild MR, PASP 36 mmHg; calcified density in LA (suggest TEE)  . Atherosclerotic peripheral vascular disease with intermittent claudication 04/2014    LEA Dopplers: RABI 1.02/ LABI: 0.61 (L SFA, Pop & DP blunted wave forms) -- correlates with L leg claudication  . Type 2 diabetes mellitus   . Hyperlipidemia with target LDL less than 70   . Vitamin B12 deficiency   . Thyroid disease     Past Surgical History  Procedure Laterality Date  . Total knee arthroplasty    . Cardiac catheterization  2005    LAD stent occluded  . Cardiac catheterization  03/2010     Ostial left main 60, OM1 70, circumflex 50-60, mid LAD stent occluded, mid RCA 30-40, RV marginal 90, EF 45-50%. >>> CABG  . Coronary artery bypass graft  03/2010    LIMA-LAD, SVG-D1, seqSVG-OM1-OM3   MEDICATIONS: Current facility-administered medications:0.9 %  sodium chloride infusion, 250 mL, Intravenous, PRN, Marykay Lex, MD;  acetaminophen (TYLENOL) tablet 650 mg, 650 mg, Oral, Q4H PRN, Marykay Lex, MD;  aspirin EC tablet 81 mg, 81 mg, Oral, Daily, Marykay Lex, MD, 81 mg at 07/17/14 2051;  [START ON 07/18/2014] atorvastatin (LIPITOR) tablet 20 mg, 20 mg, Oral, q1800, Marykay Lex, MD [START ON 07/18/2014] carvedilol (  COREG) tablet 6.25 mg, 6.25 mg, Oral, BID WC, Marykay Lex, MD;  clopidogrel (PLAVIX) tablet 75 mg, 75 mg, Oral, Daily, Marykay Lex, MD, 75 mg at 07/17/14 2050;  furosemide (LASIX) injection 40 mg, 40 mg, Intravenous, BID, Marykay Lex, MD, 40 mg at 07/17/14 1827;  heparin injection 5,000 Units, 5,000 Units, Subcutaneous, 3 times per day, Marykay Lex, MD, 5,000 Units at 07/17/14 2051 [START ON 07/18/2014] Influenza vac split quadrivalent PF (FLUARIX) injection 0.5 mL, 0.5 mL, Intramuscular, Tomorrow-1000, Corky Crafts, MD;  insulin aspart (novoLOG) injection 0-9 Units, 0-9 Units,  Subcutaneous, TID WC, Marykay Lex, MD, 3 Units at 07/17/14 1656;  [START ON 07/18/2014] levothyroxine (SYNTHROID, LEVOTHROID) tablet 125 mcg, 125 mcg, Oral, QAC breakfast, Marykay Lex, MD ondansetron [facility]) injection 4 mg, 4 mg, Intravenous, Q6H PRN, Marykay Lex, MD;  Melene Muller ON 07/18/2014] pneumococcal 23 valent vaccine (PNU-IMMUNE) injection 0.5 mL, 0.5 mL, Intramuscular, Tomorrow-1000, Corky Crafts, MD;  sodium chloride 0.9 % injection 3 mL, 3 mL, Intravenous, Q12H, Marykay Lex, MD, 3 mL at 07/17/14 2055;  sodium chloride 0.9 % injection 3 mL, 3 mL, Intravenous, PRN, Marykay Lex, MD  ALLERGIES: Allergies  Allergen Reactions  . Penicillins Other (See Comments)    Unknown allergic reaction     FAMILY HISTORY:  Family History  Problem Relation Age of Onset  . Ulcers    . Diabetes    . Arthritis    . Heart attack Neg Hx   . Diabetes    . Diabetes Father     SOCIAL HISTORY:  reports that she has never smoked. She does not have any smokeless tobacco history on file. She reports that she does not drink alcohol or use illicit drugs.  REVIEW OF SYSTEMS: Unable to obtain full 12 point ROS due to patient's condition.   PHYSICAL EXAM VITAL SIGNS: Temp:  [96.8 F (36 C)-98.3 F (36.8 C)] 98.3 F (36.8 C) (09/25 2019) Pulse Rate:  [84-146] 125 (09/25 2039) Resp:  [14-40] 20 (09/25 2039) BP: (92-157)/(32-96) 141/88 mmHg (09/25 1800) SpO2:  [77 %-100 %] 99 % (09/25 2039) FiO2 (%):  [50 %-75 %] 50 % (09/25 2039) Weight:  [139 lb (63.05 kg)-142 lb (64.411 kg)] 141 lb 1.5 oz (64 kg) (09/25 1800)  HEMODYNAMICS:    VENTILATOR SETTINGS: Vent Mode:  [-] BIPAP;PCV FiO2 (%):  [50 %-75 %] 50 % Set Rate:  [14 bmp] 14 bmp Vt Set:  [16 mL] 16 mL PEEP:  [6 cmH20] 6 cmH20 Pressure Support:  [12 cmH20-14 cmH20] 14 cmH20 : General:  Thin, frail appearing [name] in no acute distress, on BIPAP Neuro:  Moves all extremities, localizes to pain, no focal deficits  noted HEENT:  BIPAP mask; dry mucus membranes, trachea midline, PERRL Neck:  Supple, nontender, JVD 7cm Cardiovascular:  Tachycardic, sounds regular, no murmurs Lungs:  Wet inspiratory crackles bilaterally, scattered expiratory wheezes Abdomen:  Soft, nontender, nondistended. +bowel sounds Musculoskeletal:  Decreased muscle bulk; no obvious joint effusions Skin:  Dry, warm, intact; no rashes  INS/OUTS: Intake/Output     09/25 0701 - 09/26 0700   I.V. (mL/kg) 20 (0.3)   Total Intake(mL/kg) 20 (0.3)   Urine (mL/kg/hr) 450   Total Output 450   Net -430         CLINICAL DECISION MAKING  CBC Recent Labs     07/17/14  1253  WBC  17.5*  HGB  11.3*  HCT  35.5*  PLT  291  Coag's No results found for this basename: APTT, INR,  in the last 72 hours  BMET Recent Labs     07/17/14  1253  NA  136*  K  5.1  CL  100  CO2  22  BUN  24*  CREATININE  1.24*  GLUCOSE  315*    Electrolytes Recent Labs     07/17/14  1253  CALCIUM  9.2    Sepsis Markers No results found for this basename: LACTICACIDVEN, PROCALCITON, O2SATVEN,  in the last 72 hours  ABG Recent Labs     07/17/14  1433  07/17/14  1602  07/17/14  1908  PHART  7.189*  7.274*  7.338*  PCO2ART  61.8*  52.3*  44.9  PO2ART  87.0  165.0*  109.0*    Liver Enzymes No results found for this basename: AST, ALT, ALKPHOS, BILITOT, ALBUMIN,  in the last 72 hours  Cardiac Enzymes Recent Labs     07/17/14  1253  07/17/14  1835  TROPONINI  <0.30  <0.30  PROBNP  1966.0*  3446.0*    Glucose Recent Labs     07/17/14  1316  07/17/14  1639  GLUCAP  317*  223*    Imaging Dg Chest Portable 1 View  07/17/2014   CLINICAL DATA:  Shortness of breath following trauma  EXAM: PORTABLE CHEST - 1 VIEW  COMPARISON:  04/19/2010  FINDINGS: Cardiac shadow is prominent. Postsurgical changes are again seen. Diffuse vascular congestion and bilateral fluffy infiltrates are noted consistent with CHF and pulmonary edema. No  definitive bony abnormality is seen.  IMPRESSION: Changes consistent with CHF pulmonary edema.   Electronically Signed   By: Alcide Clever M.D.   On: 07/17/2014 13:05    EKG: tachycardia; sinus rhythm; poor R wave prgression CXR: pulmonary edema as evidenced by increased bilateral interstitial markings, large heart, and alveolar infiltrates.   ASSESSMENT / PLAN: Principal Problem:   Acute on chronic diastolic heart failure Active Problems:   Type 2 diabetes mellitus   Thyroid disorder   Left main coronary artery disease   Chronic diastolic CHF (congestive heart failure), NYHA class 2   Hyperlipidemia with target LDL less than 70   Acute respiratory failure with hypoxia and hypercapnia - secondary to Acute CHF & pulmonary edema   Acute pulmonary edema with congestive heart failure   Pulmonary: A:  Cardiogenic pulmonary edema Hypoxic and Hypercarbic Acute respiratory failure due to above P: Agree with BIPAP initiation ABG and mental status significantly improved on most recent ABG Continue BIPAP prn; OK to d/c if respiratory distress and labs improved Monitor for signs of infection  Aspiration precautions - use sedatives with caution   CRITICAL CARE: The patient is critically ill with multiple organ systems failure and requires high complexity decision making for assessment and support, frequent evaluation and titration of therapies, application of advanced monitoring technologies and extensive interpretation of multiple databases. Critical Care Time devoted to patient care services described in this note is 30 minutes.  I have personally obtained a history, examined the patient, evaluated laboratory and imaging results, formulated the assessment and plan and placed orders.  Pershing Cox, MD Pulmonary and Critical Care Medicine [facility] Pager: [phone]  07/17/2014, 8:49 PM

## 2014-07-17 NOTE — ED Notes (Addendum)
Facility sts pt got lexiscan (1/2 life less then 4 min) sts patient was very anxious upon scan. Pt alert and speaking to family after scan. sts scan did show pleural effusions.   Patient appears more alert at this time, opening eyes to verbal stimulation but unable to speak.   Portable at bedside.

## 2014-07-17 NOTE — ED Notes (Signed)
Per EMS pt was seen across the street for nuclear imaging, alert and oriented upon arrival to office- denies CP, endorsed exertional dyspnea and orthopnea. Post-imaging patient become dyspneic, tachypnic and very anxious. Patient placed on NRB- O2 sats 96%. Lung sound clear throughout. Patient minimally responsive.

## 2014-07-17 NOTE — ED Notes (Signed)
Phlebotomy unable to obtain blood cultures. EDP made aware. EDP wants abx started now.

## 2014-07-17 NOTE — H&P (Signed)
CARDIOLOGY ADMISSION H&p NOTE.  NAME:  Margaret [name]   MRN: [id number] DOB:  [birth year]   ADMIT DATE: 07/17/2014  Reason for Consult: Acute Pulmonary Edema, Acute CHF Exacerbation.  Requesting Physician: Dr. Wilkie Aye  Primary Cardiologist: Everette Rank, MD  HPI: This is a 80 y.o. female with a past medical history significant for CAD (prior LAD stent occlusion, with progression of CAD to LM disease--CABG) as well as chronic DHF 2-2B (Stage D).  She was recently seen by Dr. Kirke Corin for claudication, & he noted exertinoal dyspnea as well.  LEA dopplers & Myoview ST ordered.   (By report, she had a reaction during a ST ~10 yrs ago) She was in the process of her Myoview ST - had held BB & Lasix yesterday & this AM.   On initial presentation to the clinic, she was doing well.  Had Lexiscan injection with expected dyspnea, but the Sx resolved shortly after infusion.  She then informed her Dtr of an acute need for a BM (actually did not make it in time).  While waking to the BR, she became acutely dyspneic U& anxious.  SaO2 74% -> given Java O2 4L & improved to 87% --> FM 15 L --> Sats up to 100%. Had another bout of diarrhea, was back on  O2 for cleaning up in BR -> "After about 10 minutes her daughter pulled to cord to alert she needed help. Immediate care was given and patient was assessed with the clinical manager present. Check BP, 02 SATS (no mental status changed). Heart rate increased but BP and SATS stable. Helped daughter get patient cleaned and redressed. Patient requested coffee and sat for a few minutes and then requested to lie down. It was time for her second set of images but patient had difficulty lying flat and had a hard time breathing. Dr. Charlton Haws (DOD) was called and he assessed the patient. He felt her lungs were diminished and had the staff call 911. " (note from [facility]).  She was brought to the ER & was in acute respiratory distress - tachypneic & minimally  responsive.  Tachycardic with HR in 140s --> initially on NRB, then quickly to BiPAP.  IV Lasix  & IV NTG infusion started. Due to concern for potential hypersensitivity Rxn, she was given 125 mg Solumedrol, Benadryl 25 mg & Pepcid 20 mg.  Due to anxiety - given Ativan 0.5 mg.   Upon my arrival to ER - Dr. Wilkie Aye was in the process of obtaining US guided ABG -- results noted below were quite concerning.  Family asked to return to ER as she may require Intubation.   IV Lopressor 5 mg given - with HR drop into 90s, BP dropped briefly to 90s, but with NTG off - back to 120s mmHg. On BiPap - her respiratory rate improved & she became more comfortable.  She woke up more when family arrived (major language barrier) & Foley cath inserted (~363ml out with insertion).  F/u ABG with notable improvement. With Familty  Cardiac ROS prior to presentation. Cardiovascular ROS: positive for - dyspnea on exertion, paroxysmal nocturnal dyspnea, shortness of breath and claudication, edema negative for - chest pain, irregular heartbeat, loss of consciousness, murmur, palpitations, rapid heart rate or TIA/Amaurosis Fugax, Syncope/Near Syncope  PMHx:  CARDIAC HISTORY: Reviewed below. Past Medical History  Diagnosis Date  . CAD S/P percutaneous coronary angioplasty 2005    prior LAD PCI - in 2005, LAD stent 100% occluded (mid LAD).  Marland Kitchen  Left main coronary artery disease 03/2010    Ostial left main 60, OM1 70, circumflex 50-60, mid LAD stent occluded, mid RCA 30-40, RV marginal 90, EF 45-50% --> CABG - (B[place]le) LIMA-LAD, SVG-D1, sSVG-OM1-OM2,  . Chronic diastolic CHF (congestive heart failure), NYHA class 2     Grade 2 Diastolic Dysfunction on Echo (hospitalized in [place] 01/2014 with CHF, no PTCA performed)  . Hx of echocardiogram 05/2014    EF 50-55%, inf-lat HK, Gr 2 DD, very mild AS (mean 10 mmHg), mild MR, PASP 36 mmHg; calcified density in LA (suggest TEE)  . Atherosclerotic peripheral vascular disease with  intermittent claudication 04/2014    LEA Dopplers: RABI 1.02/ LABI: 0.61 (L SFA, Pop & DP blunted wave forms) -- correlates with L leg claudication  . Type 2 diabetes mellitus   . Hyperlipidemia with target LDL less than 70   . Vitamin B12 deficiency   . Thyroid disease    Past Surgical History  Procedure Laterality Date  . Total knee arthroplasty    . Cardiac catheterization  2005    LAD stent occluded  . Cardiac catheterization  03/2010     Ostial left main 60, OM1 70, circumflex 50-60, mid LAD stent occluded, mid RCA 30-40, RV marginal 90, EF 45-50%. >>> CABG  . Coronary artery bypass graft  03/2010    LIMA-LAD, SVG-D1, seqSVG-OM1-OM3    FAMHx: Family History  Problem Relation Age of Onset  . Ulcers    . Diabetes    . Arthritis    . Heart attack Neg Hx   . Diabetes    . Diabetes Father     SOCHx:  reports that she has never smoked. She does not have any smokeless tobacco history on file. She reports that she does not drink alcohol or use illicit drugs.  ALLERGIES: Allergies  Allergen Reactions  . Penicillins Other (See Comments)    Unknown allergic reaction     ROS:A comprehensive review of systems was performed Review of Systems  Constitutional: Negative for fever, chills and malaise/fatigue.  HENT: Negative for congestion and nosebleeds.   Eyes: Negative for blurred vision and double vision.  Respiratory: Positive for shortness of breath (CXR:  ). Negative for cough, hemoptysis, sputum production, wheezing and stridor.   Cardiovascular: Positive for orthopnea, claudication and leg swelling. Negative for chest pain and palpitations.  Gastrointestinal: Negative for nausea, constipation and blood in stool.  Neurological: Negative for dizziness, sensory change, speech change, focal weakness, seizures and loss of consciousness.  Endo/Heme/Allergies: Negative.   Psychiatric/Behavioral: Negative for depression. The patient is nervous/anxious.        Upon arrival to ER    All other systems reviewed and are negative.  HOME MEDICATIONS: No current facility-administered medications on file prior to encounter.   Current Outpatient Prescriptions on File Prior to Encounter  Medication Sig Dispense Refill  . carvedilol (COREG) 6.25 MG tablet Take 6.25 mg by mouth 2 (two) times daily with a meal. Carca from [place] (after breakfast and after dinner)      . furosemide (LASIX) 40 MG tablet Take 40 mg by mouth daily after breakfast. Lasix from [place]      . rosuvastatin (CRESTOR) 10 MG tablet Take 10 mg by mouth daily after supper. Rosavel from [place]        HOSPITAL MEDICATIONS: I have reviewed the patient's current medications.  VITALS: Blood pressure 141/88, pulse 98, temperature 98.1 F (36.7 C), temperature source Rectal, resp. rate 20, height 5' (1.524 m),  weight 141 lb 1.5 oz (64 kg), SpO2 100.00%.  PHYSICAL EXAM: Physical Exam  Nursing note and vitals reviewed. Constitutional: She appears well-nourished. She appears lethargic. She is easily aroused.  Non-toxic appearance. She does not have a sickly appearance. She does not appear ill. She appears distressed. Face mask in place.  Borderline responsive, but does respond to voice & pain. Level of responsiveness improved during initial care  HENT:  Head: Normocephalic and atraumatic.  Mouth/Throat: Oropharynx is clear and moist. No oropharyngeal exudate.  Eyes: Pupils are equal, round, and reactive to light.  Not able to follow commands, unable to check EOM  Neck: Normal range of motion. Neck supple. JVD present. No tracheal deviation present. No thyromegaly present.  Cardiovascular: Regular rhythm and normal heart sounds.   No extrasystoles are present. Tachycardia present.  PMI is not displaced.  Exam reveals decreased pulses. Exam reveals no gallop, no friction rub, no midsystolic click and no opening snap.   Pulses:      Radial pulses are 1+ on the right side, and 1+ on the left side.       Dorsalis pedis  pulses are 1+ on the right side, and 1+ on the left side.       Posterior tibial pulses are 1+ on the right side, and 1+ on the left side.  Peripheral pulses are thready & weak.  Pulmonary/Chest: Accessory muscle usage present. No stridor. She is in respiratory distress. She has no wheezes. She has rales. She exhibits no tenderness.  On BiPap - resp rate & status improved during course of evaluation  Abdominal: Soft. Bowel sounds are normal. She exhibits no distension and no mass. There is no tenderness. There is no rebound and no guarding.  Genitourinary:  deferred  Musculoskeletal:  Moves all 4 extremities  Neurological: She is easily aroused. She appears lethargic.  Alert & responds to ?s.  Unsure of location, Alert to person & date  Skin: She is diaphoretic.  Psychiatric: Her mood appears anxious.  Lethargic, arousable Unable to assess cognition    LABS: Results for orders placed during the hospital encounter of 07/17/14 (from the past 24 hour(s))  CBC WITH DIFFERENTIAL     Status: Abnormal   Collection Time    07/17/14 12:53 PM      Result Value Ref Range   WBC 17.5 (*) 4.0 - 10.5 K/uL   RBC 3.75 (*) 3.87 - 5.11 MIL/uL   Hemoglobin 11.3 (*) 12.0 - 15.0 g/dL   HCT 04.5 (*) 40.9 - 81.1 %   MCV 94.7  78.0 - 100.0 fL   MCH 30.1  26.0 - 34.0 pg   MCHC 31.8  30.0 - 36.0 g/dL   RDW 91.4  78.2 - 95.6 %   Platelets 291  150 - 400 K/uL   Neutrophils Relative % 59  43 - 77 %   Neutro Abs 10.4 (*) 1.7 - 7.7 K/uL   Lymphocytes Relative 33  12 - 46 %   Lymphs Abs 5.8 (*) 0.7 - 4.0 K/uL   Monocytes Relative 4  3 - 12 %   Monocytes Absolute 0.8  0.1 - 1.0 K/uL   Eosinophils Relative 3  0 - 5 %   Eosinophils Absolute 0.4  0.0 - 0.7 K/uL   Basophils Relative 1  0 - 1 %   Basophils Absolute 0.1  0.0 - 0.1 K/uL  BASIC METABOLIC PANEL     Status: Abnormal   Collection Time    07/17/14 12:53  PM      Result Value Ref Range   Sodium 136 (*) 137 - 147 mEq/L   Potassium 5.1  3.7 - 5.3  mEq/L   Chloride 100  96 - 112 mEq/L   CO2 22  19 - 32 mEq/L   Glucose, Bld 315 (*) 70 - 99 mg/dL   BUN 24 (*) 6 - 23 mg/dL   Creatinine, Ser 1.61 (*) 0.50 - 1.10 mg/dL   Calcium 9.2  8.4 - 09.6 mg/dL   GFR calc non Af Amer 41 (*) >90 mL/min   GFR calc Af Amer 47 (*) >90 mL/min   Anion gap 14  5 - 15  PRO B NATRIURETIC PEPTIDE     Status: Abnormal   Collection Time    07/17/14 12:53 PM      Result Value Ref Range   Pro B Natriuretic peptide (BNP) 1966.0 (*) 0 - 450 pg/mL  TROPONIN I     Status: None   Collection Time    07/17/14 12:53 PM      Result Value Ref Range   Troponin I <0.30  <0.30 ng/mL  TSH     Status: None   Collection Time    07/17/14 12:53 PM      Result Value Ref Range   TSH 2.730  0.350 - 4.500 uIU/mL  I-STAT CG4 LACTIC ACID, ED     Status: None   Collection Time    07/17/14  1:10 PM      Result Value Ref Range   Lactic Acid, Venous 1.84  0.5 - 2.2 mmol/L  CBG MONITORING, ED     Status: Abnormal   Collection Time    07/17/14  1:16 PM      Result Value Ref Range   Glucose-Capillary 317 (*) 70 - 99 mg/dL  I-STAT ARTERIAL BLOOD GAS, ED     Status: Abnormal   Collection Time    07/17/14  2:33 PM      Result Value Ref Range   pH, Arterial 7.189 (*) 7.350 - 7.450   pCO2 arterial 61.8 (*) 35.0 - 45.0 mmHg   pO2, Arterial 87.0  80.0 - 100.0 mmHg   Bicarbonate 23.5  20.0 - 24.0 mEq/L   TCO2 25  0 - 100 mmol/L   O2 Saturation 94.0     Acid-base deficit 5.0 (*) 0.0 - 2.0 mmol/L   Collection site RADIAL, ALLEN'S TEST ACCEPTABLE     Drawn by RT     Sample type ARTERIAL     Comment NOTIFIED PHYSICIAN    URINALYSIS, ROUTINE W REFLEX MICROSCOPIC     Status: Abnormal   Collection Time    07/17/14  3:07 PM      Result Value Ref Range   Color, Urine YELLOW  YELLOW   APPearance CLEAR  CLEAR   Specific Gravity, Urine 1.009  1.005 - 1.030   pH 6.0  5.0 - 8.0   Glucose, UA 100 (*) NEGATIVE mg/dL   Hgb urine dipstick NEGATIVE  NEGATIVE   Bilirubin Urine NEGATIVE   NEGATIVE   Ketones, ur NEGATIVE  NEGATIVE mg/dL   Protein, ur NEGATIVE  NEGATIVE mg/dL   Urobilinogen, UA 0.2  0.0 - 1.0 mg/dL   Nitrite NEGATIVE  NEGATIVE   Leukocytes, UA NEGATIVE  NEGATIVE  I-STAT ARTERIAL BLOOD GAS, ED     Status: Abnormal   Collection Time    07/17/14  4:02 PM      Result Value Ref Range   pH,  Arterial 7.274 (*) 7.350 - 7.450   pCO2 arterial 52.3 (*) 35.0 - 45.0 mmHg   pO2, Arterial 165.0 (*) 80.0 - 100.0 mmHg   Bicarbonate 24.3 (*) 20.0 - 24.0 mEq/L   TCO2 26  0 - 100 mmol/L   O2 Saturation 99.0     Acid-base deficit 3.0 (*) 0.0 - 2.0 mmol/L   Collection site RADIAL, ALLEN'S TEST ACCEPTABLE     Drawn by RT     Sample type ARTERIAL    CBG MONITORING, ED     Status: Abnormal   Collection Time    07/17/14  4:39 PM      Result Value Ref Range   Glucose-Capillary 223 (*) 70 - 99 mg/dL    IMAGING: Dg Chest Portable 1 View  07/17/2014   CLINICAL DATA:  Shortness of breath following trauma  EXAM: PORTABLE CHEST - 1 VIEW  COMPARISON:  04/19/2010  FINDINGS: Cardiac shadow is prominent. Postsurgical changes are again seen. Diffuse vascular congestion and bilateral fluffy infiltrates are noted consistent with CHF and pulmonary edema. No definitive bony abnormality is seen.  IMPRESSION: Changes consistent with CHF pulmonary edema.   Electronically Signed   By: Alcide Clever M.D.   On: 07/17/2014 13:05   IMPRESSION: Principal Problem:   Acute on chronic diastolic heart failure Active Problems:   Left main coronary artery disease   Chronic diastolic CHF (congestive heart failure), NYHA class 2   Acute respiratory failure with hypoxia and hypercapnia - secondary to Acute CHF & pulmonary edema   Acute pulmonary edema with congestive heart failure   Type 2 diabetes mellitus   Hyperlipidemia with target LDL less than 70   Thyroid disorder    What remains unclear is the cause of her acute exacerbation -- was treated with steroids etc for potential hypersensitivity  Rxn Lethargy is related to hypercapnia & sedatives -- hold sedatives Was in the process of Myoview ST -- reportedly had complication in 2005 (family cannot clarify)  RECOMMENDATION:  Admit to CCU - continue BiPap on current settings (FiO2 reduced following 2nd ABG)  PCCM consulted to assist with management of BiPap  Keep NTG gtt on standby if BP begins to increase  IV Lasix 40 mg BID -strict I/Os.  Is ~NPO on BiPap - will order home meds (BB, ASA & Statin), but probably cannot start until AM  SSI with no basal insulin - holding home hypoglycemics & metformin.  Cannot exclude coronary ischemia = cycle cardiac biomarkers; with plan for LE Arterial evaluation for L Leg claudication, we could consider LHC-Angio +/- PCI for more definitive ischemic evaluation prior to PV procedure.  If no change to CAD & no PCI indicated, could doe LE Angio  Continue synthroid - check TSH  Has just had an Echo last month - no need to repeat (EF was preserved)   The patient is critically ill with multisystem dysfunction requiring extensive critical decision making & management. In ER - I personally assisted EDP with BiPap management, US guided ABG, medications.  Decision was made to hold off on intubation. Critical Care time ~48min.  Time Spent Directly with Patient: 90 minutes  Brandy Zuba W, M.D., M.S. Interventional Cardiologist   Pager # [phone]

## 2014-07-17 NOTE — ED Notes (Signed)
Pts HR now 98 bpm. Repeat EKG obtained and shown to Cardiologist who is at bedside.

## 2014-07-17 NOTE — ED Provider Notes (Addendum)
CSN: [id number]     Arrival date & time 07/17/14  1245 History   First MD Initiated Contact with Patient 07/17/14 1302     Chief Complaint  Patient presents with  . Shortness of Breath     (Consider location/radiation/quality/duration/timing/severity/associated sxs/prior Treatment) HPI  This is a 80 year old female with history of coronary artery disease status post CABG, diabetes, hypercholesterolemia who presents in respiratory distress. Per EMS, patient was having a nuclear medicine stress test across the street when she became abruptly short of breath.  They were unable to obtain good oxygen saturations. She stated be tachycardic in the 140s, dyspneic, and anxious.  She was placed on a nonrebreather.    Spoke with the tech in radiology. Patient reportedly tolerated administration of Lexiscan and tolerated stress test without incident. She was sitting with her family when she had onset of shortness of breath. Per patient's daughter, she did not take her Lasix this morning.  Level V caveat for acuity of condition   Past Medical History  Diagnosis Date  . Type 2 diabetes mellitus   . CAD (coronary artery disease)   . Hypercholesteremia   . Thyroid disorder   . Vitamin B12 deficiency   . Hx of echocardiogram     Echo (8/15):  EF 50-55%, inf-lat HK, Gr 2 DD, very mild AS (mean 10 mmHg), mild MR, PASP 36 mmHg; calcified density in LA (suggest TEE)   Past Surgical History  Procedure Laterality Date  . Total knee arthroplasty    . Cardiac catheterization      with stent placement  . Cataract extraction     Family History  Problem Relation Age of Onset  . Ulcers    . Diabetes    . Arthritis    . Heart attack Neg Hx   . Diabetes    . Diabetes Father    History  Substance Use Topics  . Smoking status: Never Smoker   . Smokeless tobacco: Not on file  . Alcohol Use: No   OB History   Grav Para Term Preterm Abortions TAB SAB Ect Mult Living                 Review of  Systems  Unable to perform ROS: Acuity of condition      Allergies  Penicillins  Home Medications   Prior to Admission medications   Medication Sig Start Date End Date Taking? Authorizing Provider  carvedilol (COREG) 6.25 MG tablet Take 6.25 mg by mouth 2 (two) times daily with a meal.    Historical Provider, MD  cilostazol (PLETAL) 100 MG tablet Take 100 mg by mouth 2 (two) times daily. Take one tablet by mouth 30 mins before or 2 hours after breakfast and dinner twice daily.    Historical Provider, MD  furosemide (LASIX) 40 MG tablet Take 40 mg by mouth daily.     Historical Provider, MD  NON FORMULARY Take 100 mg by mouth daily. URIBID    Historical Provider, MD  NON FORMULARY Take 30 mg by mouth daily. RECLIDE-MR    Historical Provider, MD  NON FORMULARY Take 125 mcg by mouth daily. THYROXINE    Historical Provider, MD  rosuvastatin (CRESTOR) 10 MG tablet Take 10 mg by mouth daily.    Historical Provider, MD  sitaGLIPtin (JANUVIA) 100 MG tablet Take 100 mg by mouth daily.    Historical Provider, MD   BP 155/61  Pulse 146  Resp 40  Ht 4\' 10"  (1.473 m)  Wt  139 lb (63.05 kg)  BMI 29.06 kg/m2  SpO2 98% Physical Exam  Nursing note and vitals reviewed. Constitutional:  Tachypnea, increased work of breathing  HENT:  Head: Normocephalic and atraumatic.  Eyes: Pupils are equal, round, and reactive to light.  Neck: Neck supple.  Cardiovascular: Regular rhythm and normal heart sounds.   No murmur heard. Tachycardia  Pulmonary/Chest: Effort normal. No respiratory distress. She has wheezes.  Course crackles and wheezing bilaterally, Sternal scar well healed  Abdominal: Soft. Bowel sounds are normal. There is no tenderness. There is no rebound.  Musculoskeletal: She exhibits edema.  1+ bilateral lower extremity edema  Neurological: She is alert.  Level of responsiveness waxes and wanes  Skin: Skin is warm and dry.  Psychiatric: She has a normal mood and affect.    ED Course   Procedures (including critical care time)  CRITICAL CARE Performed by: Ross Marcus, F   Total critical care time: 60 min  Critical care time was exclusive of separately billable procedures and treating other patients.  Critical care was necessary to treat or prevent imminent or life-threatening deterioration.  Critical care was time spent personally by me on the following activities: development of treatment plan with patient and/or surrogate as well as nursing, discussions with consultants, evaluation of patient's response to treatment, examination of patient, obtaining history from patient or surrogate, ordering and performing treatments and interventions, ordering and review of laboratory studies, ordering and review of radiographic studies, pulse oximetry and re-evaluation of patient's condition.  Labs Review Labs Reviewed  CBC WITH DIFFERENTIAL - Abnormal; Notable for the following:    WBC 17.5 (*)    RBC 3.75 (*)    Hemoglobin 11.3 (*)    HCT 35.5 (*)    Neutro Abs 10.4 (*)    Lymphs Abs 5.8 (*)    All other components within normal limits  BASIC METABOLIC PANEL - Abnormal; Notable for the following:    Sodium 136 (*)    Glucose, Bld 315 (*)    BUN 24 (*)    Creatinine, Ser 1.24 (*)    GFR calc non Af Amer 41 (*)    GFR calc Af Amer 47 (*)    All other components within normal limits  PRO B NATRIURETIC PEPTIDE - Abnormal; Notable for the following:    Pro B Natriuretic peptide (BNP) 1966.0 (*)    All other components within normal limits  CBG MONITORING, ED - Abnormal; Notable for the following:    Glucose-Capillary 317 (*)    All other components within normal limits  TROPONIN I  I-STAT CG4 LACTIC ACID, ED  I-STAT ARTERIAL BLOOD GAS, ED    Imaging Review Dg Chest Portable 1 View  07/17/2014   CLINICAL DATA:  Shortness of breath following trauma  EXAM: PORTABLE CHEST - 1 VIEW  COMPARISON:  04/19/2010  FINDINGS: Cardiac shadow is prominent. Postsurgical  changes are again seen. Diffuse vascular congestion and bilateral fluffy infiltrates are noted consistent with CHF and pulmonary edema. No definitive bony abnormality is seen.  IMPRESSION: Changes consistent with CHF pulmonary edema.   Electronically Signed   By: Alcide Clever M.D.   On: 07/17/2014 13:05     EKG Interpretation   Date/Time:  Friday July 17 2014 12:46:12 EDT Ventricular Rate:  137 PR Interval:  125 QRS Duration: 96 QT Interval:  297 QTC Calculation: 448 R Axis:   -27 Text Interpretation:  Sinus tachycardia Anterior infarct, old Nonspecific  T abnormalities, lateral leads Q waves anteriorly  new (V2/V3) Confirmed by  HORTON  MD, COURTNEY ([id number]) on 07/17/2014 1:54:09 PM      MDM   Final diagnoses:  Acute decompensated heart failure  Respiratory distress    Patient presents in acute respiratory distress. Just finished a nuclear medicine stress test. Initially difficult to examine secondary to light with barrier and acuity of condition. Oxygen saturations off the nonrebreather 78%. She is wheezing and coarse bilaterally. Unclear this time whether this is acute decompensated heart failure versus pneumonia versus anaphylactic reaction to one of the agents from a nuclear medicine stress test. It appears she tolerated the stress test well as well as the administered agent. She was empirically given Solu-Medrol, Benadryl, and Pepcid. She was placed on BiPAP for respiratory support with almost immediate improvement of her mental status  Chest x-ray shows pulmonary edema consistent with CHF. She does have lower extremity edema. persistently tachycardic and anxious appearing. Patient was given half a milligram of Ativan to tolerate BiPAP. Labwork notable for an elevated BNP. Clinical picture my suspicions for acute decompensated heart failure. Last EF 50-55% with grade 2 diastolic dysfunction. She did not take her Lasix today. She was given 40 mg of IV Lasix and placed on a  nitroglycerin drip.  Cardiology has been consulted to evaluate the patient for admission.    Shon Baton, MD 07/17/14 1358  On recheck, patient with worsening mental status. Cardiology at the bedside. ABG obtained and shows hypercarbia with a CO2 of 61. Family in route to the hospital. At this time they would like to hold off on intubation. Given tachycardia, patient was given beta blockade at the bedside with improvement of heart rate to the 90s.    No evidence of sepsis; however, given leukocytosis and concern for aspiration with mental status, will give empiric antibiotics and obtain blood cultures.  Shon Baton, MD 07/17/14 [phone]

## 2014-07-17 NOTE — ED Notes (Signed)
Cardiologist at bedside.  

## 2014-07-18 ENCOUNTER — Inpatient Hospital Stay (HOSPITAL_COMMUNITY): Payer: Managed Care, Other (non HMO)

## 2014-07-18 DIAGNOSIS — J9600 Acute respiratory failure, unspecified whether with hypoxia or hypercapnia: Secondary | ICD-10-CM

## 2014-07-18 LAB — CBC WITH DIFFERENTIAL/PLATELET
Basophils Absolute: 0 10*3/uL (ref 0.0–0.1)
Basophils Relative: 0 % (ref 0–1)
Eosinophils Absolute: 0 10*3/uL (ref 0.0–0.7)
Eosinophils Relative: 0 % (ref 0–5)
HEMATOCRIT: 31.1 % — AB (ref 36.0–46.0)
Hemoglobin: 10.1 g/dL — ABNORMAL LOW (ref 12.0–15.0)
LYMPHS PCT: 9 % — AB (ref 12–46)
Lymphs Abs: 0.8 10*3/uL (ref 0.7–4.0)
MCH: 29.9 pg (ref 26.0–34.0)
MCHC: 32.5 g/dL (ref 30.0–36.0)
MCV: 92 fL (ref 78.0–100.0)
MONO ABS: 0.2 10*3/uL (ref 0.1–1.0)
Monocytes Relative: 2 % — ABNORMAL LOW (ref 3–12)
NEUTROS ABS: 8.2 10*3/uL — AB (ref 1.7–7.7)
Neutrophils Relative %: 89 % — ABNORMAL HIGH (ref 43–77)
PLATELETS: 172 10*3/uL (ref 150–400)
RBC: 3.38 MIL/uL — ABNORMAL LOW (ref 3.87–5.11)
RDW: 12.6 % (ref 11.5–15.5)
WBC: 9.2 10*3/uL (ref 4.0–10.5)

## 2014-07-18 LAB — GLUCOSE, CAPILLARY
GLUCOSE-CAPILLARY: 140 mg/dL — AB (ref 70–99)
GLUCOSE-CAPILLARY: 144 mg/dL — AB (ref 70–99)
Glucose-Capillary: 179 mg/dL — ABNORMAL HIGH (ref 70–99)
Glucose-Capillary: 183 mg/dL — ABNORMAL HIGH (ref 70–99)

## 2014-07-18 LAB — COMPREHENSIVE METABOLIC PANEL
ALT: 9 U/L (ref 0–35)
AST: 17 U/L (ref 0–37)
Albumin: 3.4 g/dL — ABNORMAL LOW (ref 3.5–5.2)
Alkaline Phosphatase: 54 U/L (ref 39–117)
Anion gap: 15 (ref 5–15)
BILIRUBIN TOTAL: 0.4 mg/dL (ref 0.3–1.2)
BUN: 28 mg/dL — ABNORMAL HIGH (ref 6–23)
CHLORIDE: 100 meq/L (ref 96–112)
CO2: 23 meq/L (ref 19–32)
Calcium: 9.1 mg/dL (ref 8.4–10.5)
Creatinine, Ser: 1.21 mg/dL — ABNORMAL HIGH (ref 0.50–1.10)
GFR calc Af Amer: 48 mL/min — ABNORMAL LOW (ref >90)
GFR calc non Af Amer: 42 mL/min — ABNORMAL LOW (ref >90)
Glucose, Bld: 157 mg/dL — ABNORMAL HIGH (ref 70–99)
POTASSIUM: 4.2 meq/L (ref 3.7–5.3)
Sodium: 138 mEq/L (ref 137–147)
Total Protein: 7.3 g/dL (ref 6.0–8.3)

## 2014-07-18 LAB — URINE CULTURE
CULTURE: NO GROWTH
Colony Count: NO GROWTH

## 2014-07-18 LAB — TROPONIN I
Troponin I: <0.3 ng/mL (ref <0.30)
Troponin I: <0.3 ng/mL (ref <0.30)

## 2014-07-18 MED ORDER — CETYLPYRIDINIUM CHLORIDE 0.05 % MT LIQD
7.0000 mL | Freq: Two times a day (BID) | OROMUCOSAL | Status: DC
  Administered 2014-07-18 – 2014-07-23 (×10): 7 mL via OROMUCOSAL

## 2014-07-18 NOTE — Progress Notes (Signed)
Patient Name: Margaret [name]      SUBJECTIVE:admitted 9/25 with CHF exacerbation in context of CAD and/ CABG.. Had been referred for Myoview which was complicated by dyspnea which resolved and then acutely worsened  [facility] >> acute respiratory failure with PO2 87/62/7.19  ? How much oxygen Rx with bipap  Echo 8/15 normal LV function;  myoview images not available  CXR this am is much improved  She feels much better on nasal O2   Past Medical History  Diagnosis Date  . CAD S/P percutaneous coronary angioplasty 2005    prior LAD PCI - in 2005, LAD stent 100% occluded (mid LAD).  Marland Kitchen Left main coronary artery disease 03/2010    Ostial left main 60, OM1 70, circumflex 50-60, mid LAD stent occluded, mid RCA 30-40, RV marginal 90, EF 45-50% --> CABG - (B[facility]) LIMA-LAD, SVG-D1, sSVG-OM1-OM2,  . Chronic diastolic CHF (congestive heart failure), NYHA class 2     Grade 2 Diastolic Dysfunction on Echo (hospitalized in [place] 01/2014 with CHF, no PTCA performed)  . Hx of echocardiogram 05/2014    EF 50-55%, inf-lat HK, Gr 2 DD, very mild AS (mean 10 mmHg), mild MR, PASP 36 mmHg; calcified density in LA (suggest TEE)  . Atherosclerotic peripheral vascular disease with intermittent claudication 04/2014    LEA Dopplers: RABI 1.02/ LABI: 0.61 (L SFA, Pop & DP blunted wave forms) -- correlates with L leg claudication  . Type 2 diabetes mellitus   . Hyperlipidemia with target LDL less than 70   . Vitamin B12 deficiency   . Thyroid disease     Scheduled Meds:  Scheduled Meds: . aspirin EC  81 mg Oral Daily  . atorvastatin  20 mg Oral q1800  . carvedilol  6.25 mg Oral BID WC  . clopidogrel  75 mg Oral Daily  . furosemide  40 mg Intravenous BID  . heparin  5,000 Units Subcutaneous 3 times per day  . Influenza vac split quadrivalent PF  0.5 mL Intramuscular Tomorrow-1000  . insulin aspart  0-9 Units Subcutaneous TID WC  . levothyroxine  125 mcg Oral  QAC breakfast  . pneumococcal 23 valent vaccine  0.5 mL Intramuscular Tomorrow-1000  . sodium chloride  3 mL Intravenous Q12H   Continuous Infusions:  sodium chloride, acetaminophen, ondansetron (ZOFRAN) IV, sodium chloride    PHYSICAL EXAM Filed Vitals:   07/18/14 0703 07/18/14 0722 07/18/14 0730 07/18/14 0800  BP: 114/47 114/47  102/34  Pulse: 84 87  87  Temp:   97.5 F (36.4 C)   TempSrc:   Oral   Resp: Height:      Weight:      SpO2: 100% 100%  100%    Well developed and nourished in no acute distress HENT normal Neck supple with JVP-flat Rales, ronchi Regular rate and rhythm, no murmurs or gallops Abd-soft with active BS No Clubbing cyanosis rt edema Skin-warm and dry A & Oriented  Grossly normal sensory and motor function   TELEMETRY: Reviewed telemetry pt  sinus    Intake/Output Summary (Last 24 hours) at 07/18/14 0927 Last data filed at 07/18/14 0800  Gross per 24 hour  Intake    143 ml  Output   1825 ml  Net  -1682 ml    LABS: Basic Metabolic Panel:  Recent Labs Lab 07/17/14 1253 07/18/14 0109  NA 136* 138  K 5.1 4.2  CL  100 100  CO2 22 23  GLUCOSE 315* 157*  BUN 24* 28*  CREATININE 1.24* 1.21*  CALCIUM 9.2 9.1   Cardiac Enzymes:  Recent Labs  07/17/14 1835 07/18/14 0109 07/18/14 0620  TROPONINI <0.30 <0.30 <0.30   CBC:  Recent Labs Lab 07/17/14 1253 07/18/14 0109  WBC 17.5* 9.2  NEUTROABS 10.4* 8.2*  HGB 11.3* 10.1*  HCT 35.5* 31.1*  MCV 94.7 92.0  PLT 291 172   PROTIME: No results found for this basename: LABPROT, INR,  in the last 72 hours Liver Function Tests:  Recent Labs  07/18/14 0109  AST 17  ALT 9  ALKPHOS 54  BILITOT 0.4  PROT 7.3  ALBUMIN 3.4*   No results found for this basename: LIPASE, AMYLASE,  in the last 72 hours BNP: BNP (last 3 results)  Recent Labs  05/29/14 1137 07/17/14 1253 07/17/14 1835  PROBNP 393.0* 1966.0* 3446.0*   D-Dimer: No results found for this basename:  DDIMER,  in the last 72 hours Hemoglobin A1C: No results found for this basename: HGBA1C,  in the last 72 hours Fasting Lipid Panel: No results found for this basename: CHOL, HDL, LDLCALC, TRIG, CHOLHDL, LDLDIRECT,  in the last 72 hours Thyroid Function Tests:  Recent Labs  07/17/14 1253  TSH 2.730   Anemia Panel: No results found for this basename: VITAMINB12, FOLATE, FERRITIN, TIBC, IRON, RETICCTPCT,  in the last 72 hours    ASSESSMENT AND PLAN:  Principal Problem:   Acute on chronic diastolic heart failure Active Problems:   Type 2 diabetes mellitus   Thyroid disorder   Left main coronary artery disease   Chronic diastolic CHF (congestive heart failure), NYHA class 2   Hyperlipidemia with target LDL less than 70   Acute respiratory failure with hypoxia and hypercapnia - secondary to Acute CHF & pulmonary edema   Acute pulmonary edema with congestive heart failure  Much improved Will reduce lasix to po Transfer to step down See if we can get myoview report Maybe home in am  Signed, Sherryl Manges MD  07/18/2014

## 2014-07-18 NOTE — Progress Notes (Signed)
Critical Care physician made aware of pt's low BP. No new orders received. Will continue to monitor closely. Pt. Awake and alert, states she feels fine and comfortable in bed.

## 2014-07-18 NOTE — Progress Notes (Signed)
Pt awake, no distress noted.  BBSH diminished, no crackles presently noted.  Pt placed on 3 lpm [place], sat 100%, no distress noted.  Pt appears to be tol [place] well presently.  RN aware.

## 2014-07-18 NOTE — Progress Notes (Signed)
PULMONARY  / CRITICAL CARE MEDICINE CONSULTATION  Name: Margaret [name] MRN: [id number] DOB: December 02, 1933  PHYSICIAN REQUESTING CONSULT: Dr. Jim Like  REASON FOR CONSULT: respiratory failure    ADMISSION DATE:  07/17/2014  CHIEF COMPLAINT:  Dyspnea   BRIEF PATIENT DESCRIPTION:    This is a 80 year old woman with history of CAD who was seen as an outpatient for a stress test today.  Her BB and lasix had been held for the procedure.  She developed dyspnea shortly after her lexiscan injection, reportedly also associated with diarrhea x2.  She was found to be tachycardic with exam concerning for pulmonary edema.  She was transported to the ED, where she was found to have significant hypoxia/hypercarbia, and CXR confirmed edema.  She was placed on BIPAP and cardiology was consulted in addition to CCM.  THe patient is unable to give a history, given that she is on BIPAP and does not speak [place].  However, from records it appears she had endorsed some PND and pillow orthopnea prior to presentation.  During my exam, she was sleepy but was able to deny discomfort in her chest and her abdomen.     has a past medical history of CAD S/P percutaneous coronary angioplasty (2005); Left main coronary artery disease (03/2010); Chronic diastolic CHF (congestive heart failure), NYHA class 2; echocardiogram (05/2014); Atherosclerotic peripheral vascular disease with intermittent claudication (04/2014); Type 2 diabetes mellitus; Hyperlipidemia with target LDL less than 70; Vitamin Y65 deficiency; and Thyroid disease.   has past surgical history that includes Total knee arthroplasty; Cardiac catheterization (2005); Cardiac catheterization (03/2010); and Coronary artery bypass graft (03/2010).   SIGNIFICANT EVENTS / STUDIES:  1.  07/17/14 CXR with pulmonary edema, initial ABG 7.19 / 61.8 / 87  LINES / TUBES: 1. PIV  CULTURES: 1. None  ANTIBIOTICS: 1. None   SUBJECTIVE/OVERNIGHT/INTERVAL HX 07/18/14: Improved. Off bipap  this morning but still with congested cough  PHYSICAL EXAM VITAL SIGNS: Temp:  [96.8 F (36 C)-98.3 F (36.8 C)] 97.5 F (36.4 C) (09/26 0730) Pulse Rate:  [78-146] 89 (09/26 0900) Resp:  [11-40] 13 (09/26 0900) BP: (85-157)/(32-100) 99/40 mmHg (09/26 0900) SpO2:  [77 %-100 %] 100 % (09/26 0900) FiO2 (%):  [50 %-75 %] 50 % (09/26 0410) Weight:  [63.05 kg (139 lb)-64 kg (141 lb 1.5 oz)] 63.5 kg (139 lb 15.9 oz) (09/26 0400)  HEMODYNAMICS:    VENTILATOR SETTINGS: Vent Mode:  [-] BIPAP FiO2 (%):  [50 %-75 %] 50 % Set Rate:  [14 bmp] 14 bmp Vt Set:  [16 mL] 16 mL PEEP:  [6 cmH20] 6 cmH20 Pressure Support:  [12 cmH20-14 cmH20] 14 cmH20 : General:  Thin, frail appearing woman in no acute distress, on BIPAP Neuro:  Moves all extremities, localizes to pain, no focal deficits noted HEENT: nasal cannula opn Neck:  Supple, nontender, JVD 7cm Cardiovascular:  Tachycardic, sounds regular, no murmurs Lungs:  Wet inspiratory crackles bilaterally still present, scattered expiratory wheezes are now resoled Abdomen:  Soft, nontender, nondistended. +bowel sounds Musculoskeletal:  Decreased muscle bulk; no obvious joint effusions Skin:  Dry, warm, intact; no rashes  INS/OUTS: Intake/Output     09/25 0701 - 09/26 0700 09/26 0701 - 09/27 0700   P.O.  120   I.V. (mL/kg) 23 (0.4)    Total Intake(mL/kg) 23 (0.4) 120 (1.9)   Urine (mL/kg/hr) 1825    Total Output 1825     Net -1802 +120        Urine Occurrence 1 x  PULMONARY  Recent Labs Lab 07/17/14 1433 07/17/14 1602 07/17/14 1908  PHART 7.189* 7.274* 7.338*  PCO2ART 61.8* 52.3* 44.9  PO2ART 87.0 165.0* 109.0*  HCO3 23.5 24.3* 24.2*  TCO2 O2SAT 94.0 99.0 98.0    CBC  Recent Labs Lab 07/17/14 1253 07/18/14 0109  HGB 11.3* 10.1*  HCT 35.5* 31.1*  WBC 17.5* 9.2  PLT 291 172    COAGULATION No results found for this basename: INR,  in the last 168 hours  CARDIAC   Recent Labs Lab 07/17/14 1253  07/17/14 1835 07/18/14 0109 07/18/14 0620  TROPONINI <0.30 <0.30 <0.30 <0.30    Recent Labs Lab 07/17/14 1253 07/17/14 1835  PROBNP 1966.0* 3446.0*     CHEMISTRY  Recent Labs Lab 07/17/14 1253 07/18/14 0109  NA 136* 138  K 5.1 4.2  CL 100 100  CO2 22 23  GLUCOSE 315* 157*  BUN 24* 28*  CREATININE 1.24* 1.21*  CALCIUM 9.2 9.1   Estimated Creatinine Clearance: 31.9 ml/min (by C-G formula based on Cr of 1.21).   LIVER  Recent Labs Lab 07/18/14 0109  AST 17  ALT 9  ALKPHOS 54  BILITOT 0.4  PROT 7.3  ALBUMIN 3.4*     INFECTIOUS  Recent Labs Lab 07/17/14 1310  LATICACIDVEN 1.84     ENDOCRINE CBG (last 3)   Recent Labs  07/17/14 1639 07/17/14 2221 07/18/14 0740  GLUCAP 223* 169* 140*         IMAGING x48h Dg Chest Portable 1 View  07/17/2014   CLINICAL DATA:  Shortness of breath following trauma  EXAM: PORTABLE CHEST - 1 VIEW  COMPARISON:  04/19/2010  FINDINGS: Cardiac shadow is prominent. Postsurgical changes are again seen. Diffuse vascular congestion and bilateral fluffy infiltrates are noted consistent with CHF and pulmonary edema. No definitive bony abnormality is seen.  IMPRESSION: Changes consistent with CHF pulmonary edema.   Electronically Signed   By: Alcide Clever M.D.   On: 07/17/2014 13:05      ASSESSMENT / PLAN: Principal Problem:   Acute on chronic diastolic heart failure Active Problems:   Type 2 diabetes mellitus   Thyroid disorder   Left main coronary artery disease   Chronic diastolic CHF (congestive heart failure), NYHA class 2   Hyperlipidemia with target LDL less than 70   Acute respiratory failure with hypoxia and hypercapnia - secondary to Acute CHF & pulmonary edema   Acute pulmonary edema with congestive heart failure   Pulmonary: A:  Cardiogenic pulmonary edema due to acute on chronic diastolic CHF. No MI Hypoxic and Hypercarbic Acute respiratory failure due to above   - improved clinically and  radiologically but still with crackles  - off bipap this AM    P: [facility] [place] for pulse ox > 88% Still do bipap qhs Diureses per cards Monitor for signs of infection  Aspiration precautions - use sedatives with caution     FAMILY 07/18/14: updated daughter at bedside and husband     Dr. Kalman Shan, M.D., [facility].C.P Pulmonary and Critical Care Medicine Staff Physician [facility] [place] Pulmonary and Critical Care Pager: [phone], If no answer or between  15:00h - 7:00h: call [phone]  07/18/2014 11:00 AM

## 2014-07-18 NOTE — Progress Notes (Signed)
Pt shook her head no to being placed on BiPAP at this time. RT will check on pt at next rounds.

## 2014-07-19 ENCOUNTER — Encounter (HOSPITAL_COMMUNITY): Payer: Self-pay | Admitting: Internal Medicine

## 2014-07-19 DIAGNOSIS — N289 Disorder of kidney and ureter, unspecified: Secondary | ICD-10-CM

## 2014-07-19 DIAGNOSIS — I5033 Acute on chronic diastolic (congestive) heart failure: Secondary | ICD-10-CM

## 2014-07-19 HISTORY — DX: Disorder of kidney and ureter, unspecified: N28.9

## 2014-07-19 LAB — GLUCOSE, CAPILLARY
GLUCOSE-CAPILLARY: 189 mg/dL — AB (ref 70–99)
GLUCOSE-CAPILLARY: 309 mg/dL — AB (ref 70–99)
Glucose-Capillary: 155 mg/dL — ABNORMAL HIGH (ref 70–99)
Glucose-Capillary: 203 mg/dL — ABNORMAL HIGH (ref 70–99)

## 2014-07-19 LAB — BASIC METABOLIC PANEL
Anion gap: 17 — ABNORMAL HIGH (ref 5–15)
BUN: 35 mg/dL — AB (ref 6–23)
CO2: 26 mEq/L (ref 19–32)
CREATININE: 1.43 mg/dL — AB (ref 0.50–1.10)
Calcium: 9.2 mg/dL (ref 8.4–10.5)
Chloride: 97 mEq/L (ref 96–112)
GFR calc Af Amer: 40 mL/min — ABNORMAL LOW (ref >90)
GFR, EST NON AFRICAN AMERICAN: 34 mL/min — AB (ref >90)
Glucose, Bld: 132 mg/dL — ABNORMAL HIGH (ref 70–99)
Potassium: 4.3 mEq/L (ref 3.7–5.3)
Sodium: 140 mEq/L (ref 137–147)

## 2014-07-19 MED ORDER — SODIUM CHLORIDE 0.9 % IV SOLN
INTRAVENOUS | Status: DC
Start: 2014-07-19 — End: 2014-07-20
  Administered 2014-07-19 (×2): via INTRAVENOUS

## 2014-07-19 NOTE — Progress Notes (Signed)
Utilization Review Completed.   Ebany Bowermaster, RN, BSN Nurse Case Manager  

## 2014-07-19 NOTE — Progress Notes (Signed)
Patient Name: Margaret [name]      SUBJECTIVE:admitted 9/25 with CHF exacerbation in context of CAD and/ CABG.. Had been referred for Myoview which was complicated by dyspnea which resolved and then acutely worsened  She was ultimately transferred [facility] where [facility]G >> acute respiratory failure with PO2 87/62/7.19  ? How much oxygen Rx with bipap X 24 hrs   Echo 8/15 normal LV function;  myoview images not available;  Last cath pre CABG   CXR this am is much improved  SEEN BY pulm yesterday    She feels much better on nasal O2   Past Medical History  Diagnosis Date  . CAD S/P percutaneous coronary angioplasty 2005    prior LAD PCI - in 2005, LAD stent 100% occluded (mid LAD).  Marland Kitchen Left main coronary artery disease 03/2010    Ostial left main 60, OM1 70, circumflex 50-60, mid LAD stent occluded, mid RCA 30-40, RV marginal 90, EF 45-50% --> CABG - (B[facility]) LIMA-LAD, SVG-D1, sSVG-OM1-OM2,  . Chronic diastolic CHF (congestive heart failure), NYHA class 2     Grade 2 Diastolic Dysfunction on Echo (hospitalized in [place] 01/2014 with CHF, no PTCA performed)  . Hx of echocardiogram 05/2014    EF 50-55%, inf-lat HK, Gr 2 DD, very mild AS (mean 10 mmHg), mild MR, PASP 36 mmHg; calcified density in LA (suggest TEE)  . Atherosclerotic peripheral vascular disease with intermittent claudication 04/2014    LEA Dopplers: RABI 1.02/ LABI: 0.61 (L SFA, Pop & DP blunted wave forms) -- correlates with L leg claudication  . Type 2 diabetes mellitus   . Hyperlipidemia with target LDL less than 70   . Vitamin B12 deficiency   . Thyroid disease   . RESPIRATORY ARREST assoc w MYOVIEW     Scheduled Meds:  Scheduled Meds: . antiseptic oral rinse  7 mL Mouth Rinse BID  . aspirin EC  81 mg Oral Daily  . atorvastatin  20 mg Oral q1800  . carvedilol  6.25 mg Oral BID WC  . clopidogrel  75 mg Oral Daily  . heparin  5,000 Units Subcutaneous 3 times per day  . Influenza vac split quadrivalent PF  0.5 mL  Intramuscular Tomorrow-1000  . insulin aspart  0-9 Units Subcutaneous TID WC  . levothyroxine  125 mcg Oral QAC breakfast  . pneumococcal 23 valent vaccine  0.5 mL Intramuscular Tomorrow-1000  . sodium chloride  3 mL Intravenous Q12H   Continuous Infusions:  sodium chloride, acetaminophen, ondansetron (ZOFRAN) IV, sodium chloride    PHYSICAL EXAM Filed Vitals:   07/19/14 0403 07/19/14 0500 07/19/14 0600 07/19/14 0700  BP: 113/46 106/45 121/44 115/48  Pulse: 109 111 114 112  Temp:    98.4 F (36.9 C)  TempSrc:      Resp: 18 19 19 20   Height:      Weight:      SpO2: 100% 100% 99% 100%    Well developed and nourished in no acute distress HENT normal Neck supple with JVP-flat Rales, ronchi Regular rate and rhythm, no murmurs or gallops Abd-soft with active BS No Clubbing cyanosis rt edema Skin-warm and dry A & Oriented  Grossly normal sensory and motor function   TELEMETRY: Reviewed telemetry pt  sinus    Intake/Output Summary (Last 24 hours) at 07/19/14 0902 Last data filed at 07/19/14 0600  Gross per 24 hour  Intake    603 ml  Output   1510 ml  Net   -  907 ml    LABS: Basic Metabolic Panel:  Recent Labs Lab 07/17/14 1253 07/18/14 0109 07/19/14 0324  NA 136* 138 140  K 5.1 4.2 4.3  CL 100 100 97  CO2 GLUCOSE 315* 157* 132*  BUN 24* 28* 35*  CREATININE 1.24* 1.21* 1.43*  CALCIUM 9.2 9.1 9.2   Cardiac Enzymes:  Recent Labs  07/17/14 1835 07/18/14 0109 07/18/14 0620  TROPONINI <0.30 <0.30 <0.30   CBC:  Recent Labs Lab 07/17/14 1253 07/18/14 0109  WBC 17.5* 9.2  NEUTROABS 10.4* 8.2*  HGB 11.3* 10.1*  HCT 35.5* 31.1*  MCV 94.7 92.0  PLT 291 172   PROTIME: No results found for this basename: LABPROT, INR,  in the last 72 hours Liver Function Tests:  Recent Labs  07/18/14 0109  AST 17  ALT 9  ALKPHOS 54  BILITOT 0.4  PROT 7.3  ALBUMIN 3.4*   No results found for this basename: LIPASE, AMYLASE,  in the last 72  hours BNP: BNP (last 3 results)  Recent Labs  05/29/14 1137 07/17/14 1253 07/17/14 1835  PROBNP 393.0* 1966.0* 3446.0*   D-Dimer: No results found for this basename: DDIMER,  in the last 72 hours Hemoglobin A1C: No results found for this basename: HGBA1C,  in the last 72 hours Fasting Lipid Panel: No results found for this basename: CHOL, HDL, LDLCALC, TRIG, CHOLHDL, LDLDIRECT,  in the last 72 hours Thyroid Function Tests:  Recent Labs  07/17/14 1253  TSH 2.730   Anemia Panel: No results found for this basename: VITAMINB12, FOLATE, FERRITIN, TIBC, IRON, RETICCTPCT,  in the last 72 hours    ASSESSMENT AND PLAN:  Principal Problem:   Acute on chronic diastolic heart failure Active Problems:   Type 2 diabetes mellitus   Thyroid disorder   Left main coronary artery disease   Chronic diastolic CHF (congestive heart failure), NYHA class 2   Hyperlipidemia with target LDL less than 70   Acute respiratory failure with hypoxia and hypercapnia - secondary to Acute CHF & pulmonary edema   Acute pulmonary edema with congestive heart failure   Acute renal insufficiency  Much improved Will hold lasix overnight-- with low blood pressure and tach will give a little saline Transfer to stepdown myoview will not be helpful  Will pencil in for cath ( NPO but no orders) and ask JV to weigh in on cath Y/N -- also does she need renal arteries eval for acute pulm edema although the association has been with myoview procedure now twice  Signed, Sherryl Manges MD  07/19/2014

## 2014-07-19 NOTE — Progress Notes (Signed)
PULMONARY  / CRITICAL CARE MEDICINE CONSULTATION  Name: Margaret [name] MRN: [id number] DOB: [birth year]  PHYSICIAN REQUESTING CONSULT: Dr. Jim Like  REASON FOR CONSULT: respiratory failure    ADMISSION DATE:  07/17/2014  CHIEF COMPLAINT:  Dyspnea   BRIEF PATIENT DESCRIPTION:    This is a 80 year old woman with history of CAD who was seen as an outpatient for a stress test today.  Her BB and lasix had been held for the procedure.  She developed dyspnea shortly after her lexiscan injection, reportedly also associated with diarrhea x2.  She was found to be tachycardic with exam concerning for pulmonary edema.  She was transported to the ED, where she was found to have significant hypoxia/hypercarbia, and CXR confirmed edema.  She was placed on BIPAP and cardiology was consulted in addition to CCM.  THe patient is unable to give a history, given that she is on BIPAP and does not speak [place].  However, from records it appears she had endorsed some PND and pillow orthopnea prior to presentation.  During my exam, she was sleepy but was able to deny discomfort in her chest and her abdomen.     has a past medical history of CAD S/P percutaneous coronary angioplasty (2005); Left main coronary artery disease (03/2010); Chronic diastolic CHF (congestive heart failure), NYHA class 2; echocardiogram (05/2014); Atherosclerotic peripheral vascular disease with intermittent claudication (04/2014); Type 2 diabetes mellitus; Hyperlipidemia with target LDL less than 70; Vitamin T14 deficiency; Thyroid disease; and RESPIRATORY ARREST assoc w MYOVIEW.   has past surgical history that includes Total knee arthroplasty; Cardiac catheterization (2005); Cardiac catheterization (03/2010); and Coronary artery bypass graft (03/2010).   SIGNIFICANT EVENTS / STUDIES:  1.  07/17/14 CXR with pulmonary edema, initial ABG 7.19 / 61.8 / 87  LINES /  TUBES: 1. PIV  CULTURES: 1. None  ANTIBIOTICS: 1. None   SUBJECTIVE/OVERNIGHT/INTERVAL HX 07/18/14: Improved. Off bipap this morning but still with congested cough  07/19/14: Used bipap empirically last night. This AM even better resp stand point. More awake and feeling better. Family at bedside  PHYSICAL EXAM VITAL SIGNS: Temp:  [97.9 F (36.6 C)-98.4 F (36.9 C)] 98.4 F (36.9 C) (09/27 0700) Pulse Rate:  [79-114] 103 (09/27 0900) Resp:  [14-30] 19 (09/27 0900) BP: (70-121)/(25-50) 93/32 mmHg (09/27 0900) SpO2:  [75 %-100 %] 100 % (09/27 0900) Weight:  [62.9 kg (138 lb 10.7 oz)] 62.9 kg (138 lb 10.7 oz) (09/27 0300)  HEMODYNAMICS:    VENTILATOR SETTINGS:   : General:  Thin, frail appearing woman in no acute distress,  Neuro:  Moves all extremities, localizes to pain, no focal deficits noted HEENT: nasal cannula opn Neck:  Supple, nontender, JVD 7cm Cardiovascular:  Tachycardic, sounds regular, no murmurs Lungs:  Improved crackles; only faint ones at base. No distress Abdomen:  Soft, nontender, nondistended. +bowel sounds Musculoskeletal:  Decreased muscle bulk; no obvious joint effusions Skin:  Dry, warm, intact; no rashes  INS/OUTS: Intake/Output     09/26 0701 - 09/27 0700 09/27 0701 - 09/28 0700   P.O. 720    I.V. (mL/kg) 3 (0)    Total Intake(mL/kg) 723 (11.5)    Urine (mL/kg/hr) 1510 (1) 45 (0.2)   Total Output 1510 45   Net -787 -45         PULMONARY  Recent Labs Lab 07/17/14 1433 07/17/14 1602 07/17/14 1908  PHART 7.189* 7.274* 7.338*  PCO2ART 61.8* 52.3* 44.9  PO2ART 87.0 165.0* 109.0*  HCO3 23.5 24.3* 24.2*  TCO2  O2SAT 94.0 99.0 98.0    CBC  Recent Labs Lab 07/17/14 1253 07/18/14 0109  HGB 11.3* 10.1*  HCT 35.5* 31.1*  WBC 17.5* 9.2  PLT 291 172    COAGULATION No results found for this basename: INR,  in the last 168 hours  CARDIAC    Recent Labs Lab 07/17/14 1253 07/17/14 1835 07/18/14 0109 07/18/14 0620   TROPONINI <0.30 <0.30 <0.30 <0.30    Recent Labs Lab 07/17/14 1253 07/17/14 1835  PROBNP 1966.0* 3446.0*     CHEMISTRY  Recent Labs Lab 07/17/14 1253 07/18/14 0109 07/19/14 0324  NA 136* 138 140  K 5.1 4.2 4.3  CL 100 100 97  CO2 GLUCOSE 315* 157* 132*  BUN 24* 28* 35*  CREATININE 1.24* 1.21* 1.43*  CALCIUM 9.2 9.1 9.2   Estimated Creatinine Clearance: 26.9 ml/min (by C-G formula based on Cr of 1.43).   LIVER  Recent Labs Lab 07/18/14 0109  AST 17  ALT 9  ALKPHOS 54  BILITOT 0.4  PROT 7.3  ALBUMIN 3.4*     INFECTIOUS  Recent Labs Lab 07/17/14 1310  LATICACIDVEN 1.84     ENDOCRINE CBG (last 3)   Recent Labs  07/18/14 1637 07/18/14 2139 07/19/14 0737  GLUCAP 179* 183* 189*         IMAGING x48h Dg Chest Port 1 View  07/18/2014   CLINICAL DATA:  Pulmonary edema.  EXAM: PORTABLE CHEST - 1 VIEW  COMPARISON:  07/17/2014  FINDINGS: Lungs are adequately inflated with significant interval improvement in the previously in diffuse bilateral airspace process likely improving interstitial edema. There is persistent more focal airspace opacification and lung bases which may be due to persistent asymmetric edema although cannot exclude infection. Cardiomediastinal silhouette and remainder of the exam is unchanged.  IMPRESSION: Significant interval improvement likely improved interstitial edema. More focal airspace opacification over the lung bases persists which may be due to asymmetric edema versus infection.   Electronically Signed   By: Elberta Fortis M.D.   On: 07/18/2014 07:44      ASSESSMENT / PLAN: Principal Problem:   Acute on chronic diastolic heart failure Active Problems:   Type 2 diabetes mellitus   Thyroid disorder   Left main coronary artery disease   Chronic diastolic CHF (congestive heart failure), NYHA class 2   Hyperlipidemia with target LDL less than 70   Acute respiratory failure with hypoxia and hypercapnia -  secondary to Acute CHF & pulmonary edema   Acute pulmonary edema with congestive heart failure   Acute renal insufficiency   Pulmonary: A:  Cardiogenic pulmonary edema due to acute on chronic diastolic CHF. No MI Hypoxic and Hypercarbic Acute respiratory failure due to above   - improved clinically and radiologically. off bipap this AM    P: [place] for pulse ox > 88% DC bipap qhs Diureses per cards Monitor for signs of infection  Aspiration precautions - use sedatives with caution     FAMILY 07/19/14: updated daughter at bedside and husband   PCCM wil sign off    Dr. Kalman Shan, M.D., [facility].C.P Pulmonary and Critical Care Medicine Staff Physician [facility] Pulmonary and Critical Care Pager: [phone], If no answer or between  15:00h - 7:00h: call [phone]  07/19/2014 10:41 AM

## 2014-07-20 LAB — BASIC METABOLIC PANEL
Anion gap: 13 (ref 5–15)
BUN: 32 mg/dL — AB (ref 6–23)
CALCIUM: 8.5 mg/dL (ref 8.4–10.5)
CO2: 26 mEq/L (ref 19–32)
Chloride: 97 mEq/L (ref 96–112)
Creatinine, Ser: 1.24 mg/dL — ABNORMAL HIGH (ref 0.50–1.10)
GFR, EST AFRICAN AMERICAN: 47 mL/min — AB (ref >90)
GFR, EST NON AFRICAN AMERICAN: 41 mL/min — AB (ref >90)
Glucose, Bld: 196 mg/dL — ABNORMAL HIGH (ref 70–99)
Potassium: 3.9 mEq/L (ref 3.7–5.3)
SODIUM: 136 meq/L — AB (ref 137–147)

## 2014-07-20 LAB — GLUCOSE, CAPILLARY
GLUCOSE-CAPILLARY: 254 mg/dL — AB (ref 70–99)
GLUCOSE-CAPILLARY: 216 mg/dL — AB (ref 70–99)
GLUCOSE-CAPILLARY: 208 mg/dL — AB (ref 70–99)
Glucose-Capillary: 165 mg/dL — ABNORMAL HIGH (ref 70–99)

## 2014-07-20 LAB — MAGNESIUM: MAGNESIUM: 1.8 mg/dL (ref 1.5–2.5)

## 2014-07-20 LAB — PHOSPHORUS: PHOSPHORUS: 2.3 mg/dL (ref 2.3–4.6)

## 2014-07-20 MED ORDER — FUROSEMIDE 10 MG/ML IJ SOLN
INTRAMUSCULAR | Status: AC
  Filled 2014-07-20: qty 4

## 2014-07-20 MED ORDER — FUROSEMIDE 20 MG PO TABS
20.0000 mg | ORAL_TABLET | Freq: Every day | ORAL | Status: DC
  Administered 2014-07-20 – 2014-07-22 (×3): 20 mg via ORAL
  Filled 2014-07-20 (×5): qty 1

## 2014-07-20 NOTE — Progress Notes (Signed)
Inpatient Diabetes Program Recommendations  AACE/ADA: New Consensus Statement on Inpatient Glycemic Control (2013)  Target Ranges:  Prepandial:   less than 140 mg/dL      Peak postprandial:   less than 180 mg/dL (1-2 hours)      Critically ill patients:  140 - 180 mg/dL  Results for EMMANUELLE, HAMANN (MRN [id number]) as of 07/20/2014 11:37  Ref. Range 07/19/2014 07:37 07/19/2014 13:34 07/19/2014 17:11 07/19/2014 21:31 07/20/2014 07:51  Glucose-Capillary Latest Range: 70-99 mg/dL 700 (H) 174 (H) 944 (H) 203 (H) 254 (H)   Inpatient Diabetes Program Recommendations Insulin - Basal: consider adding Lantus 10 units daily (first dose now) Order A1C to assess prehospital glucose control. Thank you  Piedad Climes BSN, RN,CDE Inpatient Diabetes Coordinator [phone] (team pager)

## 2014-07-20 NOTE — Progress Notes (Signed)
Patient is alert and oriented, was able to answer all questions. Husband at bedside. No language barrier experienced with me due to my fluency in her native language, hindi. Patient walked in hallway with walker, no complaints of pain.

## 2014-07-20 NOTE — Progress Notes (Signed)
    Margaret [name] was admitted with shortness of breath. I made a courtesy visit today. She was short of breath in the morning. She improved in the afternoon. I spoke to both her son and her daughter. We discussed cardiac catheterization. We are all in agreement that she should have a cardiac cath to rule out ischemia. She is unable to tolerate nuclear stress testing. She had a bad reaction with lexiscan.   I think the cath should be done before she leaves the hospital. She seems to be approaching euvolemic. I will make her n.p.o. past midnight. She could potentially be added to the cath schedule if she is able to lie flat.  [name] S.

## 2014-07-20 NOTE — Progress Notes (Addendum)
    Subjective:  Denies CP; dyspnea improved but continues with exertion   Objective:  Filed Vitals:   07/20/14 0353 07/20/14 0500 07/20/14 0700 07/20/14 0800  BP: 97/92 115/80 113/50 111/46  Pulse:  90 101 96  Temp: 97.2 F (36.2 C)  97.5 F (36.4 C)   TempSrc: Oral  Oral   Resp:  14 21 14   Height:      Weight: 140 lb 3.4 oz (63.6 kg)     SpO2:  98% 95% 98%    Intake/Output from previous day:  Intake/Output Summary (Last 24 hours) at 07/20/14 0905 Last data filed at 07/20/14 0800  Gross per 24 hour  Intake    479 ml  Output    650 ml  Net   -171 ml    Physical Exam: Physical exam: Well-developed well-nourished in no acute distress.  Skin is warm and dry.  HEENT is normal.  Neck is supple.  Chest minimal basilar crackles Cardiovascular exam is regular rate and rhythm.  Abdominal exam nontender or distended. No masses palpated. Extremities show no edema. neuro grossly intact    Lab Results: Basic Metabolic Panel:  Recent Labs  59/74/71 0324 07/20/14 0335  NA 140 136*  K 4.3 3.9  CL 97 97  CO2 26 26  GLUCOSE 132* 196*  BUN 35* 32*  CREATININE 1.43* 1.24*  CALCIUM 9.2 8.5  MG  --  1.8  PHOS  --  2.3   CBC:  Recent Labs  07/17/14 1253 07/18/14 0109  WBC 17.5* 9.2  NEUTROABS 10.4* 8.2*  HGB 11.3* 10.1*  HCT 35.5* 31.1*  MCV 94.7 92.0  PLT 291 172   Cardiac Enzymes:  Recent Labs  07/17/14 1835 07/18/14 0109 07/18/14 0620  TROPONINI <0.30 <0.30 <0.30     Assessment/Plan:  1 acute on chronic diastolic heart failure-symptoms have improved. She still has some dyspnea on exertion. Add Lasix 20 mg daily. Follow renal function closely. Note there is concern that event may have been ischemia mediated. Stress images during recent nuclear study were not able to be obtained because of acute dyspnea. I discussed with Dr Eldridge Dace this AM. We feel cardiac catheterization is indicated. I discussed the risks and benefits this morning. The patient's  daughter states she would prefer not to proceed at this point. She would prefer to be discharged once patient is stable. Catheterization had been performed in 2-3 weeks by Dr. Eldridge Dace. I also explained the risks of undiagnosed coronary disease and postponing the procedure. She understands and would prefer to wait for several weeks. 2 peripheral vascular disease-continue aspirin and statin. 3 hypertension-blood pressure is borderline. I will hold carvedilol today. 4 Acute kidney failure-BUN and creatinine mildly increased with diuresis. Follow closely with reinitiation of Lasix. 5 history of coronary disease, continue aspirin and statin. Plan transfer to telemetry. Discharge home when CHF symptoms improve and followup with Dr. Eldridge Dace to consider outpatient cath. Olga Millers 07/20/2014, 9:05 AM Addendum-systolic blood pressure was 120 and carvedilol had already been given this morning. Continue present dose and watch blood pressure. Olga Millers

## 2014-07-20 NOTE — Progress Notes (Signed)
Report called to receiving RN, Bedside report given pt VS WNL upon transfer. Pt with no complaints of pain, belongings and family at bedside. Safety maintained.

## 2014-07-20 NOTE — Progress Notes (Addendum)
Pt blood pressure has been continuously low in the 80's/30-55's. HR in the 90's. Pt is non symptomatic. Dr Arlyn Leak informed. No new orders at the time. Will continue to monitor pt closely.

## 2014-07-21 ENCOUNTER — Telehealth: Payer: Self-pay | Admitting: Interventional Cardiology

## 2014-07-21 DIAGNOSIS — E1159 Type 2 diabetes mellitus with other circulatory complications: Secondary | ICD-10-CM

## 2014-07-21 DIAGNOSIS — I2510 Atherosclerotic heart disease of native coronary artery without angina pectoris: Secondary | ICD-10-CM

## 2014-07-21 LAB — CBC
HCT: 24.7 % — ABNORMAL LOW (ref 36.0–46.0)
HEMATOCRIT: 25.3 % — AB (ref 36.0–46.0)
HEMOGLOBIN: 8.1 g/dL — AB (ref 12.0–15.0)
Hemoglobin: 8.3 g/dL — ABNORMAL LOW (ref 12.0–15.0)
MCH: 30.7 pg (ref 26.0–34.0)
MCH: 30.1 pg (ref 26.0–34.0)
MCHC: 32.8 g/dL (ref 30.0–36.0)
MCHC: 32.8 g/dL (ref 30.0–36.0)
MCV: 93.6 fL (ref 78.0–100.0)
MCV: 91.7 fL (ref 78.0–100.0)
PLATELETS: 123 10*3/uL — AB (ref 150–400)
Platelets: 127 10*3/uL — ABNORMAL LOW (ref 150–400)
RBC: 2.64 MIL/uL — AB (ref 3.87–5.11)
RBC: 2.76 MIL/uL — AB (ref 3.87–5.11)
RDW: 12.8 % (ref 11.5–15.5)
RDW: 12.9 % (ref 11.5–15.5)
WBC: 5.1 10*3/uL (ref 4.0–10.5)
WBC: 4.9 10*3/uL (ref 4.0–10.5)

## 2014-07-21 LAB — BASIC METABOLIC PANEL
Anion gap: 13 (ref 5–15)
BUN: 23 mg/dL (ref 6–23)
CHLORIDE: 103 meq/L (ref 96–112)
CO2: 23 mEq/L (ref 19–32)
CREATININE: 1.02 mg/dL (ref 0.50–1.10)
Calcium: 8.4 mg/dL (ref 8.4–10.5)
GFR calc non Af Amer: 51 mL/min — ABNORMAL LOW (ref >90)
GFR, EST AFRICAN AMERICAN: 59 mL/min — AB (ref >90)
GLUCOSE: 231 mg/dL — AB (ref 70–99)
POTASSIUM: 4.7 meq/L (ref 3.7–5.3)
Sodium: 139 mEq/L (ref 137–147)

## 2014-07-21 LAB — GLUCOSE, CAPILLARY
GLUCOSE-CAPILLARY: 233 mg/dL — AB (ref 70–99)
Glucose-Capillary: 211 mg/dL — ABNORMAL HIGH (ref 70–99)
Glucose-Capillary: 285 mg/dL — ABNORMAL HIGH (ref 70–99)
Glucose-Capillary: 88 mg/dL (ref 70–99)

## 2014-07-21 LAB — PHOSPHORUS: Phosphorus: 2 mg/dL — ABNORMAL LOW (ref 2.3–4.6)

## 2014-07-21 LAB — MAGNESIUM: Magnesium: 2 mg/dL (ref 1.5–2.5)

## 2014-07-21 MED ORDER — SODIUM CHLORIDE 0.9 % IJ SOLN
3.0000 mL | Freq: Two times a day (BID) | INTRAMUSCULAR | Status: DC
  Administered 2014-07-22: 3 mL via INTRAVENOUS

## 2014-07-21 MED ORDER — SODIUM CHLORIDE 0.9 % IV SOLN: 250.0000 mL | INTRAVENOUS | Status: DC | PRN

## 2014-07-21 MED ORDER — SODIUM CHLORIDE 0.9 % IV SOLN
1.0000 mL/kg/h | INTRAVENOUS | Status: DC
  Administered 2014-07-22: 1 mL/kg/h via INTRAVENOUS

## 2014-07-21 MED ORDER — SODIUM CHLORIDE 0.9 % IJ SOLN: 3.0000 mL | INTRAMUSCULAR | Status: DC | PRN

## 2014-07-21 MED ORDER — INSULIN ASPART 100 UNIT/ML ~~LOC~~ SOLN
0.0000 [IU] | Freq: Three times a day (TID) | SUBCUTANEOUS | Status: DC
  Administered 2014-07-21: 8 [IU] via SUBCUTANEOUS
  Administered 2014-07-22: 5 [IU] via SUBCUTANEOUS

## 2014-07-21 NOTE — Progress Notes (Signed)
       Patient Name: Margaret [name] Date of Encounter: 07/21/2014    SUBJECTIVE:Spoke to patient thru interpreter (daughter). She denies dyspnea. No chest pain. Lab this AM revealed Hgb 8.1.  Daughter and family now feels they need to speak to Dr. JV before consenting to cath today.  TELEMETRY:  NSR Filed Vitals:   07/20/14 1617 07/20/14 2100 07/21/14 0643 07/21/14 1013  BP: 105/42 96/47 108/49 122/65  Pulse: 96 87 86 86  Temp:  98.7 F (37.1 C) 97.7 F (36.5 C)   TempSrc:  Oral Oral   Resp:  22 18   Height:      Weight:   141 lb 12.1 oz (64.3 kg)   SpO2:  98% 98%     Intake/Output Summary (Last 24 hours) at 07/21/14 1104 Last data filed at 07/20/14 1700  Gross per 24 hour  Intake    240 ml  Output    400 ml  Net   -160 ml   LABS: Basic Metabolic Panel:  Recent Labs  07/20/14 0335 07/21/14 0445  NA 136* 139  K 3.9 4.7  CL 97 103  CO2 26 23  GLUCOSE 196* 231*  BUN 32* 23  CREATININE 1.24* 1.02  CALCIUM 8.5 8.4  MG 1.8 2.0  PHOS 2.3 2.0*   CBC:  Recent Labs  07/21/14 0445  WBC 5.1  HGB 8.1*  HCT 24.7*  MCV 93.6  PLT 127*    Radiology/Studies:  No new data  Physical Exam: Blood pressure 122/65, pulse 86, temperature 97.7 F (36.5 C), temperature source Oral, resp. rate 18, height 5' (1.524 m), weight 141 lb 12.1 oz (64.3 kg), SpO2 98.00%. Weight change: 14.1 oz (0.4 kg)  Wt Readings from Last 3 Encounters:  07/21/14 141 lb 12.1 oz (64.3 kg)  07/17/14 142 lb (64.411 kg)  06/16/14 139 lb 12.8 oz (63.413 kg)    Chest is clear Cardiac exam is unchanged. Extremities reveal no edema.  ASSESSMENT:  1. Acute on chronic diastolic heart failure, improved. ? ischemia mediated. 2. CAD and possible ischemia mediated HF 3. Type 2 diabetes mellitus with vascular complications 4. Anemia with hemoglobin drop from 11 to 8.1 since admission. ? Real vs lab error.  Plan:  1. Cancel cath today 2. Speak with Dr. JV 3. Schedule cath for AM  Signed, SMITH  III,HENRY W 07/21/2014, 11:04 AM 

## 2014-07-21 NOTE — Telephone Encounter (Signed)
New message    For Dr Eldridge Dace Pt is in hospital at [facility]----talk to Dr Eldridge Dace about the procedure pt needs to have

## 2014-07-21 NOTE — Telephone Encounter (Signed)
To Dr. Varanasi. 

## 2014-07-21 NOTE — Progress Notes (Signed)
Dropping hemoglobin and platelet count have caused [place] to hold heparin. Will add TED hose. Check a HIT panel.

## 2014-07-21 NOTE — Progress Notes (Signed)
Inpatient Diabetes Program Recommendations  AACE/ADA: New Consensus Statement on Inpatient Glycemic Control (2013)  Target Ranges:  Prepandial:   less than 140 mg/dL      Peak postprandial:   less than 180 mg/dL (1-2 hours)      Critically ill patients:  140 - 180 mg/dL  Results for KRISSIA, RALSTON (MRN [id number]) as of 07/21/2014 13:37  Ref. Range 07/20/2014 12:18 07/20/2014 15:45 07/20/2014 20:08 07/21/2014 06:20 07/21/2014 11:08  Glucose-Capillary Latest Range: 70-99 mg/dL 188 (H) 416 (H) 606 (H) 233 (H) 211 (H)   Inpatient Diabetes Program Recommendations Insulin - Basal: consider adding Lantus 10 units daily (first dose now) Thank you  Piedad Climes BSN, RN,CDE Inpatient Diabetes Coordinator [phone] (team pager)

## 2014-07-22 ENCOUNTER — Encounter (HOSPITAL_COMMUNITY): Payer: Managed Care, Other (non HMO)

## 2014-07-22 ENCOUNTER — Encounter (HOSPITAL_COMMUNITY)
Admission: EM | Disposition: A | Discharge status: Home Health | Payer: Self-pay | Source: Home / Self Care | Attending: Interventional Cardiology

## 2014-07-22 DIAGNOSIS — I2510 Atherosclerotic heart disease of native coronary artery without angina pectoris: Secondary | ICD-10-CM

## 2014-07-22 HISTORY — PX: LEFT HEART CATHETERIZATION WITH CORONARY/GRAFT ANGIOGRAM: SHX5450

## 2014-07-22 LAB — CBC
HEMATOCRIT: 25.8 % — AB (ref 36.0–46.0)
Hemoglobin: 8.3 g/dL — ABNORMAL LOW (ref 12.0–15.0)
MCH: 29.9 pg (ref 26.0–34.0)
MCHC: 32.2 g/dL (ref 30.0–36.0)
MCV: 92.8 fL (ref 78.0–100.0)
Platelets: 136 10*3/uL — ABNORMAL LOW (ref 150–400)
RBC: 2.78 MIL/uL — AB (ref 3.87–5.11)
RDW: 13 % (ref 11.5–15.5)
WBC: 4.9 10*3/uL (ref 4.0–10.5)

## 2014-07-22 LAB — POCT I-STAT 3, ART BLOOD GAS (G3+)
Acid-base deficit: 1 mmol/L (ref 0.0–2.0)
Bicarbonate: 24.1 mEq/L — ABNORMAL HIGH (ref 20.0–24.0)
O2 Saturation: 92 %
PH ART: 7.372 (ref 7.350–7.450)
TCO2: 25 mmol/L (ref 0–100)
pCO2 arterial: 41.4 mmHg (ref 35.0–45.0)
pO2, Arterial: 65 mmHg — ABNORMAL LOW (ref 80.0–100.0)

## 2014-07-22 LAB — POCT I-STAT 3, VENOUS BLOOD GAS (G3P V)
Acid-base deficit: 1 mmol/L (ref 0.0–2.0)
BICARBONATE: 24.4 meq/L — AB (ref 20.0–24.0)
O2 Saturation: 61 %
PH VEN: 7.346 — AB (ref 7.250–7.300)
PO2 VEN: 34 mmHg (ref 30.0–45.0)
TCO2: 26 mmol/L (ref 0–100)
pCO2, Ven: 44.6 mmHg — ABNORMAL LOW (ref 45.0–50.0)

## 2014-07-22 LAB — PROTIME-INR
INR: 1.04 (ref 0.00–1.49)
Prothrombin Time: 13.6 seconds (ref 11.6–15.2)

## 2014-07-22 LAB — GLUCOSE, CAPILLARY
GLUCOSE-CAPILLARY: 160 mg/dL — AB (ref 70–99)
GLUCOSE-CAPILLARY: 217 mg/dL — AB (ref 70–99)
Glucose-Capillary: 231 mg/dL — ABNORMAL HIGH (ref 70–99)
Glucose-Capillary: 175 mg/dL — ABNORMAL HIGH (ref 70–99)
Glucose-Capillary: 199 mg/dL — ABNORMAL HIGH (ref 70–99)

## 2014-07-22 LAB — PHOSPHORUS: Phosphorus: 2.5 mg/dL (ref 2.3–4.6)

## 2014-07-22 LAB — OCCULT BLOOD X 1 CARD TO LAB, STOOL: Fecal Occult Bld: NEGATIVE

## 2014-07-22 LAB — MAGNESIUM: Magnesium: 2 mg/dL (ref 1.5–2.5)

## 2014-07-22 SURGERY — LEFT HEART CATHETERIZATION WITH CORONARY/GRAFT ANGIOGRAM
Anesthesia: LOCAL

## 2014-07-22 MED ORDER — LISINOPRIL 5 MG PO TABS
5.0000 mg | ORAL_TABLET | Freq: Every day | ORAL | Status: DC
Start: 2014-07-22 — End: 2014-07-24
  Administered 2014-07-22 – 2014-07-24 (×2): 5 mg via ORAL
  Filled 2014-07-22 (×3): qty 1

## 2014-07-22 MED ORDER — FUROSEMIDE 10 MG/ML IJ SOLN
20.0000 mg | Freq: Two times a day (BID) | INTRAMUSCULAR | Status: DC
  Administered 2014-07-22 – 2014-07-23 (×3): 20 mg via INTRAVENOUS
  Filled 2014-07-22 (×5): qty 2

## 2014-07-22 MED ORDER — CARVEDILOL 12.5 MG PO TABS
12.5000 mg | ORAL_TABLET | Freq: Two times a day (BID) | ORAL | Status: DC
  Administered 2014-07-22 – 2014-07-24 (×3): 12.5 mg via ORAL
  Filled 2014-07-22 (×6): qty 1

## 2014-07-22 MED ORDER — FUROSEMIDE 10 MG/ML IJ SOLN
INTRAMUSCULAR | Status: AC
  Filled 2014-07-22: qty 4

## 2014-07-22 MED ORDER — HEPARIN (PORCINE) IN NACL 2-0.9 UNIT/ML-% IJ SOLN
INTRAMUSCULAR | Status: AC
  Filled 2014-07-22: qty 1000

## 2014-07-22 MED ORDER — FENTANYL CITRATE 0.05 MG/ML IJ SOLN
INTRAMUSCULAR | Status: AC
  Filled 2014-07-22: qty 2

## 2014-07-22 MED ORDER — INSULIN ASPART 100 UNIT/ML ~~LOC~~ SOLN
0.0000 [IU] | Freq: Three times a day (TID) | SUBCUTANEOUS | Status: DC
  Administered 2014-07-22 – 2014-07-24 (×3): 3 [IU] via SUBCUTANEOUS
  Administered 2014-07-24: 5 [IU] via SUBCUTANEOUS

## 2014-07-22 MED ORDER — LIDOCAINE HCL (PF) 1 % IJ SOLN
INTRAMUSCULAR | Status: AC
  Filled 2014-07-22: qty 30

## 2014-07-22 MED ORDER — MIDAZOLAM HCL 2 MG/2ML IJ SOLN
INTRAMUSCULAR | Status: AC
  Filled 2014-07-22: qty 2

## 2014-07-22 NOTE — Progress Notes (Signed)
Labs are stable today. I spoke to Dr. Eldridge Dace and we feel proceeding with diagnostic cath is reasonable to fully define substrate and answer question of whether the ADHF episodes are ischemic. She had falling platelet count on heparin and may have heparin sensitivity. I ordered a HIT panel which is not back. Platelets have rebounded this AM.

## 2014-07-22 NOTE — CV Procedure (Signed)
Cardiac Catheterization Procedure Note  Name: Sayge Blickenstaff MRN: [id number] DOB: 06/15/1934  Procedure: Right Heart Cath, Left Heart Cath, Selective Coronary Angiography, SVG/LIMA angiography,  LV angiography No sedation was given. Contrast:80 ml  Indication:  Unstable angina and heart failure with previous CABG   Procedural Details: This was initially scheduled to the left heart cath only. However, given the findings of severe mitral regurgitation and severely elevated filling pressures, I decided to perform a right heart catheterization. The right groin was prepped, draped, and anesthetized with 1% lidocaine. Using the modified Seldinger technique a 5 French sheath was placed in the right femoral artery and a 7 French sheath was placed in the right femoral vein. A Swan-Ganz catheter was used for the right heart catheterization. Standard protocol was followed for recording of right heart pressures and sampling of oxygen saturations. Fick cardiac output was calculated. Standard Judkins catheters were used for selective coronary angiography and left ventriculography. JL 3.5 was used to engage the left main coronary artery. A JR 4 was used to engage the vein graft as well as LIMA. There were no immediate procedural complications. The patient was transferred to the post catheterization recovery area for further monitoring.  Procedural Findings: Hemodynamics RA 21 mmHg RV 66/12 mmHg PA 64/31 mmHg PCWP 32 mmHg.    giant V wave noted  LV 144/23  mmHg . LVEDP: 34mmHg AO 130/62  mmHg  Oxygen saturations: PA 61%  AO 92%   Cardiac Output (Fick) 6.12   Cardiac Index (Fick) 3.8   Aortic Valve: Peak to Peak gradient: 10  mmHg   Pulmonary vascular resistance (PVR): 2.2  Woods units.  Coronary angiography: Coronary dominance: Right    Left Main:  Heavily calcified with 50% ostial stenosis.   Left Anterior Descending (LAD):  Occluded proximally. A stent is noted.   Circumflex (LCx):  Medium in  size and nondominant. The vessel is subtotally occluded in the midsegment supplying a relatively small OM 2 and on 3. OM1 is normal in size with minor irregularities.     Right Coronary Artery: Normal in size and dominant. The vessel is mildly calcified with 60% mid stenosis. The rest of the vessel has minor irregularities.   Posterior descending artery: Normal in size with no significant disease.   Posterior AV segment: Normal in size with minor irregularities.   Posterolateral branchs:  Minor irregularities.   SVG to OM: Occluded at the ostium  SVG to diagonal: Patent with 20% ostial stenosis.  LIMA to LAD: Patent with no significant disease.   Left ventriculography: Left ventricular systolic function is mildly reduced , LVEF is estimated at 40-45% %, there is severe  mitral regurgitation With dilated left atrium.   Final Conclusions:   1. Severe underlying three-vessel coronary artery disease with patent SVG to OM 2 and on 3 . Moderate mid RCA stenosis which was not grafted. Patent LIMA to LAD, and SVG to diagonal.  2. mildly reduced LV systolic function with evidence of severe mitral regurgitation. 3. Severely elevated filling pressures with giant V waves noted on wedge pressure tracing.  Recommendations:  The patient likely has severe ischemic mitral regurgitation after occlusion of the SVG graft to OM. Filling pressures are severely elevated. She became more dyspneic at the end of the case and was given one dose of IV Lasix 40 mg. I will continue with 20 mg IV twice daily. I initiated small dose lisinopril. I recommend a transesophageal echocardiogram to evaluate the mitral valve.   Jerolyn CenterMuhammad  Kirke Corin MD, [facility] 07/22/2014, 2:31 PM

## 2014-07-22 NOTE — Interval H&P Note (Signed)
History and Physical Interval Note:  07/22/2014 1:46 PM  Margaret [name]  has presented today for surgery, with the diagnosis of cp  The various methods of treatment have been discussed with the patient and family. After consideration of risks, benefits and other options for treatment, the patient has consented to  Procedure(s): LEFT HEART CATHETERIZATION WITH CORONARY/GRAFT ANGIOGRAM (N/A) as a surgical intervention .  The patient's history has been reviewed, patient examined, no change in status, stable for surgery.  I have reviewed the patient's chart and labs.  Questions were answered to the patient's satisfaction.     Lorine Bears

## 2014-07-22 NOTE — Care Management Note (Addendum)
    Page 1 of 2   07/24/2014     1:23:35 PM CARE MANAGEMENT NOTE 07/24/2014  Patient:  Margaret [name], Margaret [name]   Account Number:  [id number]  Date Initiated:  07/22/2014  Documentation initiated by:  Donato Schultz  Subjective/Objective Assessment:   Acute on Chronic CHF     Action/Plan:   CM to follow for disposition needs   Anticipated DC Date:  07/24/2014   Anticipated DC Plan:  HOME W HOME HEALTH SERVICES      DC Planning Services  CM consult      [facility] Choice  HOME HEALTH   Choice offered to / List presented to:  C-4 Adult Children        HH arranged  HH-2 PT  HH-4 NURSE'S AIDE  HH-1 RN  HH-10 DISEASE MANAGEMENT      HH agency  [facility]   Status of service:  Completed, signed off Medicare Important Message given?  YES (If response is "NO", the following Medicare IM given date fields will be blank) Date Medicare IM given:  07/22/2014 Medicare IM given by:  HUTCHINSON,CRYSTAL Date Additional Medicare IM given:   Additional Medicare IM given by:    Discharge Disposition:  HOME W HOME HEALTH SERVICES  Per UR Regulation:  Reviewed for med. necessity/level of care/duration of stay  If discussed at Long Length of Stay Meetings, dates discussed:   07/23/2014    Comments:  Donato Schultz RN, BSN, MSHL, CCM  Nurse - Case Manager,  (Unit [facility])  [phone]  07/22/2014 Social:  From home with family Heart Cath scheduled for 07/22/2014 Dispo Plan:  pending.  10/2 0945 Margaret Sylvanus Telford rn,bsn spoke w pt and fam. gave them hhc agency list. hx of hhc but can't remember name of agency. no pref. ref to [facility]na w [facility] for hhpt and bath aid. fam aware that ins may not cover bath aid.

## 2014-07-22 NOTE — Progress Notes (Signed)
Site area: right groin a 5 fr sheath removed   Site Prior to Removal:  Level 0  Pressure Applied For 20 MINUTES    Minutes Beginning at 1505p  Manual:   Yes.    Patient Status During Pull:  Stable  Post Pull Groin Site:  Level 0  Post Pull Instructions Given:  Yes.    Post Pull Pulses Present:  Yes.    Dressing Applied:  Yes.    Comments:  VS remain stable during sheath pull.  Pt denies any pain at groin site at this time per interpretor.

## 2014-07-22 NOTE — H&P (View-Only) (Signed)
       Patient Name: Margaret [name] Date of Encounter: 07/21/2014    SUBJECTIVE:Spoke to patient thru interpreter (daughter). She denies dyspnea. No chest pain. Lab this AM revealed Hgb 8.1.  Daughter and family now feels they need to speak to Dr. Hedy Jacob before consenting to cath today.  TELEMETRY:  NSR Filed Vitals:   07/20/14 1617 07/20/14 2100 07/21/14 0643 07/21/14 1013  BP: 105/42 96/47 108/49 122/65  Pulse: 96 87 86 86  Temp:  98.7 F (37.1 C) 97.7 F (36.5 C)   TempSrc:  Oral Oral   Resp:  22 18   Height:      Weight:   141 lb 12.1 oz (64.3 kg)   SpO2:  98% 98%     Intake/Output Summary (Last 24 hours) at 07/21/14 1104 Last data filed at 07/20/14 1700  Gross per 24 hour  Intake    240 ml  Output    400 ml  Net   -160 ml   LABS: Basic Metabolic Panel:  Recent Labs  93/81/82 0335 07/21/14 0445  NA 136* 139  K 3.9 4.7  CL 97 103  CO2 26 23  GLUCOSE 196* 231*  BUN 32* 23  CREATININE 1.24* 1.02  CALCIUM 8.5 8.4  MG 1.8 2.0  PHOS 2.3 2.0*   CBC:  Recent Labs  07/21/14 0445  WBC 5.1  HGB 8.1*  HCT 24.7*  MCV 93.6  PLT 127*    Radiology/Studies:  No new data  Physical Exam: Blood pressure 122/65, pulse 86, temperature 97.7 F (36.5 C), temperature source Oral, resp. rate 18, height 5' (1.524 m), weight 141 lb 12.1 oz (64.3 kg), SpO2 98.00%. Weight change: 14.1 oz (0.4 kg)  Wt Readings from Last 3 Encounters:  07/21/14 141 lb 12.1 oz (64.3 kg)  07/17/14 142 lb (64.411 kg)  06/16/14 139 lb 12.8 oz (63.413 kg)    Chest is clear Cardiac exam is unchanged. Extremities reveal no edema.  ASSESSMENT:  1. Acute on chronic diastolic heart failure, improved. ? ischemia mediated. 2. CAD and possible ischemia mediated HF 3. Type 2 diabetes mellitus with vascular complications 4. Anemia with hemoglobin drop from 11 to 8.1 since admission. ? Real vs lab error.  Plan:  1. Cancel cath today 2. Speak with Dr. Hedy Jacob 3. Schedule cath for AM  Signed, Lesleigh Noe 07/21/2014, 11:04 AM

## 2014-07-23 DIAGNOSIS — I272 Pulmonary hypertension, unspecified: Secondary | ICD-10-CM

## 2014-07-23 DIAGNOSIS — I340 Nonrheumatic mitral (valve) insufficiency: Secondary | ICD-10-CM

## 2014-07-23 DIAGNOSIS — I509 Heart failure, unspecified: Secondary | ICD-10-CM

## 2014-07-23 LAB — HEPARIN INDUCED THROMBOCYTOPENIA PNL
Heparin Induced Plt Ab: NEGATIVE
PATIENT O. D.: 0.049
UFH HIGH DOSE UFH H: 0 %
UFH LOW DOSE 0.5 IU/ML: 0 %
UFH Low Dose 0.1 IU/mL: 0 % Release
UFH SRA RESULT: NEGATIVE

## 2014-07-23 LAB — GLUCOSE, CAPILLARY
GLUCOSE-CAPILLARY: 139 mg/dL — AB (ref 70–99)
Glucose-Capillary: 168 mg/dL — ABNORMAL HIGH (ref 70–99)
Glucose-Capillary: 166 mg/dL — ABNORMAL HIGH (ref 70–99)
Glucose-Capillary: 241 mg/dL — ABNORMAL HIGH (ref 70–99)

## 2014-07-23 LAB — PRO B NATRIURETIC PEPTIDE: PRO B NATRI PEPTIDE: 6332 pg/mL — AB (ref 0–450)

## 2014-07-23 LAB — BASIC METABOLIC PANEL
Anion gap: 12 (ref 5–15)
BUN: 23 mg/dL (ref 6–23)
CALCIUM: 9.1 mg/dL (ref 8.4–10.5)
CHLORIDE: 101 meq/L (ref 96–112)
CO2: 27 mEq/L (ref 19–32)
Creatinine, Ser: 1.18 mg/dL — ABNORMAL HIGH (ref 0.50–1.10)
GFR calc Af Amer: 50 mL/min — ABNORMAL LOW (ref >90)
GFR, EST NON AFRICAN AMERICAN: 43 mL/min — AB (ref >90)
Glucose, Bld: 169 mg/dL — ABNORMAL HIGH (ref 70–99)
POTASSIUM: 4.6 meq/L (ref 3.7–5.3)
SODIUM: 140 meq/L (ref 137–147)

## 2014-07-23 LAB — PHOSPHORUS: PHOSPHORUS: 3.7 mg/dL (ref 2.3–4.6)

## 2014-07-23 LAB — MAGNESIUM: Magnesium: 1.8 mg/dL (ref 1.5–2.5)

## 2014-07-23 MED ORDER — FENTANYL CITRATE 0.05 MG/ML IJ SOLN
12.5000 ug | Freq: Once | INTRAMUSCULAR | Status: AC
  Administered 2014-07-23: 12.5 ug via INTRAVENOUS

## 2014-07-23 MED ORDER — MIDAZOLAM HCL 2 MG/2ML IJ SOLN
INTRAMUSCULAR | Status: AC
  Administered 2014-07-23: 2 mg via INTRAVENOUS
  Filled 2014-07-23: qty 4

## 2014-07-23 MED ORDER — FENTANYL CITRATE 0.05 MG/ML IJ SOLN
INTRAMUSCULAR | Status: AC
  Administered 2014-07-23: 12.5 ug via INTRAVENOUS
  Filled 2014-07-23: qty 2

## 2014-07-23 MED ORDER — MIDAZOLAM HCL 2 MG/2ML IJ SOLN
2.0000 mg | Freq: Once | INTRAMUSCULAR | Status: AC
  Administered 2014-07-23: 2 mg via INTRAVENOUS

## 2014-07-23 MED ORDER — SODIUM CHLORIDE 0.9 % IV SOLN: INTRAVENOUS | Status: DC

## 2014-07-23 NOTE — Progress Notes (Addendum)
       Patient Name: Margaret [name] Date of Encounter: 07/23/2014    SUBJECTIVE:Had cath yesterday and demonstrated etiology of flash pulmonary edema to be occlusion of CFX graft with 3-4 + MR by LVG.  She wants to go home. Became dyspneic during cath and transferred to unit.  Now lying flat and wants to go home.  TELEMETRY:  NSR Filed Vitals:   07/23/14 0300 07/23/14 0351 07/23/14 0400 07/23/14 0500  BP: 85/38  101/45 98/43  Pulse: 79 79 85 81  Temp:  97.8 F (36.6 C)    TempSrc:  Oral    Resp: 15 15 16 14   Height:      Weight:  140 lb 3.4 oz (63.6 kg)    SpO2: 96% 95% 97% 97%    Intake/Output Summary (Last 24 hours) at 07/23/14 0754 Last data filed at 07/22/14 2100  Gross per 24 hour  Intake      0 ml  Output    525 ml  Net   -525 ml   LABS: Basic Metabolic Panel:  Recent Labs  81/85/90 0445 07/22/14 0439 07/23/14 0400  NA 139  --   --   K 4.7  --   --   CL 103  --   --   CO2 23  --   --   GLUCOSE 231*  --   --   BUN 23  --   --   CREATININE 1.02  --   --   CALCIUM 8.4  --   --   MG 2.0 2.0 1.8  PHOS 2.0* 2.5 3.7   CBC:  Recent Labs  07/21/14 1214 07/22/14 0546  WBC 4.9 4.9  HGB 8.3* 8.3*  HCT 25.3* 25.8*  MCV 91.7 92.8  PLT 123* 136*   I/O: cumulative 3.3 liters negative since admission  Radiology/Studies:  No new data  Physical Exam: Blood pressure 98/43, pulse 81, temperature 97.8 F (36.6 C), temperature source Oral, resp. rate 14, height 5' (1.524 m), weight 140 lb 3.4 oz (63.6 kg), SpO2 97.00%. Weight change: -2 lb 6.8 oz (-1.1 kg)  Wt Readings from Last 3 Encounters:  07/23/14 140 lb 3.4 oz (63.6 kg)  07/23/14 140 lb 3.4 oz (63.6 kg)  07/17/14 142 lb (64.411 kg)   Lying flat with no dyspnea. Cardiac with low pitched 2/6 systolic murmur at LLSB with ?minimal  Axillary radiation Chest is clear Neuro/Psych: anxious and demanding to go home   Catheterization reviewed and noted 3+ MR, severe pulmonary hypertension  (PASP 66 mmHg),  wedge 32, and LVEDP 34 mmHg  ASSESSMENT:  1. CAD with SVG to Cfx occlusion, of unknown duration 2. Surprisingly severe MR by LV gram, almost certainly related to ischemic papillary muscle dysfunction/?head rupture. 3. Acute on chronic diastolic heart failure due to # 1 and 2 4. Hypotension 5. Severe pulmonary hypertension.  Plan:  1. Needs TEE if patient and family consent, hopefully today if consents. Had long discussion with daughter who is on the way in. 2. Check BMET 3. Hold AM lasix  Prolonged visit due to phone conversation with daughter and extended discussion with pattient and husband who does not speak english but understands some.  Selinda Eon 07/23/2014, 7:54 AM

## 2014-07-23 NOTE — CV Procedure (Signed)
   Transesophageal Echocardiogram Note  Harman Mox [id number] 06/19/1934  Procedure: Transesophageal Echocardiogram Indications: ? Mitral chordal rupture  Procedure Details Consent: Obtained Time Out: Verified patient identification, verified procedure, site/side was marked, verified correct patient position, special equipment/implants available, Radiology Safety Procedures followed,  medications/allergies/relevent history reviewed, required imaging and test results available.  Performed  Medications: Fentanyl: 12.5 mg iv Versed: 2 mg iv  Low normal LV systolic function with grade 2 diastolic dysfunction and significantly elevated filling pressures.  Mild to moderate mitral regurgitation with no evidence of ruptured chordea. Moderate tricuspid regurgitation. At least moderate pulmonary hypertension (TR gradient 49 mmHg + RAP).  Please see TEE report for full description.   Complications: No apparent complications Patient did tolerate procedure well.  Lars Masson, MD, [facility] 07/23/2014, 3:05 PM

## 2014-07-23 NOTE — Progress Notes (Signed)
  Echocardiogram Echocardiogram Transesophageal has been performed.  Margaret [name] 07/23/2014, 4:51 PM

## 2014-07-24 LAB — CULTURE, BLOOD (ROUTINE X 2)
Culture: NO GROWTH
Culture: NO GROWTH

## 2014-07-24 LAB — BASIC METABOLIC PANEL
ANION GAP: 11 (ref 5–15)
BUN: 29 mg/dL — ABNORMAL HIGH (ref 6–23)
CALCIUM: 9.2 mg/dL (ref 8.4–10.5)
CHLORIDE: 97 meq/L (ref 96–112)
CO2: 29 meq/L (ref 19–32)
Creatinine, Ser: 1.46 mg/dL — ABNORMAL HIGH (ref 0.50–1.10)
GFR calc Af Amer: 39 mL/min — ABNORMAL LOW (ref >90)
GFR calc non Af Amer: 33 mL/min — ABNORMAL LOW (ref >90)
GLUCOSE: 268 mg/dL — AB (ref 70–99)
POTASSIUM: 5 meq/L (ref 3.7–5.3)
SODIUM: 137 meq/L (ref 137–147)

## 2014-07-24 LAB — GLUCOSE, CAPILLARY
Glucose-Capillary: 206 mg/dL — ABNORMAL HIGH (ref 70–99)
Glucose-Capillary: 198 mg/dL — ABNORMAL HIGH (ref 70–99)

## 2014-07-24 LAB — CBC
HEMATOCRIT: 27.2 % — AB (ref 36.0–46.0)
Hemoglobin: 8.7 g/dL — ABNORMAL LOW (ref 12.0–15.0)
MCH: 30.6 pg (ref 26.0–34.0)
MCHC: 32 g/dL (ref 30.0–36.0)
MCV: 95.8 fL (ref 78.0–100.0)
Platelets: 164 10*3/uL (ref 150–400)
RBC: 2.84 MIL/uL — AB (ref 3.87–5.11)
RDW: 13.2 % (ref 11.5–15.5)
WBC: 6.4 10*3/uL (ref 4.0–10.5)

## 2014-07-24 MED ORDER — LEVOTHYROXINE SODIUM 125 MCG PO TABS: 125.0000 ug | ORAL_TABLET | Freq: Every day | ORAL | Status: DC

## 2014-07-24 MED ORDER — FUROSEMIDE 40 MG PO TABS
40.0000 mg | ORAL_TABLET | Freq: Every day | ORAL | Status: DC
  Filled 2014-07-24: qty 1

## 2014-07-24 MED ORDER — CARVEDILOL 12.5 MG PO TABS: 12.5000 mg | ORAL_TABLET | Freq: Two times a day (BID) | ORAL | Status: DC

## 2014-07-24 MED ORDER — ROSUVASTATIN CALCIUM 10 MG PO TABS: 10.0000 mg | ORAL_TABLET | Freq: Every day | ORAL | Status: AC

## 2014-07-24 MED ORDER — FUROSEMIDE 40 MG PO TABS: 40.0000 mg | ORAL_TABLET | Freq: Every day | ORAL | Status: DC

## 2014-07-24 MED ORDER — LISINOPRIL 5 MG PO TABS: 5.0000 mg | ORAL_TABLET | Freq: Every day | ORAL | Status: DC

## 2014-07-24 MED ORDER — FUROSEMIDE 20 MG PO TABS: 20.0000 mg | ORAL_TABLET | Freq: Every day | ORAL | Status: DC

## 2014-07-24 NOTE — Progress Notes (Addendum)
I have adjusted meds. TEE showed mild to moderate MR and confirmed elevated PA pressure.  After morning meds and PT eval plan discharge today.

## 2014-07-24 NOTE — Discharge Summary (Signed)
I have adjusted meds. TEE showed mild to moderate MR and confirmed elevated PA pressure.  After morning meds and PT eval plan discharge today. The patient did well with ambulation. She was seen by physical therapy. She will have followup with Dr. Eldridge Dace less than 2 weeks. She will call of chest discomfort or dyspnea. She should have a basic metabolic panel done when she sees Dr. Eldridge Dace. The discharge note has been modified and is accurate. Discharge plans were made under my direction.

## 2014-07-24 NOTE — Discharge Summary (Signed)
CARDIOLOGY DISCHARGE SUMMARY   Patient ID: Margaret [name] MRN: [id number] DOB/AGE: 78-Jul-1935 85 y.o.  Admit date: 07/17/2014 Discharge date: 07/24/2014  PCP: Redmond Baseman, MD Primary Cardiologist: Dr. Abe People  Primary Discharge Diagnosis:  Acute pulmonary edema and Acute on chronic diastolic heart failure - weight 140 pounds at discharge  Secondary Discharge Diagnosis:    Type 2 diabetes mellitus   Thyroid disorder   Left main coronary artery disease   Chronic diastolic CHF (congestive heart failure), NYHA class 2   Hyperlipidemia with target LDL less than 70   Acute respiratory failure with hypoxia and hypercapnia - secondary to Acute CHF & pulmonary edema   Acute pulmonary edema with congestive heart failure   Acute renal insufficiency   Mitral valve regurgitation, ischemic   Moderate to severe pulmonary hypertension   Acute anemia, normal MCV   Mild thrombocytopenia, previous HIT panel was negative   Consults: CCM, physical therapy, case management  Procedures: Right Heart Cath, Left Heart Cath, Selective Coronary Angiography, SVG/LIMA angiography, LV angiography, transesophageal echocardiogram, BiPAP   Hospital Course: Margaret [name] is a 80 y.o. female with a history of CAD. She has a history of bypass surgery as well as chronic diastolic CHF and claudication. She came to the clinic for a Lexi scan Myoview. She became incontinent of bowel and had shortness of breath. She was also anxious. Her oxygen saturation was approximately 74%. She was placed on a face mask. She was unable to lie flat for her stress images. 911 was called and she was brought to the emergency room in acute respiratory distress. She was placed on BiPAP and given IV Lasix. She was also started on IV nitroglycerin. There was concern for a hypersensitivity reaction to the Myoview so she was given Solu-Medrol, Benadryl and Pepcid. She was also given Ativan 0.5 mg per anxiety. An ABG was performed, results  are below. She was significantly acidotic and a CCM consult was called.  Her respiratory status was managed by CCM. She improved on BiPAP and with diuresis. Her blood gases improved steadily, results below. As her respiratory status improved, the BiPAP was discontinued. She did not require intubation.  On 09/26, she was able to come off the BiPAP. She continued to improve and on 09/27, she was taken off IV Lasix. It was felt she had stabilized enough to discuss cardiac catheterization.  She was generally improving, but on 09/29, her hemoglobin decreased to 8.1. Her hemoglobin had been 11 on admission. She was evaluated by Dr. Katrinka Blazing who felt cardiac catheterization should be deferred until this was further evaluated. Fecal occult blood testing was negative. Her hemoglobin was low but remained stable. Her hematocrit was correspondingly low. Her MCV was within normal limits. Platelets decreased slightly to 123, but she had no bleeding issues.  She was stable for cardiac catheterization on 09/30. Catheterization results are below. She was felt to have severe ischemic mitral regurgitation after occlusion of the SVG graft to OM. She required IV Lasix at the end of the case for shortness of breath. Transesophageal echocardiogram was recommended to further evaluate the mitral valve. This was performed on 10/01.  Results are below. There was mild to moderate MR but no evidence of ruptured chordae. She also had moderate TR and at least moderate pulmonary hypertension. There is no thrombus. She had no evidence of vegetation or regurgitation on the aortic valve. Plaque was seen in the descending thoracic aorta. Her EF was 50-55%. Medical therapy was recommended.  On  10/02, she was seen by Dr. Katrinka Blazing and all data were reviewed. Because of her prolonged hospitalization, a physical therapy consult was obtained. She was felt to benefit from home health PT and in a as well as 24-hour supervision for safety after  discharge. No equipment was needed. Dr. Katrinka Blazing adjusted her medications after reviewing all data. She already has followup appointments with Dr. Kirke Corin and Dr. Abe People, and is encouraged to keep these. No further inpatient workup is indicated and she is considered stable for discharge, to follow up as an outpatient.  Labs: pH, Arterial  Date Value Ref Range Status  07/22/2014 7.372  7.350 - 7.450 Final  07/17/2014 7.338* 7.350 - 7.450 Final  07/17/2014 7.274* 7.350 - 7.450 Final  07/17/2014 7.189* 7.350 - 7.450 Final     pCO2 arterial  Date Value Ref Range Status  07/22/2014 41.4  35.0 - 45.0 mmHg Final  07/17/2014 44.9  35.0 - 45.0 mmHg Final  07/17/2014 52.3* 35.0 - 45.0 mmHg Final  07/17/2014 61.8* 35.0 - 45.0 mmHg Final     pO2, Arterial  Date Value Ref Range Status  07/22/2014 65.0* 80.0 - 100.0 mmHg Final  07/17/2014 109.0* 80.0 - 100.0 mmHg Final  07/17/2014 165.0* 80.0 - 100.0 mmHg Final  07/17/2014 87.0  80.0 - 100.0 mmHg Final     Bicarbonate  Date Value Ref Range Status  07/22/2014 24.4* 20.0 - 24.0 mEq/L Final  07/22/2014 24.1* 20.0 - 24.0 mEq/L Final  07/17/2014 24.2* 20.0 - 24.0 mEq/L Final  07/17/2014 24.3* 20.0 - 24.0 mEq/L Final    TCO2  Date Value Ref Range Status  07/22/2014 26  0 - 100 mmol/L Final  07/22/2014 25  0 - 100 mmol/L Final  07/17/2014 26  0 - 100 mmol/L Final  07/17/2014 26  0 - 100 mmol/L Final   Acid-base deficit  Date Value Ref Range Status  07/22/2014 1.0  0.0 - 2.0 mmol/L Final  07/22/2014 1.0  0.0 - 2.0 mmol/L Final  07/17/2014 2.0  0.0 - 2.0 mmol/L Final  07/17/2014 3.0* 0.0 - 2.0 mmol/L Final   Last ABG    Component Value Date/Time   PHART 7.372 07/22/2014 1423   PCO2ART 41.4 07/22/2014 1423   PO2ART 65.0* 07/22/2014 1423   HCO3 24.4* 07/22/2014 1424   TCO2 26 07/22/2014 1424   ACIDBASEDEF 1.0 07/22/2014 1424   O2SAT 61.0 07/22/2014 1424   Lab Results  Component Value Date   WBC 6.4 07/24/2014   HGB 8.7* 07/24/2014   HCT 27.2* 07/24/2014   MCV 95.8  07/24/2014   PLT 164 07/24/2014    Recent Labs Lab 07/18/14 0109  07/24/14 1005  NA 138  < > 137  K 4.2  < > 5.0  CL 100  < > 97  CO2 23  < > 29  BUN 28*  < > 29*  CREATININE 1.21*  < > 1.46*  CALCIUM 9.1  < > 9.2  PROT 7.3  --   --   BILITOT 0.4  --   --   ALKPHOS 54  --   --   ALT 9  --   --   AST 17  --   --   GLUCOSE 157*  < > 268*  < > = values in this interval not displayed. Troponin I  Date Value Ref Range Status  07/18/2014 <0.30  <0.30 ng/mL Final  07/18/2014 <0.30  <0.30 ng/mL Final  07/17/2014 <0.30  <0.30 ng/mL Final  07/17/2014 <0.30  <0.30  ng/mL Final   Pro B Natriuretic peptide (BNP)  Date/Time Value Ref Range Status  07/23/2014 12:03 PM 6332.0* 0 - 450 pg/mL Final  07/17/2014  6:35 PM 3446.0* 0 - 450 pg/mL Final    Recent Labs  07/22/14 0546  INR 1.04    Radiology: Dg Chest Port 1 View 07/18/2014   CLINICAL DATA:  Pulmonary edema.  EXAM: PORTABLE CHEST - 1 VIEW  COMPARISON:  07/17/2014  FINDINGS: Lungs are adequately inflated with significant interval improvement in the previously in diffuse bilateral airspace process likely improving interstitial edema. There is persistent more focal airspace opacification and lung bases which may be due to persistent asymmetric edema although cannot exclude infection. Cardiomediastinal silhouette and remainder of the exam is unchanged.  IMPRESSION: Significant interval improvement likely improved interstitial edema. More focal airspace opacification over the lung bases persists which may be due to asymmetric edema versus infection.   Electronically Signed   By: Elberta Fortis M.D.   On: 07/18/2014 07:44   Dg Chest Portable 1 View 07/17/2014   CLINICAL DATA:  Shortness of breath following trauma  EXAM: PORTABLE CHEST - 1 VIEW  COMPARISON:  04/19/2010  FINDINGS: Cardiac shadow is prominent. Postsurgical changes are again seen. Diffuse vascular congestion and bilateral fluffy infiltrates are noted consistent with CHF and pulmonary  edema. No definitive bony abnormality is seen.  IMPRESSION: Changes consistent with CHF pulmonary edema.   Electronically Signed   By: Alcide Clever M.D.   On: 07/17/2014 13:05    Cardiac Cath: 07/22/2014 Hemodynamics  RA 21 mmHg  RV 66/12 mmHg  PA 64/31 mmHg  PCWP 32 mmHg. giant V wave noted  LV 144/23 mmHg . LVEDP:  AO 130/62 mmHg  Oxygen saturations:  PA 61%  AO 92%  Cardiac Output (Fick) 6.12  Cardiac Index (Fick) 3.8  Aortic Valve: Peak to Peak gradient: 10 mmHg  Pulmonary vascular resistance (PVR): 2.2 Woods units.  Coronary angiography:  Coronary dominance: Right  Left Main: Heavily calcified with 50% ostial stenosis.  Left Anterior Descending (LAD): Occluded proximally. A stent is noted.  Circumflex (LCx): Medium in size and nondominant. The vessel is subtotally occluded in the midsegment supplying a relatively small OM 2 and on 3. OM1 is normal in size with minor irregularities.  Right Coronary Artery: Normal in size and dominant. The vessel is mildly calcified with 60% mid stenosis. The rest of the vessel has minor irregularities.  Posterior descending artery: Normal in size with no significant disease.  Posterior AV segment: Normal in size with minor irregularities.  Posterolateral branchs: Minor irregularities.  SVG to OM: Occluded at the ostium  SVG to diagonal: Patent with 20% ostial stenosis.  LIMA to LAD: Patent with no significant disease.  Left ventriculography: Left ventricular systolic function is mildly reduced , LVEF is estimated at 40-45% %, there is severe mitral regurgitation With dilated left atrium.  Final Conclusions:  1. Severe underlying three-vessel coronary artery disease with patent SVG to OM 2 and on 3 . Moderate mid RCA stenosis which was not grafted. Patent LIMA to LAD, and SVG to diagonal.  2. mildly reduced LV systolic function with evidence of severe mitral regurgitation.  3. Severely elevated filling pressures with giant V waves noted on  wedge pressure tracing.  Recommendations:  The patient likely has severe ischemic mitral regurgitation after occlusion of the SVG graft to OM. Filling pressures are severely elevated. She became more dyspneic at the end of the case and was given one dose  of IV Lasix 40 mg. I will continue with 20 mg IV twice daily. I initiated small dose lisinopril. I recommend a transesophageal echocardiogram to evaluate the mitral valve.   EKG: 07/18/2014 Sinus rhythm, no acute ischemic changes Vent. rate 91 BPM PR interval 156 ms QRS duration 66 ms QT/QTc 418/514 ms P-R-T axes 65 21 158  Echo: 07/23/2014 Conclusions - Left ventricle: Pseudonormal pattern of diastolic dysfunction with elevated filling pressures. The cavity size was normal. Wall thickness was normal. Systolic function was normal. The estimated ejection fraction was in the range of 50% to 55%. No obvious regional wall motion abnormalities. - Aortic valve: No evidence of vegetation. There was no regurgitation. - Aorta: Moderate non-mobile plaque seen in the descending thoracic aorta. - Mitral valve: There was mild to moderate regurgitation. - Left atrium: No evidence of thrombus in the atrial cavity or appendage. - Right ventricle: Systolic function was normal. - Right atrium: No evidence of thrombus in the atrial cavity or appendage. - Tricuspid valve: There was moderate regurgitation. - Pulmonary arteries: Systolic pressure was moderately increased. PA peak pressure: 52 mm Hg (S). Impressions: - Low normal LV systolic function with grade 2 diastolic dysfunction and significantly elevated filling pressures. Mild to moderate mitral regurgitation with no evidence of ruptured chordea. Moderate tricuspid regurgitation. At least moderate pulmonary hypertension (TR gradient 49 mmHg + RAP).  BP 99/48  Pulse 75  Temp(Src) 98.3 F (36.8 C) (Oral)  Resp 18  Ht 5' (1.524 m)  Wt 140 lb 3.4 oz (63.6 kg)  BMI 27.38 kg/m2  SpO2  97% General: Well developed, well nourished, female in no acute distress Head: Eyes PERRLA, No xanthomas.   Normocephalic and atraumatic  Lungs: Clear bilaterally to auscultation. Heart: HRRR S1 S2, without MRG.  Pulses are 2+ & equal.   No JVD.   Abdomen: Bowel sounds are present, abdomen soft and non-tender without masses or  hernias noted. Msk: Generalized weakness, no focal deficits. Extremities: No clubbing, cyanosis or edema.    Skin:  No rashes or lesions noted. Neuro: Alert and oriented X 3. Psych:  Good affect, responds appropriately   FOLLOW UP PLANS AND APPOINTMENTS Allergies  Allergen Reactions  . Myoview [Technetium-6260m] Shortness Of Breath  . Penicillins Other (See Comments)    Unknown allergic reaction      Medication List    STOP taking these medications       PRESCRIPTION MEDICATION  Replaced by:  levothyroxine 125 MCG tablet      TAKE these medications       carvedilol 12.5 MG tablet  Commonly known as:  COREG  Take 1 tablet (12.5 mg total) by mouth 2 (two) times daily with a meal. Carca from [place] (after breakfast and after dinner)     furosemide 40 MG tablet  Commonly known as:  LASIX  Take 1 tablet (40 mg total) by mouth daily after breakfast. Lasix from [place]     glipiZIDE-metformin 5-500 MG per tablet  Commonly known as:  METAGLIP  Take 1 tablet by mouth daily with lunch.     ibuprofen 200 MG tablet  Commonly known as:  ADVIL,MOTRIN  Take 200 mg by mouth daily as needed (pain).     levothyroxine 125 MCG tablet  Commonly known as:  SYNTHROID, LEVOTHROID  Take 1 tablet (125 mcg total) by mouth daily before breakfast.     lisinopril 5 MG tablet  Commonly known as:  PRINIVIL,ZESTRIL  Take 1 tablet (5 mg total) by mouth daily.  PRESCRIPTION MEDICATION  Take 1 tablet by mouth daily after lunch. Clavix-As 75 (clopidogrel 75 mg & aspirin 75 mg) from [place]     rosuvastatin 10 MG tablet  Commonly known as:  CRESTOR  Take 1 tablet (10 mg  total) by mouth daily after supper. Rosavel from [place]     sitaGLIPtin-metformin 50-500 MG per tablet  Commonly known as:  JANUMET  Take 1 tablet by mouth 2 (two) times daily with a meal. Before breakfast and before dinner     Vitamin B-12 5000 MCG Subl  Place 5,000 mcg under the tongue daily.        Discharge Instructions   Diet - low sodium heart healthy    Complete by:  As directed      Diet Carb Modified    Complete by:  As directed      Increase activity slowly    Complete by:  As directed           Follow-up Information   Follow up with Lorine Bears, MD On 08/04/2014. (At 10:15)    Specialty:  Cardiology   Contact information:   [address] [place] [address] [phone]       Follow up with Corky Crafts., MD On 08/06/2014. (At 10:00)    Specialty:  Interventional Cardiology   Contact information:   [address] [place] [address] [phone]       BRING ALL MEDICATIONS WITH YOU TO FOLLOW UP APPOINTMENTS  Time spent with patient to include physician time: 49 min Signed: Theodore Demark, PA-C 07/24/2014, 12:34 PM Co-Sign MD

## 2014-07-24 NOTE — Evaluation (Signed)
Physical Therapy Evaluation Patient Details Name: Margaret [name] MRN: [id number] DOB: [birth year] Today's Date: 07/24/2014   History of Present Illness  Patient is a 80 y/o female admitted with acute pulmonary hypertension and CHF.  S/p TEE and heart cath.  Clinical Impression  Patient presents with weakness due to hospitalization and procedures.  Feel will benefit from HHPT and HHaide for bathing as well as initial 24 hour supervision for safety upon D/C.    Follow Up Recommendations Home health PT;Supervision/Assistance - 24 hour    Equipment Recommendations  None recommended by PT    Recommendations for Other Services       Precautions / Restrictions Precautions Precautions: Fall Precaution Comments: has fallen twice in 6 months out in community when rushing to bathroom per daughter,  no falls at home      Mobility  Bed Mobility               General bed mobility comments: NT pt in chair  Transfers Overall transfer level: Needs assistance Equipment used: Rolling walker (2 wheeled) Transfers: Sit to/from Stand Sit to Stand: Min guard         General transfer comment: increased time and effor to stand (esp with multiple lines)  Ambulation/Gait Ambulation/Gait assistance: Supervision Ambulation Distance (Feet): 200 Feet Assistive device: Rolling walker (2 wheeled) Gait Pattern/deviations: Step-through pattern;Decreased stride length     General Gait Details: good steady speed with no noted loss of balance on turns or with head turns  Science writer    Modified Rankin (Stroke Patients Only)       Balance Overall balance assessment: History of Falls;Needs assistance   Sitting balance-Leahy Scale: Good       Standing balance-Leahy Scale: Poor Standing balance comment: UE assist for balance                             Pertinent Vitals/Pain Pain Assessment: No/denies pain    Home Living Family/patient expects  to be discharged to:: Private residence Living Arrangements: Children;Spouse/significant other Available Help at Discharge: Family;Available 24 hours/day (initially) Type of Home: House Home Access: Level entry     Home Layout: Two level;Able to live on main level with bedroom/bathroom Home Equipment: Gilford Rile - 2 wheels;Shower seat;Grab bars - tub/shower      Prior Function Level of Independence: Needs assistance   Gait / Transfers Assistance Needed: independent with walker  ADL's / Homemaking Assistance Needed: assist for shower        Hand Dominance   Dominant Hand: Right    Extremity/Trunk Assessment   Upper Extremity Assessment: RUE deficits/detail;LUE deficits/detail RUE Deficits / Details: AROM WFL, strength grossly 4/5     LUE Deficits / Details: AROM WFL, strength grossly 4/5   Lower Extremity Assessment: RLE deficits/detail;LLE deficits/detail RLE Deficits / Details: AROM WFL, strength grossly 4/5 LLE Deficits / Details: AROM WFL, strength grossly 4/5  Cervical / Trunk Assessment: Kyphotic  Communication   Communication: Prefers language other than English;Interpreter utilized (daughter)  Cognition Arousal/Alertness: Awake/alert Behavior During Therapy: WFL for tasks assessed/performed Overall Cognitive Status: Within Functional Limits for tasks assessed                      General Comments      Exercises        Assessment/Plan    PT Assessment All further PT needs can  be met in the next venue of care  PT Diagnosis Generalized weakness   PT Problem List Decreased strength;Decreased activity tolerance;Decreased balance  PT Treatment Interventions     PT Goals (Current goals can be found in the Care Plan section) Acute Rehab PT Goals PT Goal Formulation: No goals set, d/c therapy    Frequency     Barriers to discharge        Co-evaluation               End of Session Equipment Utilized During Treatment: Gait belt Activity  Tolerance: Patient tolerated treatment well Patient left: in chair;with call bell/phone within reach;with family/visitor present           Time: 0850-0820 PT Time Calculation (min): 1410 min   Charges:   PT Evaluation $Initial PT Evaluation Tier I: 1 Procedure PT Treatments $Gait Training: 8-22 mins   PT G Codes:          WYNN,Margaret [name] 07/28/14, 9:32 AM Magda Kiel, [place] 07-28-14

## 2014-07-29 ENCOUNTER — Telehealth: Payer: Self-pay | Admitting: Cardiovascular Disease

## 2014-07-29 ENCOUNTER — Telehealth: Payer: Self-pay | Admitting: Interventional Cardiology

## 2014-07-29 NOTE — Telephone Encounter (Signed)
New message      Pt had a cath recently.  Daughter has a few questions to ask Dr Kirke Corin

## 2014-07-29 NOTE — Telephone Encounter (Signed)
I spoke with the pt's daughter and she wanted to clarify what type of tests the pt will be having on the 15th.  I made her aware that these are vascular ultrasounds.  They do not involve any contrast or medications during this test.

## 2014-07-29 NOTE — Telephone Encounter (Signed)
New message     FYI Family only want physical therapy and not nursing and home health aide.

## 2014-07-30 ENCOUNTER — Encounter (HOSPITAL_COMMUNITY): Payer: Self-pay | Admitting: Emergency Medicine

## 2014-07-30 ENCOUNTER — Inpatient Hospital Stay (HOSPITAL_COMMUNITY)
Admission: EM | Admit: 2014-07-30 | Discharge: 2014-08-02 | DRG: 682 | Disposition: A | Discharge status: Home / Self Care | Payer: Managed Care, Other (non HMO) | Attending: Internal Medicine | Admitting: Internal Medicine

## 2014-07-30 DIAGNOSIS — E875 Hyperkalemia: Secondary | ICD-10-CM | POA: Diagnosis present

## 2014-07-30 DIAGNOSIS — Z7901 Long term (current) use of anticoagulants: Secondary | ICD-10-CM

## 2014-07-30 DIAGNOSIS — Z951 Presence of aortocoronary bypass graft: Secondary | ICD-10-CM

## 2014-07-30 DIAGNOSIS — E119 Type 2 diabetes mellitus without complications: Secondary | ICD-10-CM

## 2014-07-30 DIAGNOSIS — Z955 Presence of coronary angioplasty implant and graft: Secondary | ICD-10-CM

## 2014-07-30 DIAGNOSIS — Z794 Long term (current) use of insulin: Secondary | ICD-10-CM

## 2014-07-30 DIAGNOSIS — J9601 Acute respiratory failure with hypoxia: Secondary | ICD-10-CM

## 2014-07-30 DIAGNOSIS — I340 Nonrheumatic mitral (valve) insufficiency: Secondary | ICD-10-CM

## 2014-07-30 DIAGNOSIS — I5033 Acute on chronic diastolic (congestive) heart failure: Secondary | ICD-10-CM | POA: Diagnosis present

## 2014-07-30 DIAGNOSIS — N183 Chronic kidney disease, stage 3 (moderate): Secondary | ICD-10-CM | POA: Diagnosis present

## 2014-07-30 DIAGNOSIS — I739 Peripheral vascular disease, unspecified: Secondary | ICD-10-CM | POA: Diagnosis present

## 2014-07-30 DIAGNOSIS — I501 Left ventricular failure: Secondary | ICD-10-CM

## 2014-07-30 DIAGNOSIS — Z792 Long term (current) use of antibiotics: Secondary | ICD-10-CM

## 2014-07-30 DIAGNOSIS — N179 Acute kidney failure, unspecified: Secondary | ICD-10-CM | POA: Diagnosis not present

## 2014-07-30 DIAGNOSIS — E538 Deficiency of other specified B group vitamins: Secondary | ICD-10-CM | POA: Diagnosis present

## 2014-07-30 DIAGNOSIS — I129 Hypertensive chronic kidney disease with stage 1 through stage 4 chronic kidney disease, or unspecified chronic kidney disease: Secondary | ICD-10-CM | POA: Diagnosis present

## 2014-07-30 DIAGNOSIS — N189 Chronic kidney disease, unspecified: Secondary | ICD-10-CM | POA: Diagnosis present

## 2014-07-30 DIAGNOSIS — I272 Pulmonary hypertension, unspecified: Secondary | ICD-10-CM

## 2014-07-30 DIAGNOSIS — N289 Disorder of kidney and ureter, unspecified: Secondary | ICD-10-CM

## 2014-07-30 DIAGNOSIS — E039 Hypothyroidism, unspecified: Secondary | ICD-10-CM | POA: Diagnosis present

## 2014-07-30 DIAGNOSIS — I509 Heart failure, unspecified: Secondary | ICD-10-CM

## 2014-07-30 DIAGNOSIS — I2510 Atherosclerotic heart disease of native coronary artery without angina pectoris: Secondary | ICD-10-CM | POA: Diagnosis present

## 2014-07-30 DIAGNOSIS — J9602 Acute respiratory failure with hypercapnia: Secondary | ICD-10-CM

## 2014-07-30 DIAGNOSIS — E785 Hyperlipidemia, unspecified: Secondary | ICD-10-CM

## 2014-07-30 DIAGNOSIS — E079 Disorder of thyroid, unspecified: Secondary | ICD-10-CM

## 2014-07-30 DIAGNOSIS — I5032 Chronic diastolic (congestive) heart failure: Secondary | ICD-10-CM

## 2014-07-30 DIAGNOSIS — I70219 Atherosclerosis of native arteries of extremities with intermittent claudication, unspecified extremity: Secondary | ICD-10-CM

## 2014-07-30 DIAGNOSIS — I959 Hypotension, unspecified: Secondary | ICD-10-CM | POA: Diagnosis present

## 2014-07-30 DIAGNOSIS — Z880 Allergy status to penicillin: Secondary | ICD-10-CM

## 2014-07-30 LAB — CBC WITH DIFFERENTIAL/PLATELET
Basophils Absolute: 0 10*3/uL (ref 0.0–0.1)
Basophils Relative: 0 % (ref 0–1)
EOS PCT: 3 % (ref 0–5)
Eosinophils Absolute: 0.2 10*3/uL (ref 0.0–0.7)
HCT: 25.5 % — ABNORMAL LOW (ref 36.0–46.0)
Hemoglobin: 8.4 g/dL — ABNORMAL LOW (ref 12.0–15.0)
LYMPHS ABS: 1.2 10*3/uL (ref 0.7–4.0)
LYMPHS PCT: 17 % (ref 12–46)
MCH: 30.2 pg (ref 26.0–34.0)
MCHC: 32.9 g/dL (ref 30.0–36.0)
MCV: 91.7 fL (ref 78.0–100.0)
Monocytes Absolute: 0.5 10*3/uL (ref 0.1–1.0)
Monocytes Relative: 7 % (ref 3–12)
Neutro Abs: 5 10*3/uL (ref 1.7–7.7)
Neutrophils Relative %: 73 % (ref 43–77)
Platelets: 191 10*3/uL (ref 150–400)
RBC: 2.78 MIL/uL — AB (ref 3.87–5.11)
RDW: 13.2 % (ref 11.5–15.5)
WBC: 6.9 10*3/uL (ref 4.0–10.5)

## 2014-07-30 LAB — COMPREHENSIVE METABOLIC PANEL
ALT: 14 U/L (ref 0–35)
AST: 15 U/L (ref 0–37)
Albumin: 3.6 g/dL (ref 3.5–5.2)
Alkaline Phosphatase: 61 U/L (ref 39–117)
Anion gap: 16 — ABNORMAL HIGH (ref 5–15)
BUN: 51 mg/dL — ABNORMAL HIGH (ref 6–23)
CALCIUM: 9.2 mg/dL (ref 8.4–10.5)
CO2: 21 mEq/L (ref 19–32)
Chloride: 100 mEq/L (ref 96–112)
Creatinine, Ser: 3.38 mg/dL — ABNORMAL HIGH (ref 0.50–1.10)
GFR calc non Af Amer: 12 mL/min — ABNORMAL LOW (ref >90)
GFR, EST AFRICAN AMERICAN: 14 mL/min — AB (ref >90)
Glucose, Bld: 154 mg/dL — ABNORMAL HIGH (ref 70–99)
Potassium: 5.5 mEq/L — ABNORMAL HIGH (ref 3.7–5.3)
Sodium: 137 mEq/L (ref 137–147)
Total Bilirubin: 0.4 mg/dL (ref 0.3–1.2)
Total Protein: 7.3 g/dL (ref 6.0–8.3)

## 2014-07-30 NOTE — Telephone Encounter (Signed)
OK with me.

## 2014-07-30 NOTE — Telephone Encounter (Signed)
lmtrc

## 2014-07-30 NOTE — ED Notes (Signed)
Pt. received a call from her PCP advised her to go to ER due to elevated Creatinine level - test done today at [facility] . Denies pain or discomfort , mild exertional dyspnea , no cough or congestion .

## 2014-07-30 NOTE — Telephone Encounter (Signed)
To Dr. Varanasi. 

## 2014-07-31 ENCOUNTER — Emergency Department (HOSPITAL_COMMUNITY): Payer: Managed Care, Other (non HMO)

## 2014-07-31 ENCOUNTER — Encounter (HOSPITAL_COMMUNITY): Payer: Managed Care, Other (non HMO)

## 2014-07-31 ENCOUNTER — Inpatient Hospital Stay (HOSPITAL_COMMUNITY): Payer: Managed Care, Other (non HMO)

## 2014-07-31 ENCOUNTER — Encounter (HOSPITAL_COMMUNITY): Payer: Self-pay | Admitting: Physician Assistant

## 2014-07-31 DIAGNOSIS — N183 Chronic kidney disease, stage 3 (moderate): Secondary | ICD-10-CM | POA: Diagnosis present

## 2014-07-31 DIAGNOSIS — E785 Hyperlipidemia, unspecified: Secondary | ICD-10-CM | POA: Diagnosis present

## 2014-07-31 DIAGNOSIS — Z951 Presence of aortocoronary bypass graft: Secondary | ICD-10-CM | POA: Diagnosis not present

## 2014-07-31 DIAGNOSIS — Z880 Allergy status to penicillin: Secondary | ICD-10-CM | POA: Diagnosis not present

## 2014-07-31 DIAGNOSIS — N179 Acute kidney failure, unspecified: Secondary | ICD-10-CM | POA: Diagnosis not present

## 2014-07-31 DIAGNOSIS — N189 Chronic kidney disease, unspecified: Secondary | ICD-10-CM

## 2014-07-31 DIAGNOSIS — I501 Left ventricular failure: Secondary | ICD-10-CM | POA: Diagnosis not present

## 2014-07-31 DIAGNOSIS — E538 Deficiency of other specified B group vitamins: Secondary | ICD-10-CM | POA: Diagnosis present

## 2014-07-31 DIAGNOSIS — E875 Hyperkalemia: Secondary | ICD-10-CM | POA: Diagnosis present

## 2014-07-31 DIAGNOSIS — Z7901 Long term (current) use of anticoagulants: Secondary | ICD-10-CM | POA: Diagnosis not present

## 2014-07-31 DIAGNOSIS — I739 Peripheral vascular disease, unspecified: Secondary | ICD-10-CM | POA: Diagnosis present

## 2014-07-31 DIAGNOSIS — E039 Hypothyroidism, unspecified: Secondary | ICD-10-CM | POA: Diagnosis present

## 2014-07-31 DIAGNOSIS — I2510 Atherosclerotic heart disease of native coronary artery without angina pectoris: Secondary | ICD-10-CM | POA: Diagnosis present

## 2014-07-31 DIAGNOSIS — I959 Hypotension, unspecified: Secondary | ICD-10-CM | POA: Diagnosis present

## 2014-07-31 DIAGNOSIS — I340 Nonrheumatic mitral (valve) insufficiency: Secondary | ICD-10-CM | POA: Diagnosis present

## 2014-07-31 DIAGNOSIS — I5033 Acute on chronic diastolic (congestive) heart failure: Secondary | ICD-10-CM | POA: Diagnosis present

## 2014-07-31 DIAGNOSIS — I129 Hypertensive chronic kidney disease with stage 1 through stage 4 chronic kidney disease, or unspecified chronic kidney disease: Secondary | ICD-10-CM | POA: Diagnosis present

## 2014-07-31 DIAGNOSIS — Z955 Presence of coronary angioplasty implant and graft: Secondary | ICD-10-CM | POA: Diagnosis not present

## 2014-07-31 DIAGNOSIS — Z792 Long term (current) use of antibiotics: Secondary | ICD-10-CM | POA: Diagnosis not present

## 2014-07-31 DIAGNOSIS — Z794 Long term (current) use of insulin: Secondary | ICD-10-CM | POA: Diagnosis not present

## 2014-07-31 DIAGNOSIS — E119 Type 2 diabetes mellitus without complications: Secondary | ICD-10-CM | POA: Diagnosis present

## 2014-07-31 HISTORY — DX: Chronic kidney disease, unspecified: N17.9

## 2014-07-31 LAB — URINALYSIS, ROUTINE W REFLEX MICROSCOPIC
Bilirubin Urine: NEGATIVE
Glucose, UA: NEGATIVE mg/dL
Ketones, ur: NEGATIVE mg/dL
Nitrite: NEGATIVE
PROTEIN: NEGATIVE mg/dL
Specific Gravity, Urine: 1.007 (ref 1.005–1.030)
UROBILINOGEN UA: 0.2 mg/dL (ref 0.0–1.0)
pH: 7 (ref 5.0–8.0)

## 2014-07-31 LAB — BASIC METABOLIC PANEL
Anion gap: 17 — ABNORMAL HIGH (ref 5–15)
BUN: 49 mg/dL — ABNORMAL HIGH (ref 6–23)
CALCIUM: 9.1 mg/dL (ref 8.4–10.5)
CO2: 19 mEq/L (ref 19–32)
Chloride: 99 mEq/L (ref 96–112)
Creatinine, Ser: 3.01 mg/dL — ABNORMAL HIGH (ref 0.50–1.10)
GFR calc Af Amer: 16 mL/min — ABNORMAL LOW (ref >90)
GFR, EST NON AFRICAN AMERICAN: 14 mL/min — AB (ref >90)
GLUCOSE: 160 mg/dL — AB (ref 70–99)
POTASSIUM: 5.5 meq/L — AB (ref 3.7–5.3)
SODIUM: 135 meq/L — AB (ref 137–147)

## 2014-07-31 LAB — GLUCOSE, CAPILLARY
Glucose-Capillary: 119 mg/dL — ABNORMAL HIGH (ref 70–99)
Glucose-Capillary: 187 mg/dL — ABNORMAL HIGH (ref 70–99)
Glucose-Capillary: 109 mg/dL — ABNORMAL HIGH (ref 70–99)

## 2014-07-31 LAB — HEMOGLOBIN A1C
HEMOGLOBIN A1C: 6.1 % — AB (ref <5.7)
Mean Plasma Glucose: 128 mg/dL — ABNORMAL HIGH (ref <117)

## 2014-07-31 LAB — URINE MICROSCOPIC-ADD ON

## 2014-07-31 LAB — TROPONIN I: Troponin I: <0.3 ng/mL (ref <0.30)

## 2014-07-31 LAB — MAGNESIUM: MAGNESIUM: 2 mg/dL (ref 1.5–2.5)

## 2014-07-31 LAB — CBG MONITORING, ED
GLUCOSE-CAPILLARY: 144 mg/dL — AB (ref 70–99)
GLUCOSE-CAPILLARY: 154 mg/dL — AB (ref 70–99)

## 2014-07-31 LAB — PRO B NATRIURETIC PEPTIDE: PRO B NATRI PEPTIDE: 7147 pg/mL — AB (ref 0–450)

## 2014-07-31 MED ORDER — FUROSEMIDE 10 MG/ML IJ SOLN
20.0000 mg | Freq: Once | INTRAMUSCULAR | Status: AC
  Administered 2014-07-31: 20 mg via INTRAVENOUS
  Filled 2014-07-31: qty 2

## 2014-07-31 MED ORDER — SODIUM CHLORIDE 0.9 % IJ SOLN
3.0000 mL | Freq: Two times a day (BID) | INTRAMUSCULAR | Status: DC
  Administered 2014-07-31 – 2014-08-01 (×4): 3 mL via INTRAVENOUS

## 2014-07-31 MED ORDER — HEPARIN SODIUM (PORCINE) 5000 UNIT/ML IJ SOLN
5000.0000 [IU] | Freq: Three times a day (TID) | INTRAMUSCULAR | Status: DC
  Administered 2014-07-31 – 2014-08-01 (×3): 5000 [IU] via SUBCUTANEOUS
  Filled 2014-07-31 (×7): qty 1

## 2014-07-31 MED ORDER — INSULIN ASPART 100 UNIT/ML ~~LOC~~ SOLN
0.0000 [IU] | Freq: Three times a day (TID) | SUBCUTANEOUS | Status: DC
  Administered 2014-07-31 – 2014-08-01 (×4): 2 [IU] via SUBCUTANEOUS
  Filled 2014-07-31: qty 1

## 2014-07-31 MED ORDER — HEPARIN SODIUM (PORCINE) 5000 UNIT/ML IJ SOLN: 5000.0000 [IU] | Freq: Three times a day (TID) | INTRAMUSCULAR | Status: DC

## 2014-07-31 MED ORDER — VITAMIN B-12 1000 MCG PO TABS
5000.0000 ug | ORAL_TABLET | Freq: Every day | ORAL | Status: DC
  Administered 2014-07-31 – 2014-08-02 (×3): 5000 ug via ORAL
  Filled 2014-07-31 (×3): qty 5

## 2014-07-31 MED ORDER — FUROSEMIDE 10 MG/ML IJ SOLN
40.0000 mg | Freq: Two times a day (BID) | INTRAMUSCULAR | Status: DC
  Filled 2014-07-31: qty 4

## 2014-07-31 MED ORDER — CARVEDILOL 12.5 MG PO TABS
12.5000 mg | ORAL_TABLET | Freq: Two times a day (BID) | ORAL | Status: DC
  Administered 2014-07-31 (×2): 12.5 mg via ORAL
  Filled 2014-07-31 (×4): qty 1

## 2014-07-31 MED ORDER — CLOPIDOGREL BISULFATE 75 MG PO TABS
75.0000 mg | ORAL_TABLET | Freq: Every day | ORAL | Status: DC
  Administered 2014-07-31 – 2014-08-02 (×3): 75 mg via ORAL
  Filled 2014-07-31 (×3): qty 1

## 2014-07-31 MED ORDER — LEVOTHYROXINE SODIUM 125 MCG PO TABS
125.0000 ug | ORAL_TABLET | Freq: Every day | ORAL | Status: DC
  Administered 2014-07-31 – 2014-08-02 (×3): 125 ug via ORAL
  Filled 2014-07-31 (×5): qty 1

## 2014-07-31 MED ORDER — CARVEDILOL 3.125 MG PO TABS
3.1250 mg | ORAL_TABLET | Freq: Two times a day (BID) | ORAL | Status: DC
  Administered 2014-08-01: 3.125 mg via ORAL
  Filled 2014-07-31 (×3): qty 1

## 2014-07-31 MED ORDER — VITAMIN B-12 5000 MCG SL SUBL: 5000.0000 ug | SUBLINGUAL_TABLET | Freq: Every day | SUBLINGUAL | Status: DC

## 2014-07-31 MED ORDER — CLOPIDOGREL BISULFATE 75 MG PO TABS: 75.0000 mg | ORAL_TABLET | Freq: Every day | ORAL | Status: DC

## 2014-07-31 MED ORDER — ROSUVASTATIN CALCIUM 10 MG PO TABS
10.0000 mg | ORAL_TABLET | Freq: Every day | ORAL | Status: DC
  Administered 2014-07-31 – 2014-08-01 (×2): 10 mg via ORAL
  Filled 2014-07-31 (×3): qty 1

## 2014-07-31 NOTE — Progress Notes (Signed)
[facility]  Patient Status: Active (receiving services up to time of hospitalization)  [facility] is providing the following services: RN, PT and OT  If patient discharges after hours, please call [phone].   [name] 07/31/2014, 1:12 PM

## 2014-07-31 NOTE — ED Notes (Addendum)
Per family, pt was sent to ED for further evaluation regarding elevated creatinine levels. Pt had cardiac stress test two weeks ago and "something went wrong with the contrast and she had to come to the ED" (states daughter). Pt went to PCP on 10/8 for follow up care post-discharge and blood work was completed. Pt denies any complaints. Denies difficulty with urination.

## 2014-07-31 NOTE — ED Notes (Signed)
Pt requesting food and something to drink; per Dr.Otter, pt to have water only.

## 2014-07-31 NOTE — Progress Notes (Signed)
Contacted by nursing staff regarding hypotension. Patient came in with acute on chronic renal insufficiency. Pending diuresis with IV lasix. Patient refused IV in fear of make renal function worse. I have discussed with patient's daughter, given her hypotension of 37s, I will instruct nursing staff to hold tonight's dose of lasix which can potentially drop BP further and make her renal function worse. I will also decrease her coreg from 12.5 to 3.125 mg to allow more BP room. AM team to assess if need IV lasix  Signed, Azalee Course PA Pager: [phone]

## 2014-07-31 NOTE — Consult Note (Signed)
CARDIOLOGY CONSULT NOTE   Patient ID: Margaret AversSudha [name] MRN: [id number] DOB/AGE: 78/06/1934 78 y.o.  Admit date: 07/30/2014  Primary Physician   Redmond BasemanWONG,Margaret PATRICK, MD  Primary Cardiologist   Dr. Eldridge DaceVaranasi Reason for Consultation   CHF  QIO:NGEXBHPI:Margaret [name] is a 80 y.o. female with a history of CAD, D-CHF, DM, HTN, CKD III, HLD, and hypothyroidism.   She was admitted 09/25 thru 10/02 after she held her Lasix and BB for a Myoview. She had a Lexiscan injection, developed diarrhea, acute CHF requiring BiPAP and was hypoxic.   She was diuresed and then a R/L heart cath was performed. She had ischemic MR due to occlusion of SVG-OM, medical therapy. TEE showed mod TR and mod pulm HTN, but no ruptured chordae.   By 10/02, she was medically stable, discharge weight was 140 lbs.     She went to her family MD for f/u visit and blood work was performed which showed significantly worsened renal function. She was taken off Lasix and metformin. When her renal function did not improve, she was instructed to come to the ER, and was admitted 10/09. CHF on CXR, cards consulted.   The patient speaks a little English and her son was on the phone for part of the interview to clarify. Since d/c, she had no chest pain or SOB at rest. She could do minor ambulation without difficulty. She was not weighing herself daily. She did not think she was gaining fluid. She had DOE when she tried to walk fast, but does not seem upset by this. She does not feel bad now and asks if she will go home today. There was no PND, orthopnea is not clear, but think negative.  Past Medical History  Diagnosis Date  . CAD S/P percutaneous coronary angioplasty 2005    prior LAD PCI - in 2005, LAD stent 100% occluded (mid LAD).  Marland Kitchen. Left main coronary artery disease 03/2010    Ostial left main 60, OM1 70, circumflex 50-60, mid LAD stent occluded, mid RCA 30-40, RV marginal 90, EF 45-50% --> CABG - (Bartle) LIMA-LAD, SVG-D1, sSVG-OM1-OM2,  .  Chronic diastolic CHF (congestive heart failure), NYHA class 2     Grade 2 Diastolic Dysfunction on Echo (hospitalized in [place] 01/2014 with CHF, no PTCA performed)  . Hx of echocardiogram 05/2014    EF 50-55%, inf-lat HK, Gr 2 DD, very mild AS (mean 10 mmHg), mild MR, PASP 36 mmHg; calcified density in LA (suggest TEE)  . Atherosclerotic peripheral vascular disease with intermittent claudication 04/2014    LEA Dopplers: RABI 1.02/ LABI: 0.61 (L SFA, Pop & DP blunted wave forms) -- correlates with L leg claudication  . Type 2 diabetes mellitus   . Hyperlipidemia with target LDL less than 70   . Vitamin B12 deficiency   . Thyroid disease   . RESPIRATORY ARREST assoc w MYOVIEW      Past Surgical History  Procedure Laterality Date  . Total knee arthroplasty    . Cardiac catheterization  2005    LAD stent occluded  . Cardiac catheterization  03/2010     Ostial left main 60, OM1 70, circumflex 50-60, mid LAD stent occluded, mid RCA 30-40, RV marginal 90, EF 45-50%. >>> CABG  . Coronary artery bypass graft  03/2010    LIMA-LAD, SVG-D1, seqSVG-OM1-OM3    Allergies  Allergen Reactions  . Myoview [Technetium-8248m] Shortness Of Breath  . Penicillins Other (See Comments)    Unknown allergic reaction  I have reviewed the patient's current medications . carvedilol  12.5 mg Oral BID WC  . clopidogrel  75 mg Oral Daily  . furosemide  40 mg Intravenous BID  . heparin  5,000 Units Subcutaneous 3 times per day  . insulin aspart  0-9 Units Subcutaneous TID WC  . levothyroxine  125 mcg Oral QAC breakfast  . rosuvastatin  10 mg Oral QPC supper  . sodium chloride  3 mL Intravenous Q12H  . vitamin B-12  5,000 mcg Oral Daily       Prior to Admission medications   Medication Sig Start Date End Date Taking? Authorizing Provider  carvedilol (COREG) 12.5 MG tablet Take 1 tablet (12.5 mg total) by mouth 2 (two) times daily with a meal. Carca from [place] (after breakfast and after dinner) 07/24/14  Yes  Rhonda G Barrett, PA-C  clopidogrel (PLAVIX) 75 MG tablet Take 75 mg by mouth daily.   Yes Historical Provider, MD  Cyanocobalamin (VITAMIN B-12) 5000 MCG SUBL Place 5,000 mcg under the tongue daily.   Yes Historical Provider, MD  ibuprofen (ADVIL,MOTRIN) 200 MG tablet Take 200 mg by mouth every 6 (six) hours as needed for moderate pain (pain).    Yes Historical Provider, MD  levothyroxine (SYNTHROID, LEVOTHROID) 125 MCG tablet Take 1 tablet (125 mcg total) by mouth daily before breakfast. 07/24/14  Yes Rhonda G Barrett, PA-C  lisinopril (PRINIVIL,ZESTRIL) 5 MG tablet Take 1 tablet (5 mg total) by mouth daily. 07/24/14  Yes Rhonda G Barrett, PA-C  rosuvastatin (CRESTOR) 10 MG tablet Take 1 tablet (10 mg total) by mouth daily after supper. Rosavel from [place] 07/24/14  Yes Darrol Jump, PA-C     History   Social History  . Marital Status: Single    Spouse Name: N/A    Number of Children: N/A  . Years of Education: N/A   Occupational History  . Retired    Social History Main Topics  . Smoking status: Never Smoker   . Smokeless tobacco: Not on file  . Alcohol Use: No  . Drug Use: No  . Sexual Activity: Not on file   Other Topics Concern  . Not on file   Social History Narrative   Son is POA.   Does not smoke - never smoked.   Spends 1/2 year in [place] & 1/2 year in [place].    Family Status  Relation Status Death Age  . Mother Deceased   . Father Deceased   . Sister Alive   . Brother Deceased   . Son Alive   . Daughter Alive    Family History  Problem Relation Age of Onset  . Ulcers    . Diabetes    . Arthritis    . Heart attack Neg Hx   . Diabetes    . Diabetes Father      ROS:  Full 14 point review of systems complete and found to be negative unless listed above.  Physical Exam: Blood pressure 126/68, pulse 87, temperature 98.6 F (37 C), temperature source Oral, resp. rate 16, height 4\' 10"  (1.473 m), weight 147 lb 7.8 oz (66.9 kg), SpO2 98.00%.  General: Well  developed, well nourished, female in no acute distress Head: Eyes PERRLA, No xanthomas.   Normocephalic and atraumatic, oropharynx without edema or exudate. Dentition: poor Lungs: decreased BS bases Heart: HRRR S1 S2, no rub/gallop, soft murmur at apex. pulses are 2+ all 4 extrem.   Neck: No carotid bruits. No lymphadenopathy.  JVD at  10 cm. Abdomen: Bowel sounds present, abdomen soft and non-tender without masses or hernias noted. Msk:  No spine or cva tenderness. No weakness, no joint deformities or effusions. Extremities: No clubbing or cyanosis. 1+ edema.  Neuro: Alert and oriented X 3. No focal deficits noted. Psych:  Good affect, responds appropriately Skin: No rashes or lesions noted.  Labs:   Lab Results  Component Value Date   WBC 6.9 07/30/2014   HGB 8.4* 07/30/2014   HCT 25.5* 07/30/2014   MCV 91.7 07/30/2014   PLT 191 07/30/2014     Recent Labs Lab 07/30/14 2221 07/31/14 0646  NA 137 135*  K 5.5* 5.5*  CL 100 99  CO2 21 19  BUN 51* 49*  CREATININE 3.38* 3.01*  CALCIUM 9.2 9.1  PROT 7.3  --   BILITOT 0.4  --   ALKPHOS 61  --   ALT 14  --   AST 15  --   GLUCOSE 154* 160*  ALBUMIN 3.6  --    Magnesium  Date Value Ref Range Status  07/31/2014 2.0  1.5 - 2.5 mg/dL Final    Recent Labs  16/10/96 0646 07/31/14 1205  TROPONINI <0.30 <0.30   Pro B Natriuretic peptide (BNP)  Date/Time Value Ref Range Status  07/30/2014 10:21 PM 7147.0* 0 - 450 pg/mL Final  07/23/2014 12:03 PM 6332.0* 0 - 450 pg/mL Final   TSH  Date/Time Value Ref Range Status  07/17/2014 12:53 PM 2.730  0.350 - 4.500 uIU/mL Final   Echo: 07/23/2014 Study Conclusions - Left ventricle: Pseudonormal pattern of diastolic dysfunction with elevated filling pressures. The cavity size was normal. Wall thickness was normal. Systolic function was normal. The estimated ejection fraction was in the range of 50% to 55%. No obvious regional wall motion abnormalities. - Aortic valve: No evidence of  vegetation. There was no regurgitation. - Aorta: Moderate non-mobile plaque seen in the descending thoracic aorta. - Mitral valve: There was mild to moderate regurgitation. - Left atrium: No evidence of thrombus in the atrial cavity or appendage. - Right ventricle: Systolic function was normal. - Right atrium: No evidence of thrombus in the atrial cavity or appendage. - Tricuspid valve: There was moderate regurgitation. - Pulmonary arteries: Systolic pressure was moderately increased. PA peak pressure: 52 mm Hg (S). Impressions: - Low normal LV systolic function with grade 2 diastolic dysfunction and significantly elevated filling pressures. Mild to moderate mitral regurgitation with no evidence of ruptured chordea. Moderate tricuspid regurgitation. At least moderate pulmonary hypertension (TR gradient 49 mmHg + RAP).  ECG: 07/31/2014 SR, wandering baseline No acute ischemic changes.  Radiology:  Dg Chest 2 View 07/31/2014   CLINICAL DATA:  Acute onset of chest pain.  EXAM: CHEST  2 VIEW  COMPARISON:  Chest radiograph performed 07/18/2014  FINDINGS: The lungs are well-aerated. Vascular congestion is noted, with diffusely increased interstitial markings, concerning for mild pulmonary edema. No pleural effusion or pneumothorax is seen.  The heart is borderline enlarged. The patient is status post median sternotomy. No acute osseous abnormalities are seen.  IMPRESSION: Vascular congestion and borderline cardiomegaly, with diffusely increased interstitial markings, concerning for mild recurrent interstitial edema.   Electronically Signed   By: Roanna Raider M.D.   On: 07/31/2014 03:25   US Renal 07/31/2014   CLINICAL DATA:  Acute kidney injury  EXAM: RENAL/URINARY TRACT ULTRASOUND COMPLETE  COMPARISON:  None.  FINDINGS: Right Kidney:  Length: 9.2 cm. Echogenicity remains lower than that of the adjacent liver. No mass or  hydronephrosis visualized.  Left Kidney:  Length: 8.8 cm. Echogenicity  within normal limits. No mass or hydronephrosis visualized.  Bladder:  Appears normal for degree of bladder distention.  IMPRESSION: There is no hydronephrosis nor other acute abnormality of the kidneys.   Electronically Signed   By: David  [place]   On: 07/31/2014 11:16    ASSESSMENT AND PLAN:   The patient was seen today by Dr. Excell Seltzer, the patient evaluated and the data reviewed.     Acute on chronic diastolic heart failure - Weight is up 7 lbs since discharge, pt was compliant with rx, denies SOB, has DOE, but denies acute worsening since d/c. MD advise on diuresis. Will need to emphasize the need for daily weights at discharge. The family assists in her care. D/C ACE/ARB.   Principal Problem:   Acute on chronic renal failure - renal US preliminary results are no hydronephrosis Active Problems:   Type 2 diabetes mellitus   Thyroid disorder   Vitamin B12 deficiency   Left main coronary artery disease   Acute renal failure  Signed: Theodore Demark, PA-C 07/31/2014 4:11 PM Beeper [phone]  Co-Sign MD  Patient seen, examined. Available data reviewed. Agree with findings, assessment, and plan as outlined by Theodore Demark, PA-C. The patient was independently interviewed and examined. Her family helps to translate, but she does appear able to understand and speak limited [place]. On exam, she is alert and oriented, in no distress. She is resting comfortably, lying flat in bed without shortness of breath during conversation. JVP is modestly elevated. Lungs are clear. Heart is regular rate and rhythm. I do not appreciate an S4 gallop. Extremities are without peripheral edema.  I have reviewed laboratory and radiographic data. There is evidence of mild volume overload with early interstitial edema on chest x-ray. Her weight is up. However, the patient's blood pressure is low and her renal function is now significantly impaired (acute kidney injury). I think it is clear she is no longer a candidate  for an ACE or ARB. Would continue carvedilol. Repeat metabolic panel tomorrow morning. Would only consider further diuresis if her creatinine is improving significantly, otherwise would hold Lasix at this point. BNP is significantly elevated, but with her known diastolic dysfunction and acute kidney injury, I'm not sure of the clinical significance. The patient otherwise feels well and is eager to leave the hospital. If her creatinine is trending down tomorrow, she probably could be followed closely with repeat blood work next week in the office.  Tonny Bollman, M.D. 07/31/2014 6:38 PM

## 2014-07-31 NOTE — ED Provider Notes (Signed)
CSN: [id number]     Arrival date & time 07/30/14  2158 History   First MD Initiated Contact with Patient 07/31/14 0005     Chief Complaint  Patient presents with  . Abnormal Lab     (Consider location/radiation/quality/duration/timing/severity/associated sxs/prior Treatment) HPI 80 yo female presents to the ER from home with reported abnormal lab.  Pt was seen in office today, had new Cr of 3.78.  Pt was d/c on 10/2 after heart catherization, had respiratory distress with myoview, acute CHF/pulm edema.  Pt was started on lisinopril on Friday at f/u visit.  Pt was taken off lasix and metformin-sitaglipin on Wednesday, and has been trying to increase fluid intake.  She denies sob, chest pain, abd pain, confusion, or decreased urination.  No n/v.  No prior h/o renal problems. Past Medical History  Diagnosis Date  . CAD S/P percutaneous coronary angioplasty 2005    prior LAD PCI - in 2005, LAD stent 100% occluded (mid LAD).  Marland Kitchen Left main coronary artery disease 03/2010    Ostial left main 60, OM1 70, circumflex 50-60, mid LAD stent occluded, mid RCA 30-40, RV marginal 90, EF 45-50% --> CABG - (Bartle) LIMA-LAD, SVG-D1, sSVG-OM1-OM2,  . Chronic diastolic CHF (congestive heart failure), NYHA class 2     Grade 2 Diastolic Dysfunction on Echo (hospitalized in [place] 01/2014 with CHF, no PTCA performed)  . Hx of echocardiogram 05/2014    EF 50-55%, inf-lat HK, Gr 2 DD, very mild AS (mean 10 mmHg), mild MR, PASP 36 mmHg; calcified density in LA (suggest TEE)  . Atherosclerotic peripheral vascular disease with intermittent claudication 04/2014    LEA Dopplers: RABI 1.02/ LABI: 0.61 (L SFA, Pop & DP blunted wave forms) -- correlates with L leg claudication  . Type 2 diabetes mellitus   . Hyperlipidemia with target LDL less than 70   . Vitamin B12 deficiency   . Thyroid disease   . RESPIRATORY ARREST assoc w MYOVIEW    Past Surgical History  Procedure Laterality Date  . Total knee arthroplasty    .  Cardiac catheterization  2005    LAD stent occluded  . Cardiac catheterization  03/2010     Ostial left main 60, OM1 70, circumflex 50-60, mid LAD stent occluded, mid RCA 30-40, RV marginal 90, EF 45-50%. >>> CABG  . Coronary artery bypass graft  03/2010    LIMA-LAD, SVG-D1, seqSVG-OM1-OM3   Family History  Problem Relation Age of Onset  . Ulcers    . Diabetes    . Arthritis    . Heart attack Neg Hx   . Diabetes    . Diabetes Father    History  Substance Use Topics  . Smoking status: Never Smoker   . Smokeless tobacco: Not on file  . Alcohol Use: No   OB History   Grav Para Term Preterm Abortions TAB SAB Ect Mult Living                 Review of Systems  All other systems reviewed and are negative.     Allergies  Myoview and Penicillins  Home Medications   Prior to Admission medications   Medication Sig Start Date End Date Taking? Authorizing Provider  carvedilol (COREG) 12.5 MG tablet Take 1 tablet (12.5 mg total) by mouth 2 (two) times daily with a meal. Carca from [place] (after breakfast and after dinner) 07/24/14  Yes Rhonda G Barrett, PA-C  clopidogrel (PLAVIX) 75 MG tablet Take 75 mg by mouth  daily.   Yes Historical Provider, MD  Cyanocobalamin (VITAMIN B-12) 5000 MCG SUBL Place 5,000 mcg under the tongue daily.   Yes Historical Provider, MD  ibuprofen (ADVIL,MOTRIN) 200 MG tablet Take 200 mg by mouth every 6 (six) hours as needed for moderate pain (pain).    Yes Historical Provider, MD  levothyroxine (SYNTHROID, LEVOTHROID) 125 MCG tablet Take 1 tablet (125 mcg total) by mouth daily before breakfast. 07/24/14  Yes Rhonda G Barrett, PA-C  lisinopril (PRINIVIL,ZESTRIL) 5 MG tablet Take 1 tablet (5 mg total) by mouth daily. 07/24/14  Yes Rhonda G Barrett, PA-C  rosuvastatin (CRESTOR) 10 MG tablet Take 1 tablet (10 mg total) by mouth daily after supper. Rosavel from [place] 07/24/14  Yes Rhonda G Barrett, PA-C   BP 118/56  Pulse 95  Temp(Src) 98 F (36.7 C) (Oral)  Resp  25  SpO2 97% Physical Exam  Nursing note and vitals reviewed. Constitutional: She is oriented to person, place, and time. She appears well-developed and well-nourished.  HENT:  Head: Normocephalic and atraumatic.  Nose: Nose normal.  Mouth/Throat: Oropharynx is clear and moist.  Eyes: Conjunctivae and EOM are normal. Pupils are equal, round, and reactive to light.  Neck: Normal range of motion. Neck supple. No JVD present. No tracheal deviation present. No thyromegaly present.  Cardiovascular: Normal rate, regular rhythm, normal heart sounds and intact distal pulses.  Exam reveals no gallop and no friction rub.   No murmur heard. Pulmonary/Chest: Effort normal and breath sounds normal. No stridor. No respiratory distress. She has no wheezes. She has no rales. She exhibits no tenderness.  Abdominal: Soft. Bowel sounds are normal. She exhibits no distension and no mass. There is no tenderness. There is no rebound and no guarding.  Musculoskeletal: Normal range of motion. She exhibits no edema and no tenderness.  Lymphadenopathy:    She has no cervical adenopathy.  Neurological: She is alert and oriented to person, place, and time. She displays normal reflexes. She exhibits normal muscle tone. Coordination normal.  Skin: Skin is warm and dry. No rash noted. No erythema. No pallor.  Psychiatric: She has a normal mood and affect. Her behavior is normal. Judgment and thought content normal.    ED Course  Procedures (including critical care time) Labs Review Labs Reviewed  CBC WITH DIFFERENTIAL - Abnormal; Notable for the following:    RBC 2.78 (*)    Hemoglobin 8.4 (*)    HCT 25.5 (*)    All other components within normal limits  COMPREHENSIVE METABOLIC PANEL - Abnormal; Notable for the following:    Potassium 5.5 (*)    Glucose, Bld 154 (*)    BUN 51 (*)    Creatinine, Ser 3.38 (*)    GFR calc non Af Amer 12 (*)    GFR calc Af Amer 14 (*)    Anion gap 16 (*)    All other components  within normal limits  URINALYSIS, ROUTINE W REFLEX MICROSCOPIC - Abnormal; Notable for the following:    APPearance CLOUDY (*)    Hgb urine dipstick TRACE (*)    Leukocytes, UA LARGE (*)    All other components within normal limits  PRO B NATRIURETIC PEPTIDE - Abnormal; Notable for the following:    Pro B Natriuretic peptide (BNP) 7147.0 (*)    All other components within normal limits  URINE MICROSCOPIC-ADD ON - Abnormal; Notable for the following:    Squamous Epithelial / LPF FEW (*)    All other components within normal  limits    Imaging Review Dg Chest 2 View  07/31/2014   CLINICAL DATA:  Acute onset of chest pain.  EXAM: CHEST  2 VIEW  COMPARISON:  Chest radiograph performed 07/18/2014  FINDINGS: The lungs are well-aerated. Vascular congestion is noted, with diffusely increased interstitial markings, concerning for mild pulmonary edema. No pleural effusion or pneumothorax is seen.  The heart is borderline enlarged. The patient is status post median sternotomy. No acute osseous abnormalities are seen.  IMPRESSION: Vascular congestion and borderline cardiomegaly, with diffusely increased interstitial markings, concerning for mild recurrent interstitial edema.   Electronically Signed   By: Roanna RaiderJeffery  Chang M.D.   On: 07/31/2014 03:25     EKG Interpretation None      Date: 07/31/2014  Rate: 96  Rhythm: normal sinus rhythm  QRS Axis: normal  Intervals: normal  ST/T Wave abnormalities: nonspecific T wave changes  Conduction Disutrbances:none  Narrative Interpretation: No peaked T waves, some t wave inversions noted  Old EKG Reviewed: none available   MDM   Final diagnoses:  Acute renal failure, unspecified acute renal failure type  Congestive heart failure, unspecified congestive heart failure chronicity, unspecified congestive heart failure type    80 yo female with acute renal failure, mild elevation in K.  Pt with increased BNP-difficult given h/o CHF with recent respiratory  distress flash pulmonary edema and new renal failure.  Pt has had some sob here in the ER, lungs clear.  Will d/w hospitalist for admission.    Olivia Mackielga M Kaydie Petsch, MD 07/31/14 [phone]

## 2014-07-31 NOTE — ED Notes (Signed)
Pt taken to restroom via wheelchair. Reports increased shortness of breath; requesting oxygen. O2 sats 98% on room air . Pt placed on two liters of oxygen. Dr.Otter at bedside speaking with patient's daughter.

## 2014-07-31 NOTE — Care Management Note (Addendum)
    Page 1 of 2   08/02/2014     3:57:47 PM CARE MANAGEMENT NOTE 08/02/2014  Patient:  Margaret [name], Margaret [name]   Account Number:  [id number]  Date Initiated:  07/31/2014  Documentation initiated by:  Oletta Cohn  Subjective/Objective Assessment:   80 y.o. female with PMHx of chf, hyperlipidemia, CAD, type 2 diabetes, vitamin B12 deficiency, who presents with abnormal lab with elevated creatinine.  //Home with spouse and son     Action/Plan:   She was found to have creatinine 3.38 in ED. Potassium 5.5 without EKG changes. Her proBNP is 7141. She is admitted to inpatient for further evaluation and treatment.//Resume [facility] services with different Jacquilyn Seldon.   Anticipated DC Date:  08/03/2014   Anticipated DC Plan:  HOME W HOME HEALTH SERVICES      DC Planning Services  CM consult      [facility]  Resumption Of Svcs/PTA [facility]   Choice offered to / List presented to:  C-1 Patient        HH arranged  HH-1 RN  HH-10 DISEASE MANAGEMENT  HH-2 PT  HH-3 OT      [facility] agency  [facility]   Status of service:  Completed, signed off Medicare Important Message given?   (If response is "NO", the following Medicare IM given date fields will be blank) Date Medicare IM given:   Medicare IM given by:   Date Additional Medicare IM given:   Additional Medicare IM given by:    Discharge Disposition:  HOME W HOME HEALTH SERVICES  Per UR Regulation:  Reviewed for med. necessity/level of care/duration of stay  If discussed at Long Length of Stay Meetings, dates discussed:    Comments:  08/02/14 13:00 CM notes Gigi Gin has discharged the pt without getting orders.  When asked she stated pt didnt' need it. CM called [facility] [place] inquire of the $96.00 copay; Judeth Cornfield states that is not the amount of a copay and agrees to call  the pt's family and they DO indeed wish to have HHPT/OT/RN.  CM texts MD and requests resumption orders and F2F be  placed; MD places order and F2F. CM calls [place] with  Resumption.  No other CM needs were communicated.  Freddy Jaksch, BSN, [place] [phone]. 08/02/14 10:00 CM requested RN peggy to get Resumption orders for HHPT/RN/OT.  Waiting for orders to be placed. Freddy Jaksch, BSN, [place] [phone].   07/31/14 1145 Oletta Cohn, RN, BSN, [place] [phone] [facility] spoke with the patient's daughter. She stated that the patient lives with her son and her husband. She stated that they were recently discharged with [facility] services with [facility] but the patient has been in and out of the PCP office and [facility] has not been able to visit. She stated that they were informed by [facility] that there would be a $96.00 charge with each [facility] visit and she feels that this is somewhat of a barrier to care. She stated that the preference is to dc the patient back home with [facility] services but preferably an agency that has a lower to no cost involved with services.  3E NCM will follow-up.

## 2014-07-31 NOTE — ED Notes (Signed)
Family refusing IV at this time. MD aware

## 2014-07-31 NOTE — Progress Notes (Signed)
[facility]  Patient Status: Active (receiving services up to time of hospitalization)  [facility] is providing the following services: RN and PT  If patient discharges after hours, please call [phone].   Margaret [name] 07/31/2014, 12:56 PM

## 2014-07-31 NOTE — Progress Notes (Signed)
Pt had BP 88/44, HR 79, asymptomatic.  Also, refused her Lasix 40mg  IV, as she states that her PCP stopped her Lasix because of her possible kidney problems.  Informed Dr. Wynema Birch of all and he is aware.

## 2014-07-31 NOTE — Progress Notes (Signed)
Pt refused Lasix 40mg  IV, as she states that her PCP stopped her taking Lasix due to possible kidney problems.

## 2014-07-31 NOTE — Progress Notes (Signed)
Patient admitted after midnight. Current and previous admission chart reviewed.  No stepdown beds. Stable for tele. Just discharged 10/2 from [facility] service. Ibuprofen, lisinopril appear to be new meds, also patient got right and left heart cath. ARF likely from these factors. Weight is up 7-8 lbs from a week ago and CXR shows CHF. Will continue lasix. Renal US ok. Have consulted [facility]  Crista Curb, MD [facility] [phone]

## 2014-07-31 NOTE — Progress Notes (Signed)
CARE MANAGEMENT NOTE 07/31/2014  Patient:  RHODIE, OLINDE   Account Number:  [id number]  Date Initiated:  07/31/2014  Documentation initiated by:  Ferdinand Cava  Subjective/Objective Assessment:   acute on chronic renal failure     Action/Plan:   CM to follow for disposition needs   Anticipated DC Date:     Anticipated DC Plan:  HOME W HOME HEALTH SERVICES      DC Planning Services  CM consult      Choice offered to / List presented to:  NA           Status of service:  In process, will continue to follow Medicare Important Message given?   (If response is "NO", the following Medicare IM given date fields will be blank) Date Medicare IM given:   Medicare IM given by:   Date Additional Medicare IM given:   Additional Medicare IM given by:    Discharge Disposition:    Per UR Regulation:    If discussed at Long Length of Stay Meetings, dates discussed:    Comments:  CM spoke with the patient's daughter. She stated that the patient lives with her son and her husband. She stated that they were recently discharged with [facility] services with [facility] but the patient has been in and out of the PCP office and [facility] has not been able to visit. She stated that they were informed by [facility] that there would be a $96.00 charge with each [facility] visit and she feels that this is somewhat of a barrier to care. She stated that the preference is to dc the patient back home with [facility] services but preferably an agency that has a lower to no cost involved with services.

## 2014-07-31 NOTE — Evaluation (Signed)
Physical Therapy Evaluation Patient Details Name: Margaret AversSudha Berwick MRN: [id number] DOB: 08/27/1934 Today's Date: 07/31/2014   History of Present Illness  Pt asked by PCP to come to ED due to elevated Creatinine levels.  In ED found to be up in weight with fluid overload.  Management continues  Clinical Impression  Pt admitted with/for elevated labs.  Fluid overloaded with CHF.  Pt currently limited functionally due to the problems listed below.  (see problems list.)  Pt will benefit from PT to maximize function and safety to be able to get home safely with available assist of family.     Follow Up Recommendations Home health PT;Other (comment) (needs to try to get her copay lowered or change therapy prov)    Equipment Recommendations  None recommended by PT    Recommendations for Other Services       Precautions / Restrictions Precautions Precautions: Fall Restrictions Weight Bearing Restrictions: No      Mobility  Bed Mobility Overal bed mobility: Modified Independent                Transfers Overall transfer level: Needs assistance Equipment used: Rolling walker (2 wheeled) Transfers: Sit to/from Stand Sit to Stand: Supervision         General transfer comment:  guard for safety  Ambulation/Gait Ambulation/Gait assistance: Min guard Ambulation Distance (Feet): 120 Feet (70 additional feet; no assistive device) Assistive device: None Gait Pattern/deviations: Step-through pattern     General Gait Details: generally steady, with some mild dyspnea, no loss of balance with scanning her environment  Stairs Stairs:  (pt deferred th stairs due to dyspnea.)          Wheelchair Mobility    Modified Rankin (Stroke Patients Only)       Balance Overall balance assessment: History of Falls;Needs assistance   Sitting balance-Leahy Scale: Good       Standing balance-Leahy Scale: Fair                               Pertinent Vitals/Pain Pain  Assessment: No/denies pain    Home Living Family/patient expects to be discharged to:: Private residence Living Arrangements: Children;Spouse/significant other Available Help at Discharge: Family;Available 24 hours/day Type of Home: House Home Access: Level entry     Home Layout: Two level;Able to live on main level with bedroom/bathroom Home Equipment: Dan HumphreysWalker - 2 wheels;Shower seat;Grab bars - tub/shower      Prior Function Level of Independence: Needs assistance   Gait / Transfers Assistance Needed: independent with walker  ADL's / Homemaking Assistance Needed: assist for shower        Hand Dominance   Dominant Hand: Right    Extremity/Trunk Assessment               Lower Extremity Assessment: Generalized weakness RLE Deficits / Details: grossly >=4/5 LLE Deficits / Details: strength grossly >=4/5     Communication   Communication: Prefers language other than [place];Interpreter utilized  Cognition Arousal/Alertness: Awake/alert Behavior During Therapy: WFL for tasks assessed/performed Overall Cognitive Status: Within Functional Limits for tasks assessed                      General Comments      Exercises        Assessment/Plan    PT Assessment Patient needs continued PT services  PT Diagnosis Generalized weakness   PT Problem List Decreased activity tolerance;Decreased strength;Decreased mobility;Cardiopulmonary  status limiting activity  PT Treatment Interventions Gait training;Stair training;Functional mobility training;Therapeutic activities;Patient/family education   PT Goals (Current goals can be found in the Care Plan section) Acute Rehab PT Goals Patient Stated Goal: no stated---I want to go home. Time For Goal Achievement: 08/07/14 Potential to Achieve Goals: Good    Frequency Min 3X/week   Barriers to discharge        Co-evaluation               End of Session   Activity Tolerance: Patient tolerated treatment  well Patient left: in bed;with call bell/phone within reach           Time: 1547-1608 PT Time Calculation (min): 21 min   Charges:   PT Evaluation $Initial PT Evaluation Tier I: 1 Procedure PT Treatments $Gait Training: 8-22 mins   PT G Codes:          Hendrixx Severin, Eliseo Gum 07/31/2014, 4:34 PM 07/31/2014  [place] Bing, PT [phone]  (pager)

## 2014-07-31 NOTE — H&P (Signed)
[facility] History and Physical  [name] ZOX:[id number] DOB: September 27, 1934 DOA: 07/30/2014  Referring physician: ED physician PCP: Redmond Baseman, MD  Specialists:   Chief Complaint: abnormal lab with elevation of Creatinin  HPI: Margaret [name] is a 80 y.o. female with past medical history of congestive heart failure, hyperlipidemia, CAD, type 2 diabetes, vitamin B12 deficiency, who presents with abnormal lab with elevated creatinine.  Pt was recently hospitalized due to acute CHF/pulm edema and discharged home 07/24/14. She was discharged on lisinopril and Lasix. She has been compliant to her medications. She has been doing well. She feels that her condition has been improving. Pt visit her PCP for follow up visit yesterday. She was found to have an elevated Creatinine level from baseline of 1.2-1.46  to 3.7. Her lasix and metformin were discontinued by PCP and was instructed to take more fluid, but the repeated BMP showed Cre increased further to 3.8. She was instructed to come to ED for further evaluation and treatment by her PCP. Patient does not have chest pain. She has shortness of breath which is worse than her baseline. She does not have fever, chills, nausea, vomiting, abdominal pain, diarrhea.  She was found to have creatinine 3.38 in ED. Potassium 5.5 without EKG changes. Her proBNP is 7141. She is admitted to inpatient for further evaluation and treatment  Review of Systems: As presented in the history of presenting illness, rest negative.  Where does patient live? Lives with her daugher  Can patient participate in ADLs? A little  Allergy:  Allergies  Allergen Reactions  . Myoview [Technetium-57m] Shortness Of Breath  . Penicillins Other (See Comments)    Unknown allergic reaction     Past Medical History  Diagnosis Date  . CAD S/P percutaneous coronary angioplasty 2005    prior LAD PCI - in 2005, LAD stent 100% occluded (mid LAD).  Marland Kitchen Left main coronary artery  disease 03/2010    Ostial left main 60, OM1 70, circumflex 50-60, mid LAD stent occluded, mid RCA 30-40, RV marginal 90, EF 45-50% --> CABG - (Bartle) LIMA-LAD, SVG-D1, sSVG-OM1-OM2,  . Chronic diastolic CHF (congestive heart failure), NYHA class 2     Grade 2 Diastolic Dysfunction on Echo (hospitalized in [place] 01/2014 with CHF, no PTCA performed)  . Hx of echocardiogram 05/2014    EF 50-55%, inf-lat HK, Gr 2 DD, very mild AS (mean 10 mmHg), mild MR, PASP 36 mmHg; calcified density in LA (suggest TEE)  . Atherosclerotic peripheral vascular disease with intermittent claudication 04/2014    LEA Dopplers: RABI 1.02/ LABI: 0.61 (L SFA, Pop & DP blunted wave forms) -- correlates with L leg claudication  . Type 2 diabetes mellitus   . Hyperlipidemia with target LDL less than 70   . Vitamin B12 deficiency   . Thyroid disease   . RESPIRATORY ARREST assoc w MYOVIEW     Past Surgical History  Procedure Laterality Date  . Total knee arthroplasty    . Cardiac catheterization  2005    LAD stent occluded  . Cardiac catheterization  03/2010     Ostial left main 60, OM1 70, circumflex 50-60, mid LAD stent occluded, mid RCA 30-40, RV marginal 90, EF 45-50%. >>> CABG  . Coronary artery bypass graft  03/2010    LIMA-LAD, SVG-D1, seqSVG-OM1-OM3    Social History:  reports that she has never smoked. She does not have any smokeless tobacco history on file. She reports that she does not drink alcohol or use illicit drugs.  Family History:  Family History  Problem Relation Age of Onset  . Ulcers    . Diabetes    . Arthritis    . Heart attack Neg Hx   . Diabetes    . Diabetes Father      Prior to Admission medications   Medication Sig Start Date End Date Taking? Authorizing Provider  carvedilol (COREG) 12.5 MG tablet Take 1 tablet (12.5 mg total) by mouth 2 (two) times daily with a meal. Carca from [place] (after breakfast and after dinner) 07/24/14  Yes Rhonda G Barrett, PA-C  clopidogrel (PLAVIX) 75 MG  tablet Take 75 mg by mouth daily.   Yes Historical Provider, MD  Cyanocobalamin (VITAMIN B-12) 5000 MCG SUBL Place 5,000 mcg under the tongue daily.   Yes Historical Provider, MD  ibuprofen (ADVIL,MOTRIN) 200 MG tablet Take 200 mg by mouth every 6 (six) hours as needed for moderate pain (pain).    Yes Historical Provider, MD  levothyroxine (SYNTHROID, LEVOTHROID) 125 MCG tablet Take 1 tablet (125 mcg total) by mouth daily before breakfast. 07/24/14  Yes Rhonda G Barrett, PA-C  lisinopril (PRINIVIL,ZESTRIL) 5 MG tablet Take 1 tablet (5 mg total) by mouth daily. 07/24/14  Yes Rhonda G Barrett, PA-C  rosuvastatin (CRESTOR) 10 MG tablet Take 1 tablet (10 mg total) by mouth daily after supper. Rosavel from [place] 07/24/14  Yes Darrol Jump, PA-C    Physical Exam: Filed Vitals:   07/30/14 2211 07/31/14 0111 07/31/14 0401  BP: 139/60 118/56 155/65  Pulse: 100 95 107  Temp: 98.4 F (36.9 C) 98 F (36.7 C)   TempSrc: Oral Oral   Resp: 20 25 26   SpO2: 99% 97% 100%   General: Not in acute distress HEENT:       Eyes: PERRL, EOMI, no scleral icterus       ENT: No discharge from the ears and nose, no pharynx injection, no tonsillar enlargement.        Neck: no bruit, no mass felt. Cardiac: S1/S2, RRR, No murmurs, gallops or rubs Pulm: Good air movement bilaterally. Clear to auscultation bilaterally. No rales, wheezing, rhonchi or rubs. Abd: Soft, nondistended, nontender, no rebound pain, no organomegaly, BS present Ext: 1+ pitting edema. 2+DP/PT pulse bilaterally Musculoskeletal: No joint deformities, erythema, or stiffness, ROM full Skin: No rashes.  Neuro: Alert and oriented X3, cranial nerves II-XII grossly intact, muscle strength 5/5 in all extremeties, sensation to light touch intact.  Psych: Patient is not psychotic, no suicidal or hemocidal ideation.  Labs on Admission:  Basic Metabolic Panel:  Recent Labs Lab 07/24/14 1005 07/30/14 2221  NA 137 137  K 5.0 5.5*  CL 97 100  CO2 29  21  GLUCOSE 268* 154*  BUN 29* 51*  CREATININE 1.46* 3.38*  CALCIUM 9.2 9.2   Liver Function Tests:  Recent Labs Lab 07/30/14 2221  AST 15  ALT 14  ALKPHOS 61  BILITOT 0.4  PROT 7.3  ALBUMIN 3.6   No results found for this basename: LIPASE, AMYLASE,  in the last 168 hours No results found for this basename: AMMONIA,  in the last 168 hours CBC:  Recent Labs Lab 07/24/14 1005 07/30/14 2221  WBC 6.4 6.9  NEUTROABS  --  5.0  HGB 8.7* 8.4*  HCT 27.2* 25.5*  MCV 95.8 91.7  PLT 164 191   Cardiac Enzymes: No results found for this basename: CKTOTAL, CKMB, CKMBINDEX, TROPONINI,  in the last 168 hours  BNP (last 3 results)  Recent Labs  07/17/14 1835 07/23/14 1203 07/30/14 2221  PROBNP 3446.0* 6332.0* 7147.0*   CBG:  Recent Labs Lab 07/24/14 0844 07/24/14 1208  GLUCAP 206* 198*    Radiological Exams on Admission: Dg Chest 2 View  07/31/2014   CLINICAL DATA:  Acute onset of chest pain.  EXAM: CHEST  2 VIEW  COMPARISON:  Chest radiograph performed 07/18/2014  FINDINGS: The lungs are well-aerated. Vascular congestion is noted, with diffusely increased interstitial markings, concerning for mild pulmonary edema. No pleural effusion or pneumothorax is seen.  The heart is borderline enlarged. The patient is status post median sternotomy. No acute osseous abnormalities are seen.  IMPRESSION: Vascular congestion and borderline cardiomegaly, with diffusely increased interstitial markings, concerning for mild recurrent interstitial edema.   Electronically Signed   By: Roanna RaiderJeffery  Chang M.D.   On: 07/31/2014 03:25    EKG: Independently reviewed.   Assessment/Plan Principal Problem:   Acute on chronic renal failure Active Problems:   Type 2 diabetes mellitus   Thyroid disorder   Vitamin B12 deficiency   Left main coronary artery disease   Acute on chronic diastolic heart failure  1. Acute on CKD: It is likely due to increased diuretics, lisinopril and ibuprofen use.   Acute on chronic CHF may have also contributed given her elevated proBNP. - will admit to SDU due to multiple comorbidities - hold lisinopril and ibuprofen - check renal US to rule out hydronephrosis - follow up renal function by BMP  2. CHF exacerbation: 2-D echo on 07/23/14 showed EF 50-55% with grade 2 diastolic dysfunction. Patient has worsening shortness of breath. Her pro BNP is elevated at 7147. chest x-ray showed pulmonary edema.  - will give one low dose of lasix, with understanding that this may worsen her renal function. However, I am hoping this will not happen due to the Starling mechanism. Her kidney may be better perfused with improved cardiac function. - trop x 3  3. DM-II:  - will hold home oral meds - SSI - check A1c   4. CAD: no chest pain. - trop x 3 - repeat EKG in am  5. Vb12 deficiency: Continue home vb12  DVT ppx: SQ Heparin    Code Status: Full code Family Communication:  Yes, patient's daughter at bed side Disposition Plan: Admit to inpatient   Date of Service 07/31/2014    Lorretta HarpIU, Tenille Morrill [facility] Pager [phone]  If 7PM-7AM, please contact night-coverage [email or web address] Password [email or web address] 07/31/2014, 4:07 AM

## 2014-07-31 NOTE — ED Notes (Signed)
Pt placed on NRB per Dr. Clyde Lundborg

## 2014-08-01 DIAGNOSIS — I501 Left ventricular failure: Secondary | ICD-10-CM

## 2014-08-01 DIAGNOSIS — N189 Chronic kidney disease, unspecified: Secondary | ICD-10-CM

## 2014-08-01 LAB — CBC
HEMATOCRIT: 25.1 % — AB (ref 36.0–46.0)
Hemoglobin: 8.1 g/dL — ABNORMAL LOW (ref 12.0–15.0)
MCH: 29.9 pg (ref 26.0–34.0)
MCHC: 32.3 g/dL (ref 30.0–36.0)
MCV: 92.6 fL (ref 78.0–100.0)
Platelets: 166 10*3/uL (ref 150–400)
RBC: 2.71 MIL/uL — AB (ref 3.87–5.11)
RDW: 13.5 % (ref 11.5–15.5)
WBC: 5.5 10*3/uL (ref 4.0–10.5)

## 2014-08-01 LAB — RENAL FUNCTION PANEL
Albumin: 3.3 g/dL — ABNORMAL LOW (ref 3.5–5.2)
Anion gap: 16 — ABNORMAL HIGH (ref 5–15)
BUN: 41 mg/dL — AB (ref 6–23)
CALCIUM: 9.2 mg/dL (ref 8.4–10.5)
CHLORIDE: 101 meq/L (ref 96–112)
CO2: 19 meq/L (ref 19–32)
CREATININE: 2.49 mg/dL — AB (ref 0.50–1.10)
GFR calc Af Amer: 20 mL/min — ABNORMAL LOW (ref >90)
GFR calc non Af Amer: 17 mL/min — ABNORMAL LOW (ref >90)
Glucose, Bld: 106 mg/dL — ABNORMAL HIGH (ref 70–99)
Phosphorus: 4.7 mg/dL — ABNORMAL HIGH (ref 2.3–4.6)
Potassium: 5.1 mEq/L (ref 3.7–5.3)
Sodium: 136 mEq/L — ABNORMAL LOW (ref 137–147)

## 2014-08-01 LAB — GLUCOSE, CAPILLARY
GLUCOSE-CAPILLARY: 177 mg/dL — AB (ref 70–99)
GLUCOSE-CAPILLARY: 176 mg/dL — AB (ref 70–99)
Glucose-Capillary: 121 mg/dL — ABNORMAL HIGH (ref 70–99)
Glucose-Capillary: 177 mg/dL — ABNORMAL HIGH (ref 70–99)

## 2014-08-01 MED ORDER — FUROSEMIDE 40 MG PO TABS
40.0000 mg | ORAL_TABLET | Freq: Every day | ORAL | Status: DC
  Administered 2014-08-01 – 2014-08-02 (×2): 40 mg via ORAL
  Filled 2014-08-01 (×2): qty 1

## 2014-08-01 MED ORDER — CARVEDILOL 6.25 MG PO TABS
6.2500 mg | ORAL_TABLET | Freq: Two times a day (BID) | ORAL | Status: DC
  Administered 2014-08-01 – 2014-08-02 (×2): 6.25 mg via ORAL
  Filled 2014-08-01 (×4): qty 1

## 2014-08-01 NOTE — Progress Notes (Signed)
[facility] PROGRESS NOTE  Margaret [name] GYF:[id number] DOB: 05/09/1934 DOA: 07/30/2014 PCP: Redmond Baseman, MD Summary discharged 10/2 from [facility] service with acute on chronic DHF.  Discharged on Ibuprofen, lisinopril appear to be new meds, also patient got right and left heart cath. Admission Weight is up 7-8 lbs from discharge and CXR shows CHF. Renal US ok.  Assessment/Plan:  Principal Problem:   Acute on chronic renal failure secondary to ACE I and recent cath, most likely. Improving. Active Problems: Hyperkalemia resolved   Type 2 diabetes mellitus hypothyroidism   Vitamin B12 deficiency   Left main coronary artery disease   Acute on chronic diastolic heart failure: diuretics changed to po.  Code Status:  full Family Communication:  husband Disposition Plan:  Home tomorrow if stable.  Consultants:  [facility]  Procedures:     Antibiotics:    HPI/Subjective: No complaints  Objective: Filed Vitals:   08/01/14 1000  BP: 119/68  Pulse: 92  Temp: 98 F (36.7 C)  Resp: 20    Intake/Output Summary (Last 24 hours) at 08/01/14 1410 Last data filed at 08/01/14 0535  Gross per 24 hour  Intake    120 ml  Output   1050 ml  Net   -930 ml   Filed Weights   07/31/14 1050 08/01/14 0552  Weight: 66.9 kg (147 lb 7.8 oz) 65.227 kg (143 lb 12.8 oz)    Exam:   General:  Asleep. Arousable. Lying flat  Cardiovascular: RRR without MGR  Respiratory: CTA without WRR  Abdomen: S, NT, ND  Ext: 1+ edema  Basic Metabolic Panel:  Recent Labs Lab 07/30/14 2221 07/31/14 0646 08/01/14 0404  NA 137 135* 136*  K 5.5* 5.5* 5.1  CL 100 99 101  CO2 21 19 19   GLUCOSE 154* 160* 106*  BUN 51* 49* 41*  CREATININE 3.38* 3.01* 2.49*  CALCIUM 9.2 9.1 9.2  MG  --  2.0  --   PHOS  --   --  4.7*   Liver Function Tests:  Recent Labs Lab 07/30/14 2221 08/01/14 0404  AST 15  --   ALT 14  --   ALKPHOS 61  --   BILITOT 0.4  --   PROT 7.3  --    ALBUMIN 3.6 3.3*   No results found for this basename: LIPASE, AMYLASE,  in the last 168 hours No results found for this basename: AMMONIA,  in the last 168 hours CBC:  Recent Labs Lab 07/30/14 2221 08/01/14 0404  WBC 6.9 5.5  NEUTROABS 5.0  --   HGB 8.4* 8.1*  HCT 25.5* 25.1*  MCV 91.7 92.6  PLT 191 166   Cardiac Enzymes:  Recent Labs Lab 07/31/14 0646 07/31/14 1205  TROPONINI <0.30 <0.30   BNP (last 3 results)  Recent Labs  07/17/14 1835 07/23/14 1203 07/30/14 2221  PROBNP 3446.0* 6332.0* 7147.0*   CBG:  Recent Labs Lab 07/31/14 1153 07/31/14 1637 07/31/14 2211 08/01/14 0705 08/01/14 1135  GLUCAP 119* 187* 109* 121* 177*    No results found for this or any previous visit (from the past 240 hour(s)).   Studies: Dg Chest 2 View  07/31/2014   CLINICAL DATA:  Acute onset of chest pain.  EXAM: CHEST  2 VIEW  COMPARISON:  Chest radiograph performed 07/18/2014  FINDINGS: The lungs are well-aerated. Vascular congestion is noted, with diffusely increased interstitial markings, concerning for mild pulmonary edema. No pleural effusion or pneumothorax is seen.  The heart is borderline enlarged. The patient  is status post median sternotomy. No acute osseous abnormalities are seen.  IMPRESSION: Vascular congestion and borderline cardiomegaly, with diffusely increased interstitial markings, concerning for mild recurrent interstitial edema.   Electronically Signed   By: Roanna RaiderJeffery  Chang M.D.   On: 07/31/2014 03:25   [place] Renal  07/31/2014   CLINICAL DATA:  Acute kidney injury  EXAM: RENAL/URINARY TRACT ULTRASOUND COMPLETE  COMPARISON:  None.  FINDINGS: Right Kidney:  Length: 9.2 cm. Echogenicity remains lower than that of the adjacent liver. No mass or hydronephrosis visualized.  Left Kidney:  Length: 8.8 cm. Echogenicity within normal limits. No mass or hydronephrosis visualized.  Bladder:  Appears normal for degree of bladder distention.  IMPRESSION: There is no hydronephrosis  nor other acute abnormality of the kidneys.   Electronically Signed   By: David  [place]   On: 07/31/2014 11:16    Scheduled Meds: . carvedilol  6.25 mg Oral BID WC  . clopidogrel  75 mg Oral Daily  . furosemide  40 mg Oral Daily  . heparin  5,000 Units Subcutaneous 3 times per day  . insulin aspart  0-9 Units Subcutaneous TID WC  . levothyroxine  125 mcg Oral QAC breakfast  . rosuvastatin  10 mg Oral QPC supper  . sodium chloride  3 mL Intravenous Q12H  . vitamin B-12  5,000 mcg Oral Daily   Continuous Infusions:   Time spent: 15 minutes  Dlynn Ranes L  [facility] Pager [phone]. If 7PM-7AM, please contact night-coverage at [email or web address], password [email or web address] 08/01/2014, 2:10 PM  LOS: 2 days

## 2014-08-01 NOTE — Progress Notes (Signed)
Primary cardiologist: Dr. Everette Rank  Subjective:   Patient was up walking in hall with her husband today. No chest pain.   Objective:   Temp:  [96.9 F (36.1 C)-98.6 F (37 C)] 98 F (36.7 C) (10/10 1000) Pulse Rate:  [79-101] 92 (10/10 1000) Resp:  [16-20] 20 (10/10 1000) BP: (88-126)/(42-68) 119/68 mmHg (10/10 1000) SpO2:  [98 %-100 %] 99 % (10/10 1000) Weight:  [143 lb 12.8 oz (65.227 kg)-147 lb 7.8 oz (66.9 kg)] 143 lb 12.8 oz (65.227 kg) (10/10 0552) Last BM Date: 07/31/14  Filed Weights   07/31/14 1050 08/01/14 0552  Weight: 147 lb 7.8 oz (66.9 kg) 143 lb 12.8 oz (65.227 kg)    Intake/Output Summary (Last 24 hours) at 08/01/14 1028 Last data filed at 08/01/14 0535  Gross per 24 hour  Intake    360 ml  Output   1250 ml  Net   -890 ml    Exam:  General: Appears comfortable.  Lungs: No rales.  Cardiac: RRR, soft apical systolic murmur.  Extremities: No pitting edema.   Lab Results:  Basic Metabolic Panel:  Recent Labs Lab 07/30/14 2221 07/31/14 0646 08/01/14 0404  NA 137 135* 136*  K 5.5* 5.5* 5.1  CL 100 99 101  CO2 21 19 19   GLUCOSE 154* 160* 106*  BUN 51* 49* 41*  CREATININE 3.38* 3.01* 2.49*  CALCIUM 9.2 9.1 9.2  MG  --  2.0  --     CBC:  Recent Labs Lab 07/30/14 2221 08/01/14 0404  WBC 6.9 5.5  HGB 8.4* 8.1*  HCT 25.5* 25.1*  MCV 91.7 92.6  PLT 191 166    Cardiac Enzymes:  Recent Labs Lab 07/31/14 0646 07/31/14 1205  TROPONINI <0.30 <0.30    BNP:  Recent Labs  07/17/14 1835 07/23/14 1203 07/30/14 2221  PROBNP 3446.0* 6332.0* 7147.0*    TEE (07/23/14):  Study Conclusions  - Left ventricle: Pseudonormal pattern of diastolic dysfunction with elevated filling pressures. The cavity size was normal. Wall thickness was normal. Systolic function was normal. The estimated ejection fraction was in the range of 50% to 55%. No obvious regional wall motion abnormalities. - Aortic valve: No evidence of  vegetation. There was no regurgitation. - Aorta: Moderate non-mobile plaque seen in the descending thoracic aorta. - Mitral valve: There was mild to moderate regurgitation. - Left atrium: No evidence of thrombus in the atrial cavity or appendage. - Right ventricle: Systolic function was normal. - Right atrium: No evidence of thrombus in the atrial cavity or appendage. - Tricuspid valve: There was moderate regurgitation. - Pulmonary arteries: Systolic pressure was moderately increased. PA peak pressure: 52 mm Hg (S).  Impressions:  - Low normal LV systolic function with grade 2 diastolic dysfunction and significantly elevated filling pressures. Mild to moderate mitral regurgitation with no evidence of ruptured chordea. Moderate tricuspid regurgitation. At least moderate pulmonary hypertension (TR gradient 49 mmHg + RAP).   Medications:   Scheduled Medications: . carvedilol  3.125 mg Oral BID WC  . clopidogrel  75 mg Oral Daily  . furosemide  40 mg Intravenous BID  . heparin  5,000 Units Subcutaneous 3 times per day  . insulin aspart  0-9 Units Subcutaneous TID WC  . levothyroxine  125 mcg Oral QAC breakfast  . rosuvastatin  10 mg Oral QPC supper  . sodium chloride  3 mL Intravenous Q12H  . vitamin B-12  5,000 mcg Oral Daily      Assessment:   1.  Acute on chronic diastolic heart failure. Weight is down 4 pounds, diuresis noted on IV Lasix.  2. Acute on chronic renal failure. Noted in the setting of an ACE-I and diuresis, also low blood pressure with question of ATN. Creatinine has come down to 2.4 from 3.0.  3. Relative hypotension, blood pressure improved. Carvedilol was cut back, and she has been taken off of lisinopril.  4. Multivessel CAD status post CABG 2011. Cardiac markers argue against ACS.  5. Mitral regurgitation, mild to moderate by TEE done recently.   Plan/Discussion:    Recommend dosing Coreg at 6.25 mg twice daily for now, continue Plavix and  Crestor. Reduce Lasix to 40 mg orally today. No further ACE inhibitor. Likely watch today, followup lab work and clinical status tomorrow, possible discharge at that time.   Jonelle SidleSamuel G. McDowell, M.D., F.A.C.C.

## 2014-08-02 DIAGNOSIS — E785 Hyperlipidemia, unspecified: Secondary | ICD-10-CM

## 2014-08-02 DIAGNOSIS — I5033 Acute on chronic diastolic (congestive) heart failure: Secondary | ICD-10-CM

## 2014-08-02 DIAGNOSIS — N289 Disorder of kidney and ureter, unspecified: Secondary | ICD-10-CM

## 2014-08-02 DIAGNOSIS — I340 Nonrheumatic mitral (valve) insufficiency: Secondary | ICD-10-CM

## 2014-08-02 LAB — BASIC METABOLIC PANEL
ANION GAP: 17 — AB (ref 5–15)
BUN: 37 mg/dL — ABNORMAL HIGH (ref 6–23)
CO2: 20 mEq/L (ref 19–32)
Calcium: 8.6 mg/dL (ref 8.4–10.5)
Chloride: 102 mEq/L (ref 96–112)
Creatinine, Ser: 2.09 mg/dL — ABNORMAL HIGH (ref 0.50–1.10)
GFR, EST AFRICAN AMERICAN: 25 mL/min — AB (ref >90)
GFR, EST NON AFRICAN AMERICAN: 22 mL/min — AB (ref >90)
Glucose, Bld: 146 mg/dL — ABNORMAL HIGH (ref 70–99)
POTASSIUM: 4.6 meq/L (ref 3.7–5.3)
SODIUM: 139 meq/L (ref 137–147)

## 2014-08-02 LAB — GLUCOSE, CAPILLARY: Glucose-Capillary: 132 mg/dL — ABNORMAL HIGH (ref 70–99)

## 2014-08-02 MED ORDER — FUROSEMIDE 40 MG PO TABS: 40.0000 mg | ORAL_TABLET | Freq: Every day | ORAL | Status: DC

## 2014-08-02 MED ORDER — CYANOCOBALAMIN 2500 MCG PO TABS: 5000.0000 ug | ORAL_TABLET | Freq: Every day | ORAL | Status: AC

## 2014-08-02 MED ORDER — CARVEDILOL 6.25 MG PO TABS: 6.2500 mg | ORAL_TABLET | Freq: Two times a day (BID) | ORAL | Status: DC

## 2014-08-02 NOTE — Discharge Summary (Signed)
Physician Discharge Summary  Margaret [name] ZOX:[id number] DOB: 07-Aug-1934 DOA: 07/30/2014  PCP: Redmond Baseman, MD  Admit date: 07/30/2014 Discharge date: 08/02/2014  Time spent: 35 minutes  Recommendations for Outpatient Follow-up:  followup in a week with Dr. Eldridge Dace or associate, should have BMET for this visit   Discharge Diagnoses:  Principal Problem:   Acute on chronic renal failure Active Problems:   Type 2 diabetes mellitus   Thyroid disorder   Vitamin B12 deficiency   Left main coronary artery disease   Acute on chronic diastolic heart failure   Acute renal failure   Discharge Condition: improved  Diet recommendation: low NA/diabetic  Filed Weights   07/31/14 1050 08/01/14 0552 08/02/14 0615  Weight: 66.9 kg (147 lb 7.8 oz) 65.227 kg (143 lb 12.8 oz) 66.5 kg (146 lb 9.7 oz)    History of present illness:  Margaret [name] is a 80 y.o. female with past medical history of congestive heart failure, hyperlipidemia, CAD, type 2 diabetes, vitamin B12 deficiency, who presents with abnormal lab with elevated creatinine.  Pt was recently hospitalized due to acute CHF/pulm edema and discharged home 07/24/14. She was discharged on lisinopril and Lasix. She has been compliant to her medications. She has been doing well. She feels that her condition has been improving. Pt visit her PCP for follow up visit yesterday. She was found to have an elevated Creatinine level from baseline of 1.2-1.46 to 3.7. Her lasix and metformin were discontinued by PCP and was instructed to take more fluid, but the repeated BMP showed Cre increased further to 3.8. She was instructed to come to ED for further evaluation and treatment by her PCP. Patient does not have chest pain. She has shortness of breath which is worse than her baseline. She does not have fever, chills, nausea, vomiting, abdominal pain, diarrhea.  She was found to have creatinine 3.38 in ED. Potassium 5.5 without EKG changes. Her proBNP is  7141. She is admitted to inpatient for further evaluation and treatment   Hospital Course:  Acute on chronic renal failure secondary to ACE I and recent cath, most likely. Improving.  Hyperkalemia resolved  Type 2 diabetes mellitus  hypothyroidism  Vitamin B12 deficiency  Left main coronary artery disease   Acute on chronic diastolic heart failure.  Daily weights Continue on Lasix 40mg  po qd.  Coreg changed from 12.5 to to 6.25mg  BID due to hypotension. ACE d/c'd in the setting of AKI   Procedures:    Consultations:  cardiology  Discharge Exam: Filed Vitals:   08/02/14 0615  BP: 100/52  Pulse: 82  Temp: 97.7 F (36.5 C)  Resp: 16    General: A+Ox3, NAD- husband at bedside   Discharge Instructions You were cared for by a hospitalist during your hospital stay. If you have any questions about your discharge medications or the care you received while you were in the hospital after you are discharged, you can call the unit and asked to speak with the hospitalist on call if the hospitalist that took care of you is not available. Once you are discharged, your primary care physician will handle any further medical issues. Please note that NO REFILLS for any discharge medications will be authorized once you are discharged, as it is imperative that you return to your primary care physician (or establish a relationship with a primary care physician if you do not have one) for your aftercare needs so that they can reassess your need for medications and monitor your lab values.  Discharge Instructions   (HEART FAILURE PATIENTS) Call MD:  Anytime you have any of the following symptoms: 1) 3 pound weight gain in 24 hours or 5 pounds in 1 week 2) shortness of breath, with or without a dry hacking cough 3) swelling in the hands, feet or stomach 4) if you have to sleep on extra pillows at night in order to breathe.    Complete by:  As directed      Diet - low sodium heart healthy     Complete by:  As directed      Diet Carb Modified    Complete by:  As directed      Discharge instructions    Complete by:  As directed   Follow up with Dr. Eldridge Dace in 1 week with BMP at that time     Increase activity slowly    Complete by:  As directed           Current Discharge Medication List    START taking these medications   Details  Cyanocobalamin (VITAMIN B-12) 2500 MCG TABS Take 5,000 mcg by mouth daily. Qty: 30 tablet, Refills: 0    furosemide (LASIX) 40 MG tablet Take 1 tablet (40 mg total) by mouth daily. Qty: 30 tablet, Refills: 0      CONTINUE these medications which have CHANGED   Details  carvedilol (COREG) 6.25 MG tablet Take 1 tablet (6.25 mg total) by mouth 2 (two) times daily with a meal. Qty: 60 tablet, Refills: 0      CONTINUE these medications which have NOT CHANGED   Details  clopidogrel (PLAVIX) 75 MG tablet Take 75 mg by mouth daily.    levothyroxine (SYNTHROID, LEVOTHROID) 125 MCG tablet Take 1 tablet (125 mcg total) by mouth daily before breakfast. Qty: 30 tablet, Refills: 3    rosuvastatin (CRESTOR) 10 MG tablet Take 1 tablet (10 mg total) by mouth daily after supper. Rosavel from [place] Qty: 30 tablet, Refills: 6      STOP taking these medications     Cyanocobalamin (VITAMIN B-12) 5000 MCG SUBL      ibuprofen (ADVIL,MOTRIN) 200 MG tablet      lisinopril (PRINIVIL,ZESTRIL) 5 MG tablet        Allergies  Allergen Reactions  . Myoview [Technetium-72m] Shortness Of Breath  . Penicillins Other (See Comments)    Unknown allergic reaction    Follow-up Information   Follow up with Corky Crafts., MD. (The office will call you to make an appoinment., If you do not hear from them, please contact them., You should be seen within 1-2 weeks.)    Specialty:  Interventional Cardiology   Contact information:   [address] [place] [address] [phone]       Please follow up. (BMP 1 week)        The  results of significant diagnostics from this hospitalization (including imaging, microbiology, ancillary and laboratory) are listed below for reference.    Significant Diagnostic Studies: Dg Chest 2 View  07/31/2014   CLINICAL DATA:  Acute onset of chest pain.  EXAM: CHEST  2 VIEW  COMPARISON:  Chest radiograph performed 07/18/2014  FINDINGS: The lungs are well-aerated. Vascular congestion is noted, with diffusely increased interstitial markings, concerning for mild pulmonary edema. No pleural effusion or pneumothorax is seen.  The heart is borderline enlarged. The patient is status post median sternotomy. No acute osseous abnormalities are seen.  IMPRESSION: Vascular congestion and borderline cardiomegaly, with diffusely increased interstitial  markings, concerning for mild recurrent interstitial edema.   Electronically Signed   By: Roanna Raider M.D.   On: 07/31/2014 03:25   US Renal  07/31/2014   CLINICAL DATA:  Acute kidney injury  EXAM: RENAL/URINARY TRACT ULTRASOUND COMPLETE  COMPARISON:  None.  FINDINGS: Right Kidney:  Length: 9.2 cm. Echogenicity remains lower than that of the adjacent liver. No mass or hydronephrosis visualized.  Left Kidney:  Length: 8.8 cm. Echogenicity within normal limits. No mass or hydronephrosis visualized.  Bladder:  Appears normal for degree of bladder distention.  IMPRESSION: There is no hydronephrosis nor other acute abnormality of the kidneys.   Electronically Signed   By: David  [place]   On: 07/31/2014 11:16   Dg Chest Port 1 View  07/18/2014   CLINICAL DATA:  Pulmonary edema.  EXAM: PORTABLE CHEST - 1 VIEW  COMPARISON:  07/17/2014  FINDINGS: Lungs are adequately inflated with significant interval improvement in the previously in diffuse bilateral airspace process likely improving interstitial edema. There is persistent more focal airspace opacification and lung bases which may be due to persistent asymmetric edema although cannot exclude infection. Cardiomediastinal  silhouette and remainder of the exam is unchanged.  IMPRESSION: Significant interval improvement likely improved interstitial edema. More focal airspace opacification over the lung bases persists which may be due to asymmetric edema versus infection.   Electronically Signed   By: Elberta Fortis M.D.   On: 07/18/2014 07:44   Dg Chest Portable 1 View  07/17/2014   CLINICAL DATA:  Shortness of breath following trauma  EXAM: PORTABLE CHEST - 1 VIEW  COMPARISON:  04/19/2010  FINDINGS: Cardiac shadow is prominent. Postsurgical changes are again seen. Diffuse vascular congestion and bilateral fluffy infiltrates are noted consistent with CHF and pulmonary edema. No definitive bony abnormality is seen.  IMPRESSION: Changes consistent with CHF pulmonary edema.   Electronically Signed   By: Alcide Clever M.D.   On: 07/17/2014 13:05    Microbiology: No results found for this or any previous visit (from the past 240 hour(s)).   Labs: Basic Metabolic Panel:  Recent Labs Lab 07/30/14 2221 07/31/14 0646 08/01/14 0404 08/02/14 0353  NA 137 135* 136* 139  K 5.5* 5.5* 5.1 4.6  CL 100 99 101 102  CO2 21 19 19 20   GLUCOSE 154* 160* 106* 146*  BUN 51* 49* 41* 37*  CREATININE 3.38* 3.01* 2.49* 2.09*  CALCIUM 9.2 9.1 9.2 8.6  MG  --  2.0  --   --   PHOS  --   --  4.7*  --    Liver Function Tests:  Recent Labs Lab 07/30/14 2221 08/01/14 0404  AST 15  --   ALT 14  --   ALKPHOS 61  --   BILITOT 0.4  --   PROT 7.3  --   ALBUMIN 3.6 3.3*   No results found for this basename: LIPASE, AMYLASE,  in the last 168 hours No results found for this basename: AMMONIA,  in the last 168 hours CBC:  Recent Labs Lab 07/30/14 2221 08/01/14 0404  WBC 6.9 5.5  NEUTROABS 5.0  --   HGB 8.4* 8.1*  HCT 25.5* 25.1*  MCV 91.7 92.6  PLT 191 166   Cardiac Enzymes:  Recent Labs Lab 07/31/14 0646 07/31/14 1205  TROPONINI <0.30 <0.30   BNP: BNP (last 3 results)  Recent Labs  07/17/14 1835 07/23/14 1203  07/30/14 2221  PROBNP 3446.0* 6332.0* 7147.0*   CBG:  Recent Labs Lab 08/01/14 0705  08/01/14 1135 08/01/14 1624 08/01/14 2202 08/02/14 0650  GLUCAP 121* 177* 176* 177* 132*       Signed:  Senon Nixon  [facility] 08/02/2014, 8:26 AM

## 2014-08-02 NOTE — Progress Notes (Signed)
Pt d/c'd home with family. D/c instructions given to and d/w pt and family

## 2014-08-02 NOTE — Progress Notes (Signed)
Patient Name: Margaret [name] Date of Encounter: 08/02/2014     Principal Problem:   Acute on chronic renal failure Active Problems:   Type 2 diabetes mellitus   Thyroid disorder   Vitamin B12 deficiency   Left main coronary artery disease   Acute on chronic diastolic heart failure   Acute renal failure    SUBJECTIVE  Feeling well > ready to go home. Laying flat. No CP or SOB.   CURRENT MEDS . carvedilol  6.25 mg Oral BID WC  . clopidogrel  75 mg Oral Daily  . furosemide  40 mg Oral Daily  . heparin  5,000 Units Subcutaneous 3 times per day  . insulin aspart  0-9 Units Subcutaneous TID WC  . levothyroxine  125 mcg Oral QAC breakfast  . rosuvastatin  10 mg Oral QPC supper  . sodium chloride  3 mL Intravenous Q12H  . vitamin B-12  5,000 mcg Oral Daily    OBJECTIVE  Filed Vitals:   08/01/14 1603 08/01/14 1800 08/01/14 2224 08/02/14 0615  BP: 110/54 98/57 95/53  100/52  Pulse:  81 85 82  Temp:  98.3 F (36.8 C) 97.4 F (36.3 C) 97.7 F (36.5 C)  TempSrc:  Oral Oral Oral  Resp:  20 18 16   Height:      Weight:    146 lb 9.7 oz (66.5 kg)  SpO2:  100% 100% 100%    Intake/Output Summary (Last 24 hours) at 08/02/14 0753 Last data filed at 08/01/14 1400  Gross per 24 hour  Intake    240 ml  Output    200 ml  Net     40 ml   Filed Weights   07/31/14 1050 08/01/14 0552 08/02/14 0615  Weight: 147 lb 7.8 oz (66.9 kg) 143 lb 12.8 oz (65.227 kg) 146 lb 9.7 oz (66.5 kg)    PHYSICAL EXAM  General: Pleasant, NAD. Neuro: Alert and oriented X 3. Moves all extremities spontaneously. Psych: Normal affect. HEENT:  Normal  Neck: Supple without bruits or JVD. Lungs:  Resp regular and unlabored, CTA. Heart: RRR no s3, s4, or murmurs. Abdomen: Soft, non-tender, non-distended, BS + x 4.  Extremities: No clubbing, cyanosis or edema. DP/PT/Radials 2+ and equal bilaterally. Chronic LE edema bilaterally.   Accessory Clinical Findings  CBC  Recent Labs  07/30/14 2221  08/01/14 0404  WBC 6.9 5.5  NEUTROABS 5.0  --   HGB 8.4* 8.1*  HCT 25.5* 25.1*  MCV 91.7 92.6  PLT 191 166   Basic Metabolic Panel  Recent Labs  07/31/14 0646 08/01/14 0404 08/02/14 0353  NA 135* 136* 139  K 5.5* 5.1 4.6  CL 99 101 102  CO2 19 19 20   GLUCOSE 160* 106* 146*  BUN 49* 41* 37*  CREATININE 3.01* 2.49* 2.09*  CALCIUM 9.1 9.2 8.6  MG 2.0  --   --   PHOS  --  4.7*  --    Liver Function Tests  Recent Labs  07/30/14 2221 08/01/14 0404  AST 15  --   ALT 14  --   ALKPHOS 61  --   BILITOT 0.4  --   PROT 7.3  --   ALBUMIN 3.6 3.3*    Cardiac Enzymes  Recent Labs  07/31/14 0646 07/31/14 1205  TROPONINI <0.30 <0.30   Hemoglobin A1C  Recent Labs  07/31/14 0646  HGBA1C 6.1*    TELE  NSR  Radiology/Studies  Dg Chest 2 View  07/31/2014   CLINICAL DATA:  Acute  onset of chest pain.  EXAM: CHEST  2 VIEW  COMPARISON:  Chest radiograph performed 07/18/2014  FINDINGS: The lungs are well-aerated. Vascular congestion is noted, with diffusely increased interstitial markings, concerning for mild pulmonary edema. No pleural effusion or pneumothorax is seen.  The heart is borderline enlarged. The patient is status post median sternotomy. No acute osseous abnormalities are seen.  IMPRESSION: Vascular congestion and borderline cardiomegaly, with diffusely increased interstitial markings, concerning for mild recurrent interstitial edema.   Electronically Signed   By: Roanna RaiderJeffery  Chang M.D.   On: 07/31/2014 03:25   [place] Renal  07/31/2014   CLINICAL DATA:  Acute kidney injury  EXAM: RENAL/URINARY TRACT ULTRASOUND COMPLETE  COMPARISON:  None.  FINDINGS: Right Kidney:  Length: 9.2 cm. Echogenicity remains lower than that of the adjacent liver. No mass or hydronephrosis visualized.  Left Kidney:  Length: 8.8 cm. Echogenicity within normal limits. No mass or hydronephrosis visualized.  Bladder:  Appears normal for degree of bladder distention.  IMPRESSION: There is no  hydronephrosis nor other acute abnormality of the kidneys.   Electronically Signed   By: David  [place]   On: 07/31/2014 11:16   Dg Chest Port 1 View  07/18/2014   CLINICAL DATA:  Pulmonary edema.  EXAM: PORTABLE CHEST - 1 VIEW  COMPARISON:  07/17/2014  FINDINGS: Lungs are adequately inflated with significant interval improvement in the previously in diffuse bilateral airspace process likely improving interstitial edema. There is persistent more focal airspace opacification and lung bases which may be due to persistent asymmetric edema although cannot exclude infection. Cardiomediastinal silhouette and remainder of the exam is unchanged.  IMPRESSION: Significant interval improvement likely improved interstitial edema. More focal airspace opacification over the lung bases persists which may be due to asymmetric edema versus infection.   Electronically Signed   By: Elberta Fortisaniel  Boyle M.D.   On: 07/18/2014 07:44   Dg Chest Portable 1 View  07/17/2014   CLINICAL DATA:  Shortness of breath following trauma  EXAM: PORTABLE CHEST - 1 VIEW  COMPARISON:  04/19/2010  FINDINGS: Cardiac shadow is prominent. Postsurgical changes are again seen. Diffuse vascular congestion and bilateral fluffy infiltrates are noted consistent with CHF and pulmonary edema. No definitive bony abnormality is seen.  IMPRESSION: Changes consistent with CHF pulmonary edema.   Electronically Signed   By: Alcide CleverMark  Lukens M.D.   On: 07/17/2014 13:05    ASSESSMENT AND PLAN  1. Acute on chronic diastolic heart failure.  -- Weight is up 3 lbs from yesterday but 1 lbs less than on admission.  Neg 850mL. -- Continue on Lasix 40mg  po qd. -- Coreg changed from 12.5 to to 6.25mg  BID due to hypotension. ACE d/c'd in the setting of AKI  2. Acute on chronic renal failure.  -- Noted in the setting of an ACE-I and diuresis, also low blood pressure with question of ATN.  -- ACEi has been discontinued  -- Creatinine has come down to 2.4 from 3.0 yesterday. Now  2.09.   3. Relative hypotension, blood pressure improved but remain soft.  -- Carvedilol was cut back and she has been taken off of lisinopril.   4. Multivessel CAD status post CABG 2011.  -- Cardiac markers argue against ACS.  -- Continue Plavix and Crestor  5. Mitral regurgitation, mild to moderate by TEE done recently.  Dispo- creat stable, okay to to DC from a cards standpoint with close follow up in the clinic with Dr. Eldridge DaceVaranasi   Signed, Cline CrockKathryn Thompson PA-C  Pager  [phone]   Attending note:  Patient seen and examined. Agree with above assessment by Ms. [employer]. Ms. Senat feels better today, ambulated in the hall several times yesterday. Her blood pressure has been more stable, systolic around 100. Weight is also down with fairly mild diuresis overall, and creatinine has continued to improve down to 2.0. Should be ready for discharge home today. Medications have been adjusted - Coreg 6.25 mg twice daily, Lasix 40 mg daily, continue Plavix and Crestor. Do not resume ACE inhibitor. She will need followup in a week with Dr. Eldridge Dace or associate, should have BMET for this visit.  Jonelle Sidle, M.D., F.A.C.C.

## 2014-08-03 ENCOUNTER — Telehealth: Payer: Self-pay | Admitting: Cardiovascular Disease

## 2014-08-03 NOTE — Telephone Encounter (Signed)
I spoke with the pt's son and made him aware of appointments for vascular studies and Dr Kirke Corin follow up.  The pt was discharged from the hospital yesterday and per DC instructions she should be scheduled to see her cardiologist Dr Eldridge Dace in 1 week with a BMP. I will forward this message to Amy to follow up with the pt's son.

## 2014-08-03 NOTE — Telephone Encounter (Signed)
Pts daughter Coralee North notified of appt on 08/10/14 per demographics.

## 2014-08-03 NOTE — Telephone Encounter (Signed)
Tonya at [facility] notified.

## 2014-08-03 NOTE — Telephone Encounter (Signed)
New Prob   Pt is requesting to speak to a nurse regarding upcoming appointment for follow up. Please call.

## 2014-08-04 ENCOUNTER — Ambulatory Visit: Payer: Managed Care, Other (non HMO) | Admitting: Cardiovascular Disease

## 2014-08-06 ENCOUNTER — Ambulatory Visit (HOSPITAL_COMMUNITY): Payer: Managed Care, Other (non HMO) | Attending: Cardiovascular Disease | Admitting: Cardiology

## 2014-08-06 ENCOUNTER — Ambulatory Visit: Payer: Managed Care, Other (non HMO) | Admitting: Interventional Cardiology

## 2014-08-06 ENCOUNTER — Ambulatory Visit (HOSPITAL_BASED_OUTPATIENT_CLINIC_OR_DEPARTMENT_OTHER): Payer: Managed Care, Other (non HMO) | Admitting: Cardiology

## 2014-08-06 DIAGNOSIS — I739 Peripheral vascular disease, unspecified: Secondary | ICD-10-CM

## 2014-08-06 DIAGNOSIS — I2510 Atherosclerotic heart disease of native coronary artery without angina pectoris: Secondary | ICD-10-CM | POA: Insufficient documentation

## 2014-08-06 DIAGNOSIS — E785 Hyperlipidemia, unspecified: Secondary | ICD-10-CM | POA: Diagnosis not present

## 2014-08-06 DIAGNOSIS — Z951 Presence of aortocoronary bypass graft: Secondary | ICD-10-CM | POA: Insufficient documentation

## 2014-08-06 DIAGNOSIS — E119 Type 2 diabetes mellitus without complications: Secondary | ICD-10-CM | POA: Diagnosis not present

## 2014-08-06 DIAGNOSIS — I700 Atherosclerosis of aorta: Secondary | ICD-10-CM

## 2014-08-06 DIAGNOSIS — I70212 Atherosclerosis of native arteries of extremities with intermittent claudication, left leg: Secondary | ICD-10-CM

## 2014-08-06 NOTE — Progress Notes (Signed)
Abdominal aorta duplex performed  

## 2014-08-06 NOTE — Progress Notes (Signed)
Lower arterial doppler and bilateral duplex performed  

## 2014-08-10 ENCOUNTER — Ambulatory Visit: Payer: Managed Care, Other (non HMO) | Admitting: Interventional Cardiology

## 2014-08-11 ENCOUNTER — Encounter: Payer: Self-pay | Admitting: Cardiovascular Disease

## 2014-08-11 ENCOUNTER — Ambulatory Visit (INDEPENDENT_AMBULATORY_CARE_PROVIDER_SITE_OTHER): Payer: Managed Care, Other (non HMO) | Admitting: Cardiovascular Disease

## 2014-08-11 VITALS — BP 150/78 | HR 110 | Ht <= 58 in | Wt 143.8 lb

## 2014-08-11 DIAGNOSIS — N189 Chronic kidney disease, unspecified: Secondary | ICD-10-CM

## 2014-08-11 DIAGNOSIS — N179 Acute kidney failure, unspecified: Secondary | ICD-10-CM

## 2014-08-11 DIAGNOSIS — I25708 Atherosclerosis of coronary artery bypass graft(s), unspecified, with other forms of angina pectoris: Secondary | ICD-10-CM

## 2014-08-11 DIAGNOSIS — I340 Nonrheumatic mitral (valve) insufficiency: Secondary | ICD-10-CM

## 2014-08-11 DIAGNOSIS — I739 Peripheral vascular disease, unspecified: Secondary | ICD-10-CM

## 2014-08-11 DIAGNOSIS — I70219 Atherosclerosis of native arteries of extremities with intermittent claudication, unspecified extremity: Secondary | ICD-10-CM

## 2014-08-11 MED ORDER — CARVEDILOL 12.5 MG PO TABS: 12.5000 mg | ORAL_TABLET | Freq: Two times a day (BID) | ORAL | Status: DC

## 2014-08-11 NOTE — Patient Instructions (Signed)
Your physician has recommended you make the following change in your medication: INCREASE Carvedilol to 12.5mg  take one by mouth twice a day  Your physician has requested that you have a renal artery duplex in 2 WEEKS. During this test, an ultrasound is used to evaluate blood flow to the kidneys. Allow one hour for this exam. Do not eat after midnight the day before and avoid carbonated beverages. Take your medications as you usually do.  Your physician wants you to follow-up in: 3 MONTHS with Dr Kirke Corin. You will receive a reminder letter in the mail two months in advance. If you don't receive a letter, please call our office to schedule the follow-up appointment.

## 2014-08-11 NOTE — Progress Notes (Signed)
Primary cardiologist: Dr. Eldridge DaceVaranasi  HPI  This is a 80 year old female who is here today for a follow up visit regarding PAD. She is accompanied by husband and son who helped with interpretation. She has known  history of CAD status post CABG in 2011, diabetes, HL, hypothyroidism.  She was admitted to the hospital in [place] in May. It appears she had congestive heart failure and renal failure.  She was seen by me for mild left leg claudication with suspected inflow disease. She was admitted 09/25 thru 10/02 after she held her Lasix and BB for a Myoview. She had a Lexiscan injection, developed diarrhea, acute CHF requiring BiPAP and was hypoxic.  She was diuresed and then a R/L heart cath was performed. She had ischemic MR due to occlusion of SVG-OM, medical therapy. TEE showed mod TR and mod pulm HTN, but no ruptured chordae. She was started on an ACE inhibitor. She went to her family MD for f/u visit and blood work was performed which showed significantly worsened renal function. She was taken off Lasix and metformin. When her renal function did not improve, she was instructed to come to the ER, and was admitted 10/09. Renal function improved gradually and was discharged home. She complains of mild left leg discomfort with walking which has not been lifestyle limiting. Recent noninvasive evaluation showed a normal ABI on the right side and 0.88 on the left. Duplex showed evidence of proximal left common femoral artery stenosis.    Allergies  Allergen Reactions  . Myoview [Technetium-9780m] Shortness Of Breath  . Penicillins Other (See Comments)    Unknown allergic reaction      Current Outpatient Prescriptions on File Prior to Visit  Medication Sig Dispense Refill  . clopidogrel (PLAVIX) 75 MG tablet Take 75 mg by mouth daily.      . Cyanocobalamin (VITAMIN B-12) 2500 MCG TABS Take 5,000 mcg by mouth daily.  30 tablet  0  . levothyroxine (SYNTHROID, LEVOTHROID) 125 MCG tablet Take 1 tablet  (125 mcg total) by mouth daily before breakfast.  30 tablet  3  . rosuvastatin (CRESTOR) 10 MG tablet Take 1 tablet (10 mg total) by mouth daily after supper. Rosavel from [place]  30 tablet  6   No current facility-administered medications on file prior to visit.     Past Medical History  Diagnosis Date  . CAD S/P percutaneous coronary angioplasty 2005    prior LAD PCI - in 2005, LAD stent 100% occluded (mid LAD).  Marland Kitchen. Left main coronary artery disease 03/2010    Ostial left main 60, OM1 70, circumflex 50-60, mid LAD stent occluded, mid RCA 30-40, RV marginal 90, EF 45-50% --> CABG - (Bartle) LIMA-LAD, SVG-D1, sSVG-OM1-OM2,  . Chronic diastolic CHF (congestive heart failure), NYHA class 2     Grade 2 Diastolic Dysfunction on Echo (hospitalized in [place] 01/2014 with CHF, no PTCA performed)  . Hx of echocardiogram 05/2014    EF 50-55%, inf-lat HK, Gr 2 DD, very mild AS (mean 10 mmHg), mild MR, PASP 36 mmHg; calcified density in LA (suggest TEE)  . Atherosclerotic peripheral vascular disease with intermittent claudication 04/2014    LEA Dopplers: RABI 1.02/ LABI: 0.61 (L SFA, Pop & DP blunted wave forms) -- correlates with L leg claudication  . Type 2 diabetes mellitus   . Hyperlipidemia with target LDL less than 70   . Vitamin B12 deficiency   . Thyroid disease   . RESPIRATORY ARREST assoc w MYOVIEW  Past Surgical History  Procedure Laterality Date  . Total knee arthroplasty    . Cardiac catheterization  2005    LAD stent occluded  . Cardiac catheterization  03/2010     Ostial left main 60, OM1 70, circumflex 50-60, mid LAD stent occluded, mid RCA 30-40, RV marginal 90, EF 45-50%. >>> CABG  . Coronary artery bypass graft  03/2010    LIMA-LAD, SVG-D1, seqSVG-OM1-OM3     Family History  Problem Relation Age of Onset  . Ulcers    . Diabetes    . Arthritis    . Heart attack Neg Hx   . Diabetes    . Diabetes Father      History   Social History  . Marital Status: Single     Spouse Name: N/A    Number of Children: N/A  . Years of Education: N/A   Occupational History  . Retired    Social History Main Topics  . Smoking status: Never Smoker   . Smokeless tobacco: Not on file  . Alcohol Use: No  . Drug Use: No  . Sexual Activity: Not on file   Other Topics Concern  . Not on file   Social History Narrative   Son is POA.   Does not smoke - never smoked.   Spends 1/2 year in [place] & 1/2 year in [place].     ROS A 10 point review of system was performed. It is negative other than that mentioned in the history of present illness.   PHYSICAL EXAM   BP 150/78  Pulse 110  Ht 4\' 10"  (1.473 m)  Wt 143 lb 12.8 oz (65.227 kg)  BMI 30.06 kg/m2 Constitutional: She is oriented to person, place, and time. She appears well-developed and well-nourished. No distress.  HENT: No nasal discharge.  Head: Normocephalic and atraumatic.  Eyes: Pupils are equal and round. No discharge.  Neck: Normal range of motion. Neck supple. No JVD present. No thyromegaly present.  Cardiovascular: Normal rate, regular rhythm, normal heart sounds. Exam reveals no gallop and no friction rub. No murmur heard.  Pulmonary/Chest: Effort normal and breath sounds normal. No stridor. No respiratory distress. She has no wheezes. She has no rales. She exhibits no tenderness.  Abdominal: Soft. Bowel sounds are normal. She exhibits no distension. There is no tenderness. There is no rebound and no guarding.  Musculoskeletal: Normal range of motion. She exhibits mild edema and no tenderness.  Neurological: She is alert and oriented to person, place, and time. Coordination normal.  Skin: Skin is warm and dry. No rash noted. She is not diaphoretic. No erythema. No pallor.  Psychiatric: She has a normal mood and affect. Her behavior is normal. Judgment and thought content normal.  Vascular: Femoral: normal on right and very faint on left. Distal pulses are not palpable.      ASSESSMENT AND  PLAN

## 2014-08-12 ENCOUNTER — Encounter (HOSPITAL_COMMUNITY): Payer: Managed Care, Other (non HMO)

## 2014-08-13 DIAGNOSIS — I25810 Atherosclerosis of coronary artery bypass graft(s) without angina pectoris: Secondary | ICD-10-CM | POA: Insufficient documentation

## 2014-08-13 NOTE — Assessment & Plan Note (Signed)
The patient has mild left leg claudication due to stenosis involving the proximal left common femoral artery. Given that her symptoms are currently not lifestyle limiting, I recommend continuing medical therapy.

## 2014-08-13 NOTE — Assessment & Plan Note (Signed)
Due to recent acute renal failure after the initiation of an ACE inhibitor, I think it's important to exclude bilaterally renal artery stenosis. Thus, I requested renal artery duplex ultrasound.

## 2014-08-13 NOTE — Assessment & Plan Note (Addendum)
Recent catheterization showed occluded SVG to OM. She has chronic exertional dyspnea without chest discomfort. She appears to be stable at the present time. Continue medical therapy. I increased the dose of carvedilol to 12.5 mg twice daily. Avoid ACE inhibitors for now.

## 2014-08-13 NOTE — Assessment & Plan Note (Signed)
This was only mild to moderate after optimizing volume status.

## 2014-08-14 ENCOUNTER — Encounter: Payer: Self-pay | Admitting: Interventional Cardiology

## 2014-08-18 ENCOUNTER — Ambulatory Visit: Payer: Managed Care, Other (non HMO) | Admitting: Cardiovascular Disease

## 2014-08-25 ENCOUNTER — Encounter (HOSPITAL_COMMUNITY): Payer: Managed Care, Other (non HMO)

## 2014-08-27 ENCOUNTER — Ambulatory Visit: Payer: Managed Care, Other (non HMO) | Admitting: Interventional Cardiology

## 2014-09-08 ENCOUNTER — Ambulatory Visit: Payer: Managed Care, Other (non HMO) | Admitting: Endocrinology

## 2014-09-10 ENCOUNTER — Encounter: Payer: Self-pay | Admitting: Interventional Cardiology

## 2014-09-11 ENCOUNTER — Encounter: Payer: Self-pay | Admitting: Endocrinology

## 2014-09-11 ENCOUNTER — Ambulatory Visit (INDEPENDENT_AMBULATORY_CARE_PROVIDER_SITE_OTHER): Payer: Managed Care, Other (non HMO) | Admitting: Endocrinology

## 2014-09-11 VITALS — BP 113/76 | HR 100 | Temp 98.7°F | Resp 14 | Ht <= 58 in | Wt 139.0 lb

## 2014-09-11 DIAGNOSIS — E038 Other specified hypothyroidism: Secondary | ICD-10-CM

## 2014-09-11 DIAGNOSIS — E1149 Type 2 diabetes mellitus with other diabetic neurological complication: Secondary | ICD-10-CM

## 2014-09-11 DIAGNOSIS — E1142 Type 2 diabetes mellitus with diabetic polyneuropathy: Secondary | ICD-10-CM

## 2014-09-11 DIAGNOSIS — D638 Anemia in other chronic diseases classified elsewhere: Secondary | ICD-10-CM | POA: Diagnosis not present

## 2014-09-11 DIAGNOSIS — E063 Autoimmune thyroiditis: Secondary | ICD-10-CM

## 2014-09-11 DIAGNOSIS — G629 Polyneuropathy, unspecified: Secondary | ICD-10-CM

## 2014-09-11 DIAGNOSIS — E785 Hyperlipidemia, unspecified: Secondary | ICD-10-CM

## 2014-09-11 LAB — CBC WITH DIFFERENTIAL/PLATELET
BASOS PCT: 0.3 % (ref 0.0–3.0)
Basophils Absolute: 0 10*3/uL (ref 0.0–0.1)
Eosinophils Absolute: 0.2 10*3/uL (ref 0.0–0.7)
Eosinophils Relative: 3.1 % (ref 0.0–5.0)
HCT: 28.8 % — ABNORMAL LOW (ref 36.0–46.0)
HEMOGLOBIN: 9.4 g/dL — AB (ref 12.0–15.0)
Lymphocytes Relative: 17 % (ref 12.0–46.0)
Lymphs Abs: 1.2 10*3/uL (ref 0.7–4.0)
MCHC: 32.5 g/dL (ref 30.0–36.0)
MCV: 90.3 fl (ref 78.0–100.0)
MONO ABS: 0.6 10*3/uL (ref 0.1–1.0)
Monocytes Relative: 9 % (ref 3.0–12.0)
NEUTROS ABS: 4.9 10*3/uL (ref 1.4–7.7)
Neutrophils Relative %: 70.6 % (ref 43.0–77.0)
Platelets: 160 10*3/uL (ref 150.0–400.0)
RBC: 3.19 Mil/uL — ABNORMAL LOW (ref 3.87–5.11)
RDW: 14.4 % (ref 11.5–15.5)
WBC: 6.9 10*3/uL (ref 4.0–10.5)

## 2014-09-11 LAB — IBC PANEL
Iron: 45 ug/dL (ref 42–145)
Saturation Ratios: 9.7 % — ABNORMAL LOW (ref 20.0–50.0)
Transferrin: 331.2 mg/dL (ref 212.0–360.0)

## 2014-09-11 MED ORDER — GABAPENTIN 100 MG PO CAPS: ORAL_CAPSULE | ORAL | Status: AC

## 2014-09-11 NOTE — Patient Instructions (Signed)
Please check blood sugars at least half the time about 2 hours AFTER any meal and every other day on waking up.  Please bring blood sugar monitor to each visit  Add protein to every meal: May have yogurt, lentils, soybeans, protein biscuits  Glipizide"-metformin: Take half tablet before breakfast and one tablet before dinner  Take B-12 only once a week  Gabapentin for nerve pain in the feet: Take only as needed, to take with food

## 2014-09-11 NOTE — Progress Notes (Signed)
Patient ID: Margaret [name], female   DOB: [birth year], 80 y.o.   MRN: [id number]           Reason for Appointment: Consultation for Type 2 Diabetes  Referring physician:  Leodis Sias  History of Present Illness:          Diagnosis: Type 2 diabetes mellitus, date of diagnosis:        Past history:  She was initially treated with metformin and subsequently given glipizide also   Because of poor control earlier this year she was given  Janumet    She has not had consistent  Control and details of previous evaluation and A1c is are not available.  She thinks that she had fairly good control several months ago with blood sugars in the mornings  About 120-140   Recent history:    She had been taking  Janumet Prior to her hospitalization in  10/15. This  was stopped because of  Renal failure.    Subsequently she has been taking [place] and her PCP restarted her on Metaglip  when her renal function was improved.    She is taking 1 tablet in the morning only     She is using an Accu-Chek monitor but checking blood sugars only in the mornings.  Lab glucose This week was 142 random   She had an A1c done  On 09/08/14 by PCP which was 7.3  She has not had any significant weight change   Recently the patient and family requested a consultation for further management        Oral hypoglycemic drugs the patient is taking are:  Metaglip  5/ 500 daily . Tradgenta     Side effects from medications have been: none Compliance with the medical regimen:  fair Hypoglycemia: none  Glucose monitoring:  done one time a day         Glucometer:  Accu-Chek   Blood Glucose readings by  recall   Fasting  readings recently:165-240   Self-care: The diet that the patient has been following is: tries to limit  High-fat meals and snacks.     Meals: 3 meals per day. Breakfast is Thin slice of [place] bread, usually has small lunch and usually Roti , vegetables and lentils at  [employer] and dinner.  Sometimes will have only  cereal for lunch  She does not have any protein in her diet otherwise  And does not use much dairy products      Exercise:  trying to walk 30  Minutes on  Some days        Dietician visit, most recent: never               Weight history:  Wt Readings from Last 3 Encounters:  09/11/14 139 lb (63.05 kg)  08/11/14 143 lb 12.8 oz (65.227 kg)  08/02/14 146 lb 9.7 oz (66.5 kg)    Glycemic control:    Lab Results  Component Value Date   HGBA1C 6.1* 07/31/2014   Lab Results  Component Value Date   CREATININE 2.09* 08/02/2014    urine microalbumin ratio not available   Last  Creatinine 1.26 done on 09/08/14      Medication List       This list is accurate as of: 09/11/14 11:47 AM.  Always use your most recent med list.               carvedilol 12.5 MG tablet  Commonly known as:  COREG  Take 1 tablet (12.5 mg total) by mouth 2 (two) times daily with a meal.     clopidogrel 75 MG tablet  Commonly known as:  PLAVIX  Take 75 mg by mouth daily.     Cyanocobalamin 2500 MCG Tabs  Take 5,000 mcg by mouth daily.     furosemide 40 MG tablet  Commonly known as:  LASIX  Take 40 mg by mouth every morning.     glipiZIDE-metformin 5-500 MG per tablet  Commonly known as:  METAGLIP  Take 1 tablet by mouth 2 (two) times daily before a meal.     levothyroxine 125 MCG tablet  Commonly known as:  SYNTHROID, LEVOTHROID  Take 1 tablet (125 mcg total) by mouth daily before breakfast.     rosuvastatin 10 MG tablet  Commonly known as:  CRESTOR  Take 1 tablet (10 mg total) by mouth daily after supper. Rosavel from [place]     TRADJENTA 5 MG Tabs tablet  Generic drug:  linagliptin  Take 5 mg by mouth every morning.        Allergies:  Allergies  Allergen Reactions  . Myoview [Technetium-45m] Shortness Of Breath  . Penicillins Other (See Comments)    Unknown allergic reaction     Past Medical History  Diagnosis Date  . CAD S/P percutaneous coronary angioplasty 2005    prior  LAD PCI - in 2005, LAD stent 100% occluded (mid LAD).  Marland Kitchen Left main coronary artery disease 03/2010    Ostial left main 60, OM1 70, circumflex 50-60, mid LAD stent occluded, mid RCA 30-40, RV marginal 90, EF 45-50% --> CABG - (Bartle) LIMA-LAD, SVG-D1, sSVG-OM1-OM2,  . Chronic diastolic CHF (congestive heart failure), NYHA class 2     Grade 2 Diastolic Dysfunction on Echo (hospitalized in [place] 01/2014 with CHF, no PTCA performed)  . Hx of echocardiogram 05/2014    EF 50-55%, inf-lat HK, Gr 2 DD, very mild AS (mean 10 mmHg), mild MR, PASP 36 mmHg; calcified density in LA (suggest TEE)  . Atherosclerotic peripheral vascular disease with intermittent claudication 04/2014    LEA Dopplers: RABI 1.02/ LABI: 0.61 (L SFA, Pop & DP blunted wave forms) -- correlates with L leg claudication  . Type 2 diabetes mellitus   . Hyperlipidemia with target LDL less than 70   . Vitamin B12 deficiency   . Thyroid disease   . RESPIRATORY ARREST assoc w MYOVIEW     Past Surgical History  Procedure Laterality Date  . Total knee arthroplasty    . Cardiac catheterization  2005    LAD stent occluded  . Cardiac catheterization  03/2010     Ostial left main 60, OM1 70, circumflex 50-60, mid LAD stent occluded, mid RCA 30-40, RV marginal 90, EF 45-50%. >>> CABG  . Coronary artery bypass graft  03/2010    LIMA-LAD, SVG-D1, seqSVG-OM1-OM3    Family History  Problem Relation Age of Onset  . Ulcers    . Diabetes    . Arthritis    . Heart attack Neg Hx   . Diabetes    . Diabetes Father     Social History:  reports that she has never smoked. She does not have any smokeless tobacco history on file. She reports that she does not drink alcohol or use illicit drugs.    Review of Systems       Vision is normal. Most recent eye exam was  2 or 3 years ago in [place] , not clear if she  had any  Problems detected       Lipids:  LDL on 09/08/14 was 38 with HDL 44 9 triglycerides 85.  She has been on  Crestor      No results  found for: CHOL, HDL, LDLCALC, LDLDIRECT, TRIG, CHOLHDL                Skin: No rash or infections     Thyroid:   She does complain of getting tired easily.  She has been on levothyroxine for several years  Lab Results  Component Value Date   TSH 2.730 07/17/2014        The blood pressure has been normal without medications     No swelling of feet.     No shortness of breath or chest tightness  on exertion.      She has a history of coronary artery disease treated by bypass      No history of calf pain on walking     Bowel habits: Normal. No abdominal pain      No joint  pains.            She complains of some Numbness  As well as tingling  in feet  And has not been treated for this,  Is interested in medication to relieve her symptoms   Hemoglobin was significantly low  During her hospitalization in 10/15 but has not had any follow-up on evaluation     Physical Examination:  BP 113/76 mmHg  Pulse 100  Temp(Src) 98.7 F (37.1 C)  Resp 14  Ht 4\' 10"  (1.473 m)  Wt 139 lb (63.05 kg)  BMI 29.06 kg/m2  SpO2 95%  GENERAL:   She is averagely built and nourished,  Looks well, no acute distress. Pleasant and cooperative and communicates well   HEENT:         Eye exam shows normal external appearance. Fundus exam shows no retinopathy. Oral exam shows normal mucosa .  NECK:         General:  Neck exam shows no lymphadenopathy. Carotids are normal to palpation and no bruit heard.   Thyroid is not enlarged and no nodules felt.   LUNGS:         Chest is symmetrical. Lungs are clear to auscultation.Marland Kitchen   HEART:         Heart sounds:  S1 and S2 are normal. No murmurs or clicks heard., no S3 or S4.   ABDOMEN:   There is no distention present. Liver and spleen are not palpable. No other mass or tenderness present.  EXTREMITIES:     There is no edema. No skin lesions present.Marland Kitchen  NEUROLOGICAL:   Vibration sense is  absent in toes  distally. Ankle jerks are absent bilaterally.            Monofilament sensation decreased to absent distally  On both sides especially  Smaller toes.  pedal pulses absent on the left MUSCULOSKELETAL:       There is no enlargement or deformity of the joints. Spine is normal to inspection.Marland Kitchen   SKIN:       No rash or lesions of concern.        ASSESSMENT:  Diabetes type 2, uncontrolled  With only mild obesity and recent A1c of 7.3   She has been on oral hypoglycemic drugs  Including glipizide, metformin and Tradgenta  Her  detailed history is difficult to assess because of lack of records , see history of present  illness for available information on her medication history.  She has been on and off metformin recently because of renal dysfunction  Currently taking only low doses of  500 mg metformin and 5 mg glipizide   She appears to have relatively high fasting glucose but not clear if she has high readings after her evening meal ; checking only fasting readings  Her diet indicates  Relatively high carbohydrate content and without much protein at most meals.  She is vegetarian  Complications:  Peripheral neuropathy with sensory loss, unknown status of retinopathy or nephropathy   She  has had coronary artery disease  Requiring CABG   Hyperlipidemia has been treated with Crestor with history of CAD and LDL is below 70 along with normal HDL and triglycerides  No history of hypertension    By history has had primary hypothyroidism which appears to be adequately replaced  As of 9/15   Severe anemia with last hemoglobin 8.4. No recent evaluation or follow-up done Not clear why patient is on high-dose vitamin B-12 and since her level recently is above the upper limit have asked her to take B-12 only once a week  PLAN:    Discussed meal planning especially with adding protein to  Every  Meal  And discussed potential sources of  Vegetarian protein.   encouraged her to continue walking as much as tolerated   Since she has only a small meal at breakfast  and lunch she can take only half  Metaglip in the morning  And start 1 tablet before dinner.  This will also help her fasting hyperglycemia   Discussed needing to check more blood sugars after meals by rotation and bring the monitor for download on the next visit   will need to check urine microalbumin and assess need for ACE inhibitor.   Reminded her to schedule follow-up with  Ophthalmologist for eye exams , given brochure on importance of preventing retinopathy with routine screening    reevaluate CBC and iron level for anemia today  Trial of gabapentin low-dose as needed for symptoms of neuropathy.  Discussed foot care with presence of sensory loss  Follow-up in 3 weeks  Counseling time over 50% of today's 60 minute visit   Allan Bacigalupi 09/11/2014, 11:47 AM   Note: This office note was prepared with [employer] underwriterDragon voice recognition system technology. Any transcriptional errors that result from this process are unintentional.

## 2014-09-13 DIAGNOSIS — E1142 Type 2 diabetes mellitus with diabetic polyneuropathy: Secondary | ICD-10-CM | POA: Insufficient documentation

## 2014-09-13 NOTE — Progress Notes (Signed)
Quick Note:  Please let her son know that she has significant anemia and low iron, start OTC ferrous sulfate 325 mg daily, send copy to PCP ______

## 2014-10-01 ENCOUNTER — Encounter (HOSPITAL_COMMUNITY): Payer: Self-pay | Admitting: Cardiovascular Disease

## 2014-10-08 ENCOUNTER — Encounter: Payer: Self-pay | Admitting: *Deleted

## 2014-10-08 ENCOUNTER — Telehealth: Payer: Self-pay | Admitting: Endocrinology

## 2014-10-08 ENCOUNTER — Ambulatory Visit: Payer: Managed Care, Other (non HMO) | Admitting: Endocrinology

## 2014-10-08 NOTE — Telephone Encounter (Signed)
Patient no showed today's appt. Please advise on how to follow up. °A. No follow up necessary. °B. Follow up urgent. Contact patient immediately. °C. Follow up necessary. Contact patient and schedule visit in ___ days. °D. Follow up advised. Contact patient and schedule visit in ____weeks. ° °

## 2014-10-08 NOTE — Telephone Encounter (Signed)
Letter mailed

## 2017-04-12 ENCOUNTER — Encounter: Payer: Self-pay | Admitting: Physician Assistant

## 2017-04-17 ENCOUNTER — Encounter: Payer: Self-pay | Admitting: Physician Assistant

## 2017-04-17 NOTE — Progress Notes (Deleted)
Cardiology Office Note    Date:  04/17/2017  ID:  Margaret [name], Margaret [name] [birth year], MRN [id number] PCP:  Ileana Ladd, MD  Cardiologist:  Eldridge Dace, [place] Kirke Corin   Chief Complaint: re-establish care for CAD and CHF  History of Present Illness:  Margaret [name] is a 83 y.o. female with history of CAD status post CABG in 2011, diabetes, HL, lower exremity PAD, chronic diastolic CHF, ischemic mitral regurg 2015, moderate TR, mod pulm HTN, CKD stage III, vit D deficiency, thyroid disease (details unclear, not on meds), anemia back in 2015 who presents to re-establish care.  Last seen by Dr. Kirke Corin in 2015 for PAD. He reports she had been admitted to hospital in [place] in May of that year with CHF and renal failure. She was admitted 09/25-10/02/15 after she held her Lasix and BB for a Myoview. She had a Lexiscan injection, developed diarrhea, acute CHF requiring BiPAP and was hypoxic. She was diuresed and then a R/L heart cath was performed. She had ischemic MR due to occlusion of SVG-OM, medical therapy recommended - otherwise moderate mid RCA stenosis not grafted, patent LIMA-LAD and SVG-diagonal. TEE showed mild-mod MR, mod TR and mod pulm HTN, but no ruptured chordae - EF 50-55% with no RMWA. She was started on an ACE inhibitor. She went to her family MD and blood work showed significantly worsened renal function. She was taken off Lasix and metformin. When her renal function did not improve, she was instructed to come to the ER, and was admitted 10/09. Renal function improved gradually and was discharged home. She saw Dr. Kirke Corin 07/2014 at which time carvedilol was increased and med rx recommended for PAD. Renal artery duplex and follow-up were arranged but never performed. Last Epic labs 08/2014 include Hgb 9.4, K 4.3, Cr 1.26, Na 138, A1C 7.3, LDL 38. Labs sent form PCP 04/11/2017: Tchol 100, trig 91, LDL 46, HDL 35, A1C 6.4, TSH 2.09, BNP 567, BUN 35, Cr 1.45, Na 142, K 5.3, LFTs wnl, Hgb 9.6   Chronic diastolic  CHF CAD CKD stage III Valvular heart disease Anemia Pulm HTN     Past Medical History:  Diagnosis Date  . Anemia   . Atherosclerotic peripheral vascular disease with intermittent claudication (HCC) 04/2014   LEA Dopplers: RABI 1.02/ LABI: 0.61 (L SFA, Pop & DP blunted wave forms) -- correlates with L leg claudication  . CAD (coronary artery disease)    a. prior PCI LAD 2005. b. CABG 2011.  Marland Kitchen Chronic diastolic CHF (congestive heart failure) (HCC)   . CKD (chronic kidney disease), stage III   . Hx of echocardiogram 05/2014   EF 50-55%, inf-lat HK, Gr 2 DD, very mild AS (mean 10 mmHg), mild MR, PASP 36 mmHg; calcified density in LA (suggest TEE)  . Hyperlipidemia with target LDL less than 70   . Mitral regurgitation   . PAD (peripheral artery disease) (HCC)   . Pulmonary hypertension (HCC)   . RESPIRATORY ARREST assoc w MYOVIEW   . Thyroid disease   . Tricuspid regurgitation   . Type 2 diabetes mellitus (HCC)   . Vitamin B12 deficiency     Past Surgical History:  Procedure Laterality Date  . CARDIAC CATHETERIZATION  2005   LAD stent occluded  . CARDIAC CATHETERIZATION  03/2010    Ostial left main 60, OM1 70, circumflex 50-60, mid LAD stent occluded, mid RCA 30-40, RV marginal 90, EF 45-50%. >>> CABG  . CORONARY ARTERY BYPASS GRAFT  03/2010  LIMA-LAD, SVG-D1, seqSVG-OM1-OM3  . LEFT HEART CATHETERIZATION WITH CORONARY/GRAFT ANGIOGRAM N/A 07/22/2014   Procedure: LEFT HEART CATHETERIZATION WITH Isabel Caprice;  Surgeon: Iran Ouch, MD;  Location: [facility]ATH [facility];  Service: Cardiovascular;  Laterality: N/A;  . TOTAL KNEE ARTHROPLASTY      Current Medications: No outpatient prescriptions have been marked as taking for the 04/18/17 encounter (Appointment) with Laurann Montana, PA-C.     Allergies:   Myoview [technetium-29m] and Penicillins   Social History   Social History  . Marital status: Single    Spouse name: N/A  . Number of children: N/A  . Years of  education: N/A   Occupational History  . Retired    Social History Main Topics  . Smoking status: Never Smoker  . Smokeless tobacco: Never Used  . Alcohol use No  . Drug use: No  . Sexual activity: Not on file   Other Topics Concern  . Not on file   Social History Narrative   Son is POA.   Does not smoke - never smoked.   Spends 1/2 year in [place] & 1/2 year in [place].     Family History:  Family History  Problem Relation Age of Onset  . Diabetes Father   . Ulcers Unknown   . Diabetes Unknown   . Arthritis Unknown   . Diabetes Unknown   . Heart attack Neg Hx    ***  ROS:   Please see the history of present illness. Otherwise, review of systems is positive for ***.  All other systems are reviewed and otherwise negative.    PHYSICAL EXAM:   VS:  There were no vitals taken for this visit.  BMI: There is no height or weight on file to calculate BMI. GEN: Well nourished, well developed, in no acute distress  HEENT: normocephalic, atraumatic Neck: no JVD, carotid bruits, or masses Cardiac: ***RRR; no murmurs, rubs, or gallops, no edema  Respiratory:  clear to auscultation bilaterally, normal work of breathing GI: soft, nontender, nondistended, + BS MS: no deformity or atrophy  Skin: warm and dry, no rash Neuro:  Alert and Oriented x 3, Strength and sensation are intact, follows commands Psych: euthymic mood, full affect  Wt Readings from Last 3 Encounters:  09/11/14 139 lb (63 kg)  08/11/14 143 lb 12.8 oz (65.2 kg)  08/02/14 146 lb 9.7 oz (66.5 kg)      Studies/Labs Reviewed:   EKG:  EKG was ordered today and personally reviewed by me and demonstrates *** EKG was not ordered today.***  Recent Labs: No results found for requested labs within last 8760 hours.   Lipid Panel No results found for: CHOL, TRIG, HDL, CHOLHDL, VLDL, LDLCALC, LDLDIRECT  Additional studies/ records that were reviewed today include: Summarized above.***    ASSESSMENT & PLAN:    1. ***  Disposition: F/u with ***   Medication Adjustments/Labs and Tests Ordered: Current medicines are reviewed at length with the patient today.  Concerns regarding medicines are outlined above. Medication changes, Labs and Tests ordered today are summarized above and listed in the Patient Instructions accessible in Encounters.   Signed, Laurann Montana, PA-C  04/17/2017 11:24 AM    [facility] [address] [place], [place], [place]  [address] Phone: [phone]; Fax: [phone]

## 2017-04-18 ENCOUNTER — Ambulatory Visit: Payer: Managed Care, Other (non HMO) | Admitting: Physician Assistant

## 2017-04-18 ENCOUNTER — Encounter (INDEPENDENT_AMBULATORY_CARE_PROVIDER_SITE_OTHER): Payer: Self-pay

## 2017-04-20 ENCOUNTER — Ambulatory Visit: Payer: Managed Care, Other (non HMO) | Admitting: Physician Assistant

## 2017-05-01 ENCOUNTER — Ambulatory Visit: Payer: Managed Care, Other (non HMO) | Admitting: Cardiology

## 2017-05-07 ENCOUNTER — Ambulatory Visit: Payer: Managed Care, Other (non HMO) | Admitting: Cardiology

## 2017-07-05 ENCOUNTER — Other Ambulatory Visit: Payer: Self-pay | Admitting: Orthopedic Surgery

## 2017-07-05 DIAGNOSIS — M5416 Radiculopathy, lumbar region: Secondary | ICD-10-CM

## 2017-07-06 ENCOUNTER — Other Ambulatory Visit: Payer: Managed Care, Other (non HMO)

## 2019-06-23 ENCOUNTER — Emergency Department (HOSPITAL_COMMUNITY): Payer: Medicare Other

## 2019-06-23 ENCOUNTER — Inpatient Hospital Stay (HOSPITAL_COMMUNITY)
Admission: EM | Admit: 2019-06-23 | Discharge: 2019-06-30 | DRG: 872 | Disposition: A | Discharge status: Home Health | Payer: Medicare Other | Attending: Internal Medicine | Admitting: Internal Medicine

## 2019-06-23 ENCOUNTER — Encounter (HOSPITAL_COMMUNITY): Payer: Self-pay | Admitting: *Deleted

## 2019-06-23 DIAGNOSIS — B9620 Unspecified Escherichia coli [E. coli] as the cause of diseases classified elsewhere: Secondary | ICD-10-CM | POA: Diagnosis present

## 2019-06-23 DIAGNOSIS — I25810 Atherosclerosis of coronary artery bypass graft(s) without angina pectoris: Secondary | ICD-10-CM | POA: Diagnosis present

## 2019-06-23 DIAGNOSIS — Z96659 Presence of unspecified artificial knee joint: Secondary | ICD-10-CM | POA: Diagnosis present

## 2019-06-23 DIAGNOSIS — Z951 Presence of aortocoronary bypass graft: Secondary | ICD-10-CM

## 2019-06-23 DIAGNOSIS — I5042 Chronic combined systolic (congestive) and diastolic (congestive) heart failure: Secondary | ICD-10-CM | POA: Diagnosis present

## 2019-06-23 DIAGNOSIS — E860 Dehydration: Secondary | ICD-10-CM | POA: Diagnosis present

## 2019-06-23 DIAGNOSIS — K8000 Calculus of gallbladder with acute cholecystitis without obstruction: Secondary | ICD-10-CM | POA: Diagnosis present

## 2019-06-23 DIAGNOSIS — I5030 Unspecified diastolic (congestive) heart failure: Secondary | ICD-10-CM | POA: Diagnosis present

## 2019-06-23 DIAGNOSIS — A419 Sepsis, unspecified organism: Principal | ICD-10-CM | POA: Diagnosis present

## 2019-06-23 DIAGNOSIS — I272 Pulmonary hypertension, unspecified: Secondary | ICD-10-CM | POA: Diagnosis present

## 2019-06-23 DIAGNOSIS — Z7902 Long term (current) use of antithrombotics/antiplatelets: Secondary | ICD-10-CM

## 2019-06-23 DIAGNOSIS — E871 Hypo-osmolality and hyponatremia: Secondary | ICD-10-CM | POA: Diagnosis present

## 2019-06-23 DIAGNOSIS — R109 Unspecified abdominal pain: Secondary | ICD-10-CM | POA: Diagnosis present

## 2019-06-23 DIAGNOSIS — E538 Deficiency of other specified B group vitamins: Secondary | ICD-10-CM | POA: Diagnosis present

## 2019-06-23 DIAGNOSIS — Z79890 Hormone replacement therapy (postmenopausal): Secondary | ICD-10-CM

## 2019-06-23 DIAGNOSIS — Z888 Allergy status to other drugs, medicaments and biological substances status: Secondary | ICD-10-CM

## 2019-06-23 DIAGNOSIS — I130 Hypertensive heart and chronic kidney disease with heart failure and stage 1 through stage 4 chronic kidney disease, or unspecified chronic kidney disease: Secondary | ICD-10-CM | POA: Diagnosis present

## 2019-06-23 DIAGNOSIS — K810 Acute cholecystitis: Secondary | ICD-10-CM

## 2019-06-23 DIAGNOSIS — N183 Chronic kidney disease, stage 3 unspecified: Secondary | ICD-10-CM | POA: Diagnosis present

## 2019-06-23 DIAGNOSIS — I083 Combined rheumatic disorders of mitral, aortic and tricuspid valves: Secondary | ICD-10-CM | POA: Diagnosis present

## 2019-06-23 DIAGNOSIS — Z8249 Family history of ischemic heart disease and other diseases of the circulatory system: Secondary | ICD-10-CM

## 2019-06-23 DIAGNOSIS — I248 Other forms of acute ischemic heart disease: Secondary | ICD-10-CM | POA: Diagnosis present

## 2019-06-23 DIAGNOSIS — I5032 Chronic diastolic (congestive) heart failure: Secondary | ICD-10-CM | POA: Diagnosis present

## 2019-06-23 DIAGNOSIS — Z1619 Resistance to other specified beta lactam antibiotics: Secondary | ICD-10-CM | POA: Diagnosis present

## 2019-06-23 DIAGNOSIS — N390 Urinary tract infection, site not specified: Secondary | ICD-10-CM | POA: Diagnosis present

## 2019-06-23 DIAGNOSIS — E861 Hypovolemia: Secondary | ICD-10-CM | POA: Diagnosis present

## 2019-06-23 DIAGNOSIS — Z79899 Other long term (current) drug therapy: Secondary | ICD-10-CM

## 2019-06-23 DIAGNOSIS — E1129 Type 2 diabetes mellitus with other diabetic kidney complication: Secondary | ICD-10-CM | POA: Diagnosis present

## 2019-06-23 DIAGNOSIS — E785 Hyperlipidemia, unspecified: Secondary | ICD-10-CM | POA: Diagnosis present

## 2019-06-23 DIAGNOSIS — R1011 Right upper quadrant pain: Secondary | ICD-10-CM

## 2019-06-23 DIAGNOSIS — E1122 Type 2 diabetes mellitus with diabetic chronic kidney disease: Secondary | ICD-10-CM | POA: Diagnosis present

## 2019-06-23 DIAGNOSIS — E1151 Type 2 diabetes mellitus with diabetic peripheral angiopathy without gangrene: Secondary | ICD-10-CM | POA: Diagnosis present

## 2019-06-23 DIAGNOSIS — R197 Diarrhea, unspecified: Secondary | ICD-10-CM | POA: Diagnosis not present

## 2019-06-23 DIAGNOSIS — R000 Tachycardia, unspecified: Secondary | ICD-10-CM

## 2019-06-23 DIAGNOSIS — E039 Hypothyroidism, unspecified: Secondary | ICD-10-CM | POA: Diagnosis present

## 2019-06-23 DIAGNOSIS — Z833 Family history of diabetes mellitus: Secondary | ICD-10-CM

## 2019-06-23 DIAGNOSIS — Z20828 Contact with and (suspected) exposure to other viral communicable diseases: Secondary | ICD-10-CM | POA: Diagnosis present

## 2019-06-23 DIAGNOSIS — I491 Atrial premature depolarization: Secondary | ICD-10-CM | POA: Diagnosis present

## 2019-06-23 DIAGNOSIS — Z880 Allergy status to penicillin: Secondary | ICD-10-CM

## 2019-06-23 DIAGNOSIS — I2510 Atherosclerotic heart disease of native coronary artery without angina pectoris: Secondary | ICD-10-CM | POA: Diagnosis present

## 2019-06-23 DIAGNOSIS — N3000 Acute cystitis without hematuria: Secondary | ICD-10-CM

## 2019-06-23 DIAGNOSIS — I739 Peripheral vascular disease, unspecified: Secondary | ICD-10-CM

## 2019-06-23 LAB — URINALYSIS, ROUTINE W REFLEX MICROSCOPIC
Bilirubin Urine: NEGATIVE
Glucose, UA: 150 mg/dL — AB
Ketones, ur: 5 mg/dL — AB
Nitrite: NEGATIVE
Protein, ur: 100 mg/dL — AB
Specific Gravity, Urine: 1.017 (ref 1.005–1.030)
WBC, UA: >50 WBC/hpf — ABNORMAL HIGH (ref 0–5)
pH: 5 (ref 5.0–8.0)

## 2019-06-23 LAB — CBC
HCT: 37.3 % (ref 36.0–46.0)
Hemoglobin: 11.9 g/dL — ABNORMAL LOW (ref 12.0–15.0)
MCH: 30.3 pg (ref 26.0–34.0)
MCHC: 31.9 g/dL (ref 30.0–36.0)
MCV: 94.9 fL (ref 80.0–100.0)
Platelets: 162 10*3/uL (ref 150–400)
RBC: 3.93 MIL/uL (ref 3.87–5.11)
RDW: 13.4 % (ref 11.5–15.5)
WBC: 15.7 10*3/uL — ABNORMAL HIGH (ref 4.0–10.5)
nRBC: 0 % (ref 0.0–0.2)

## 2019-06-23 LAB — COMPREHENSIVE METABOLIC PANEL
ALT: 16 U/L (ref 0–44)
AST: 19 U/L (ref 15–41)
Albumin: 3.2 g/dL — ABNORMAL LOW (ref 3.5–5.0)
Alkaline Phosphatase: 59 U/L (ref 38–126)
Anion gap: 12 (ref 5–15)
BUN: 22 mg/dL (ref 8–23)
CO2: 24 mmol/L (ref 22–32)
Calcium: 8.9 mg/dL (ref 8.9–10.3)
Chloride: 92 mmol/L — ABNORMAL LOW (ref 98–111)
Creatinine, Ser: 1.36 mg/dL — ABNORMAL HIGH (ref 0.44–1.00)
GFR calc Af Amer: 42 mL/min — ABNORMAL LOW (ref >60)
GFR calc non Af Amer: 36 mL/min — ABNORMAL LOW (ref >60)
Glucose, Bld: 302 mg/dL — ABNORMAL HIGH (ref 70–99)
Potassium: 4 mmol/L (ref 3.5–5.1)
Sodium: 128 mmol/L — ABNORMAL LOW (ref 135–145)
Total Bilirubin: 1.1 mg/dL (ref 0.3–1.2)
Total Protein: 7.3 g/dL (ref 6.5–8.1)

## 2019-06-23 LAB — LIPASE, BLOOD: Lipase: 25 U/L (ref 11–51)

## 2019-06-23 MED ORDER — SODIUM CHLORIDE 0.9% FLUSH: 3.0000 mL | Freq: Once | INTRAVENOUS | Status: DC

## 2019-06-23 MED ORDER — FENTANYL CITRATE (PF) 100 MCG/2ML IJ SOLN
50.0000 ug | Freq: Once | INTRAMUSCULAR | Status: AC
  Administered 2019-06-23: 50 ug via INTRAVENOUS
  Filled 2019-06-23: qty 2

## 2019-06-23 MED ORDER — ONDANSETRON HCL 4 MG/2ML IJ SOLN
4.0000 mg | Freq: Once | INTRAMUSCULAR | Status: AC
  Administered 2019-06-23: 4 mg via INTRAVENOUS
  Filled 2019-06-23: qty 2

## 2019-06-23 MED ORDER — SODIUM CHLORIDE 0.9 % IV SOLN
1.0000 g | Freq: Once | INTRAVENOUS | Status: AC
  Administered 2019-06-24: 1 g via INTRAVENOUS
  Filled 2019-06-23: qty 10

## 2019-06-23 NOTE — ED Provider Notes (Addendum)
[facility] Provider Note   CSN: [id number] Arrival date & time: 06/23/19  1621     History   Chief Complaint Chief Complaint  Patient presents with  . Abdominal Pain    HPI Margaret [name] is a 85 y.o. female.     The history is provided by the patient and medical records.  Abdominal Pain Associated symptoms: nausea     85 year old female with history of anemia, coronary artery disease status post bypass, CHF, chronic kidney disease, mitral regurgitation, pulmonary hypertension, thyroid disease, diabetes, presenting to the ED with abdominal pain.  History is provided by patient's daughter as she speaks [place].  Daughter reports for the past several days patient has been having epigastric pain with some mild nausea.  States she has not really wanted to eat or drink.  Pain does not necessarily seem worse with eating, she just has no appetite.  She has not had any vomiting or diarrhea.  She has had a little bit of belching.  Daughter reports she has had episodes like this in the past, they have usually been short-lived and resolved within a few hours but this episode seems to be lingering more than normal.  She does report decreased urination at home, thought this was due to her decreased oral intake and use of Lasix at baseline.  She has not had any burning or discomfort with urination.  She has not had any cough, nasal congestion, sore throat, or other respiratory symptoms.  She denies any chest pain or shortness of breath.  Per daughter, they were seen at urgent care earlier today and found to have a temperature of 103F.  She was given tylenol which seemed to improve, temp on arrival was 34F.  Denies known fevers at home.   Past Medical History:  Diagnosis Date  . Acute on chronic renal failure (HCC) 07/31/2014  . Acute pulmonary edema with congestive heart failure (HCC) 07/17/2014  . Acute renal insufficiency 07/19/2014  . Anemia   . ARF (acute  renal failure) (HCC) 07/31/2014  . Atherosclerotic peripheral vascular disease with intermittent claudication (HCC) 04/2014   LEA Dopplers: RABI 1.02/ LABI: 0.61 (L SFA, Pop & DP blunted wave forms) -- correlates with L leg claudication  . Atrophic vaginitis 04/2013  . CAD (coronary artery disease)    a. prior PCI LAD 2005. b. CABG 2011.  Marland Kitchen. Chronic diastolic CHF (congestive heart failure) (HCC)   . CKD (chronic kidney disease), stage III (HCC)   . Hx of CABG   . Hx of echocardiogram 05/2014   EF 50-55%, inf-lat HK, Gr 2 DD, very mild AS (mean 10 mmHg), mild MR, PASP 36 mmHg; calcified density in LA (suggest TEE)  . Hyperlipidemia with target LDL less than 70   . Mitral regurgitation   . PAD (peripheral artery disease) (HCC)   . Pulmonary hypertension (HCC)   . RESPIRATORY ARREST assoc w MYOVIEW   . Thyroid disease   . Tricuspid regurgitation   . Type 2 diabetes mellitus (HCC)   . Vitamin B12 deficiency     Patient Active Problem List   Diagnosis Date Noted  . DM type 2 with diabetic peripheral neuropathy (HCC) 09/13/2014  . CAD (coronary artery disease) of artery bypass graft 08/13/2014  . ARF (acute renal failure) (HCC) 07/31/2014  . Acute on chronic renal failure (HCC) 07/31/2014  . Acute renal failure (HCC) 07/31/2014  . Mitral valve regurgitation, ischemic 07/23/2014  . Moderate to severe pulmonary hypertension (HCC)  07/23/2014  . Acute renal insufficiency 07/19/2014  . Acute on chronic diastolic heart failure (HCC) 07/17/2014  . Acute respiratory failure with hypoxia and hypercapnia - secondary to Acute CHF & pulmonary edema 07/17/2014  . Acute pulmonary edema with congestive heart failure (HCC) 07/17/2014    Class: Present on Admission  . Chronic diastolic CHF (congestive heart failure), NYHA class 2 (HCC)   . Hyperlipidemia with target LDL less than 70   . Atherosclerotic peripheral vascular disease with intermittent claudication (HCC) 04/22/2014  . Type 2 diabetes mellitus  (HCC)   . Thyroid disorder   . Vitamin B12 deficiency   . Left main coronary artery disease 03/23/2010    Past Surgical History:  Procedure Laterality Date  . CARDIAC CATHETERIZATION  2005   LAD stent occluded  . CARDIAC CATHETERIZATION  03/2010    Ostial left main 60, OM1 70, circumflex 50-60, mid LAD stent occluded, mid RCA 30-40, RV marginal 90, EF 45-50%. >>> CABG  . CORONARY ARTERY BYPASS GRAFT  03/2010   LIMA-LAD, SVG-D1, seqSVG-OM1-OM3  . LEFT HEART CATHETERIZATION WITH CORONARY/GRAFT ANGIOGRAM N/A 07/22/2014   Procedure: LEFT HEART CATHETERIZATION WITH Isabel Caprice;  Surgeon: Iran Ouch, MD;  Location: [facility] CATH [facility];  Service: Cardiovascular;  Laterality: N/A;  . TOTAL KNEE ARTHROPLASTY       OB History   No obstetric history on file.      Home Medications    Prior to Admission medications   Medication Sig Start Date End Date Taking? Authorizing Provider  carvedilol (COREG) 12.5 MG tablet Take 1 tablet (12.5 mg total) by mouth 2 (two) times daily with a meal. 08/11/14   Iran Ouch, MD  clopidogrel (PLAVIX) 75 MG tablet Take 75 mg by mouth daily.    [provider]  Cyanocobalamin (VITAMIN B-12) 2500 MCG TABS Take 5,000 mcg by mouth daily. 08/02/14   Joseph Art, DO  furosemide (LASIX) 40 MG tablet Take 40 mg by mouth every morning. 08/02/14   Joseph Art, DO  gabapentin (NEURONTIN) 100 MG capsule 100 mg, 3 times a day as needed for nerve pain 09/11/14   Reather Littler, MD  glipiZIDE-metformin (METAGLIP) 5-500 MG per tablet Take 1 tablet by mouth 2 (two) times daily before a meal.    [provider]  levothyroxine (SYNTHROID, LEVOTHROID) 125 MCG tablet Take 1 tablet (125 mcg total) by mouth daily before breakfast. 07/24/14   Barrett, Joline Salt, PA-C  linagliptin (TRADJENTA) 5 MG TABS tablet Take 5 mg by mouth every morning.    [provider]  rosuvastatin (CRESTOR) 10 MG tablet Take 1 tablet (10 mg total) by mouth daily  after supper. [place] from [place] 07/24/14   Barrett, Joline Salt, PA-C    Family History Family History  Problem Relation Age of Onset  . Diabetes Father   . Ulcers Other   . Diabetes Other   . Arthritis Other   . Diabetes Other   . Heart attack Neg Hx     Social History Social History   Tobacco Use  . Smoking status: Never Smoker  . Smokeless tobacco: Never Used  Substance Use Topics  . Alcohol use: No  . Drug use: No     Allergies   Myoview [technetium-62m] and Penicillins   Review of Systems Review of Systems  Gastrointestinal: Positive for abdominal pain and nausea.  All other systems reviewed and are negative.    Physical Exam Updated Vital Signs BP (!) 141/82   Pulse 96  Temp 99 F (37.2 C) (Oral)   Resp 18   Wt 63 kg   SpO2 98%   BMI 29.05 kg/m   Physical Exam Vitals signs and nursing note reviewed.  Constitutional:      Appearance: She is well-developed.  HENT:     Head: Normocephalic and atraumatic.  Eyes:     Conjunctiva/sclera: Conjunctivae normal.     Pupils: Pupils are equal, round, and reactive to light.  Neck:     Musculoskeletal: Normal range of motion.  Cardiovascular:     Rate and Rhythm: Normal rate and regular rhythm.     Heart sounds: Murmur present.  Pulmonary:     Effort: Pulmonary effort is normal.     Breath sounds: Normal breath sounds.  Abdominal:     General: Bowel sounds are normal.     Palpations: Abdomen is soft.     Tenderness: There is abdominal tenderness in the right upper quadrant and epigastric area.     Comments: RUQ > epigastric  Musculoskeletal: Normal range of motion.  Skin:    General: Skin is warm and dry.  Neurological:     Mental Status: She is alert and oriented to person, place, and time.      ED Treatments / Results  Labs (all labs ordered are listed, but only abnormal results are displayed) Labs Reviewed  COMPREHENSIVE METABOLIC PANEL - Abnormal; Notable for the following components:       Result Value   Sodium 128 (*)    Chloride 92 (*)    Glucose, Bld 302 (*)    Creatinine, Ser 1.36 (*)    Albumin 3.2 (*)    GFR calc non Af Amer 36 (*)    GFR calc Af Amer 42 (*)    All other components within normal limits  CBC - Abnormal; Notable for the following components:   WBC 15.7 (*)    Hemoglobin 11.9 (*)    All other components within normal limits  URINALYSIS, ROUTINE W REFLEX MICROSCOPIC - Abnormal; Notable for the following components:   Color, Urine AMBER (*)    APPearance TURBID (*)    Glucose, UA 150 (*)    Hgb urine dipstick SMALL (*)    Ketones, ur 5 (*)    Protein, ur 100 (*)    Leukocytes,Ua LARGE (*)    WBC, UA >50 (*)    Bacteria, UA MANY (*)    All other components within normal limits  URINE CULTURE  CULTURE, BLOOD (ROUTINE X 2)  CULTURE, BLOOD (ROUTINE X 2)  SARS CORONAVIRUS 2 (HOSPITAL ORDER, [facility])  LIPASE, BLOOD  LACTIC ACID, PLASMA    EKG EKG Interpretation  Date/Time:  Monday June 23 2019 22:32:29 EDT Ventricular Rate:  90 PR Interval:    QRS Duration: 91 QT Interval:  355 QTC Calculation: 435 R Axis:   -27 Text Interpretation:  Sinus rhythm Supraventricular bigeminy Borderline left axis deviation Probable anterior infarct, age indeterminate Artifact Abnormal ekg Confirmed by Gerhard MunchLockwood, Robert [phone]([id number]) on 06/23/2019 10:48:39 PM   Radiology Dg Chest Port 1 View  Result Date: 06/23/2019 CLINICAL DATA:  Initial evaluation for acute fever, epigastric pain. EXAM: PORTABLE CHEST 1 VIEW COMPARISON:  Prior radiograph from 07/31/2014. FINDINGS: Median sternotomy wires underlying surgical clips and CABG markers noted. Cardiomegaly, stable. Mediastinal silhouette normal. Aortic atherosclerosis. Lungs mildly hypoinflated. Perihilar vascular congestion without overt pulmonary edema. No pleural effusion. No consolidative airspace opacity. No pneumothorax. Few scattered calcified granulomata noted. No  acute osseous  abnormality. Soft tissue calcifications overlie the left neck and chest. IMPRESSION: 1. Stable cardiomegaly with mild perihilar vascular congestion without overt pulmonary edema. 2. No other active cardiopulmonary disease. Electronically Signed   By: Rise MuBenjamin  McClintock M.D.   On: 06/23/2019 23:25   [place] Abdomen Limited Ruq  Result Date: 06/24/2019 CLINICAL DATA:  Right upper quadrant pain. EXAM: ULTRASOUND ABDOMEN LIMITED RIGHT UPPER QUADRANT COMPARISON:  None. FINDINGS: Gallbladder: Distended with multiple gallstones. Borderline wall thickening of 3-4 mm. No pericholecystic fluid. No sonographic Murphy sign noted by sonographer. Common bile duct: Diameter: 3-4 mm, normal. Liver: No focal lesion identified. Within normal limits in parenchymal echogenicity. Portal vein is patent on color Doppler imaging with normal direction of blood flow towards the liver. Other: None. IMPRESSION: 1. Gallstones with borderline wall thickening of 3-4 mm. Consider nuclear medicine HIDA scan if there is clinical concern for acute cholecystitis. No sonographic Murphy sign or pericholecystic fluid. 2. No biliary dilatation. Electronically Signed   By: Narda RutherfordMelanie  Sanford M.D.   On: 06/24/2019 01:20    Procedures Procedures (including critical care time)  Medications Ordered in ED Medications  sodium chloride flush (NS) 0.9 % injection 3 mL (has no administration in time range)  metroNIDAZOLE (FLAGYL) IVPB 500 mg (has no administration in time range)  cefTRIAXone (ROCEPHIN) 1 g in sodium chloride 0.9 % 100 mL IVPB (has no administration in time range)  fentaNYL (SUBLIMAZE) injection 50 mcg (50 mcg Intravenous Given 06/23/19 2358)  ondansetron (ZOFRAN) injection 4 mg (4 mg Intravenous Given 06/23/19 2357)  cefTRIAXone (ROCEPHIN) 1 g in sodium chloride 0.9 % 100 mL IVPB (0 g Intravenous Stopped 06/24/19 0244)     Initial Impression / Assessment and Plan / ED Course  I have reviewed the triage vital signs and the nursing notes.   Pertinent labs & imaging results that were available during my care of the patient were reviewed by me and considered in my medical decision making (see chart for details).  85 year old female presenting to the ED with right upper quadrant abdominal pain.  History is provided with the assistance of daughter who is serving as Nurse, learning disabilitytranslator.  Patient reportedly has had some abdominal pain for the past few days, now not wanting to eat or drink.  She was seen in urgent care and had a fever of 103F, given Tylenol and transferred here for further evaluation.  She has a low-grade temp of 99 during my assessment but is overall nontoxic in appearance.  She does have significant tenderness in the right upper quadrant and mildly so in the epigastrium.  She does not have any CVA tenderness.  Labs as above, white blood cell count of 15,000.  UA does appear infectious.  She will be given Rocephin.  Her symptoms are concerning for biliary process.  Given fever, will add on lactic acid, blood and urine cultures.  Plan for right upper quadrant ultrasound and portable chest.  Given fentanyl and Zofran.  Normal lactate.  Cultures pending.  CXR with mild congestion, however currently denies respiratory symptoms.  VS remain stable on RA.  She does not appear fluid overloaded and denies chest pain.  RUQ US is borderline-- gallstones with wall thickening of 3-894mm, consider HIDA scan.  No murphy sign or pericholecystic fluid.  Feel patient would benefit from admission with IV antibiotics, HIDA scan and general surgery evaluation.  She was given additional gram of Rocephin and Flagyl added.  Discussed with Dr. Clyde LundborgNiu-- will admit.  Requested general surgery consultation.  2:43 AM Spoke with general surgery, Dr. Barry Dienes-- team to see in the AM, will follow HIDA results.  Final Clinical Impressions(s) / ED Diagnoses   Final diagnoses:  RUQ pain  Acute cystitis without hematuria    ED Discharge Orders    None       Larene Pickett, PA-C 06/24/19 0305    Larene Pickett, PA-C 06/24/19 0305    Carmin Muskrat, MD 06/24/19 1910

## 2019-06-23 NOTE — ED Notes (Signed)
Xray at the bedside.

## 2019-06-23 NOTE — ED Triage Notes (Signed)
Pt is here with mid upper abdominal pain which began yesterday. Pt has pain in epigastric area which is radiating to back as well as RLQ pain.  Pt was given mylanta without any relief. Pt denies any SOB, no N/V.  Pt has been having lots of belching with this. Translated by daughter as pt speaks gujarati and hindi.

## 2019-06-23 NOTE — ED Notes (Signed)
RN at bedside starting IV and will get blood cultures.

## 2019-06-24 ENCOUNTER — Encounter (HOSPITAL_COMMUNITY): Payer: Self-pay | Admitting: General Surgery

## 2019-06-24 ENCOUNTER — Inpatient Hospital Stay (HOSPITAL_COMMUNITY): Payer: Medicare Other

## 2019-06-24 ENCOUNTER — Emergency Department (HOSPITAL_COMMUNITY): Payer: Medicare Other

## 2019-06-24 DIAGNOSIS — B9620 Unspecified Escherichia coli [E. coli] as the cause of diseases classified elsewhere: Secondary | ICD-10-CM | POA: Diagnosis present

## 2019-06-24 DIAGNOSIS — N390 Urinary tract infection, site not specified: Secondary | ICD-10-CM | POA: Diagnosis present

## 2019-06-24 DIAGNOSIS — I5042 Chronic combined systolic (congestive) and diastolic (congestive) heart failure: Secondary | ICD-10-CM | POA: Diagnosis present

## 2019-06-24 DIAGNOSIS — Z1619 Resistance to other specified beta lactam antibiotics: Secondary | ICD-10-CM | POA: Diagnosis present

## 2019-06-24 DIAGNOSIS — R1011 Right upper quadrant pain: Secondary | ICD-10-CM | POA: Diagnosis present

## 2019-06-24 DIAGNOSIS — I130 Hypertensive heart and chronic kidney disease with heart failure and stage 1 through stage 4 chronic kidney disease, or unspecified chronic kidney disease: Secondary | ICD-10-CM | POA: Diagnosis present

## 2019-06-24 DIAGNOSIS — I5032 Chronic diastolic (congestive) heart failure: Secondary | ICD-10-CM | POA: Diagnosis not present

## 2019-06-24 DIAGNOSIS — E1151 Type 2 diabetes mellitus with diabetic peripheral angiopathy without gangrene: Secondary | ICD-10-CM | POA: Diagnosis present

## 2019-06-24 DIAGNOSIS — E785 Hyperlipidemia, unspecified: Secondary | ICD-10-CM | POA: Diagnosis present

## 2019-06-24 DIAGNOSIS — A419 Sepsis, unspecified organism: Secondary | ICD-10-CM | POA: Diagnosis present

## 2019-06-24 DIAGNOSIS — I2510 Atherosclerotic heart disease of native coronary artery without angina pectoris: Secondary | ICD-10-CM | POA: Diagnosis present

## 2019-06-24 DIAGNOSIS — E861 Hypovolemia: Secondary | ICD-10-CM | POA: Diagnosis present

## 2019-06-24 DIAGNOSIS — E1129 Type 2 diabetes mellitus with other diabetic kidney complication: Secondary | ICD-10-CM | POA: Diagnosis present

## 2019-06-24 DIAGNOSIS — Z20828 Contact with and (suspected) exposure to other viral communicable diseases: Secondary | ICD-10-CM | POA: Diagnosis present

## 2019-06-24 DIAGNOSIS — E039 Hypothyroidism, unspecified: Secondary | ICD-10-CM | POA: Diagnosis present

## 2019-06-24 DIAGNOSIS — R109 Unspecified abdominal pain: Secondary | ICD-10-CM | POA: Diagnosis present

## 2019-06-24 DIAGNOSIS — K8000 Calculus of gallbladder with acute cholecystitis without obstruction: Secondary | ICD-10-CM | POA: Diagnosis present

## 2019-06-24 DIAGNOSIS — E860 Dehydration: Secondary | ICD-10-CM | POA: Diagnosis present

## 2019-06-24 DIAGNOSIS — E538 Deficiency of other specified B group vitamins: Secondary | ICD-10-CM | POA: Diagnosis present

## 2019-06-24 DIAGNOSIS — E1122 Type 2 diabetes mellitus with diabetic chronic kidney disease: Secondary | ICD-10-CM | POA: Diagnosis present

## 2019-06-24 DIAGNOSIS — N183 Chronic kidney disease, stage 3 unspecified: Secondary | ICD-10-CM | POA: Diagnosis present

## 2019-06-24 DIAGNOSIS — E871 Hypo-osmolality and hyponatremia: Secondary | ICD-10-CM | POA: Diagnosis present

## 2019-06-24 DIAGNOSIS — I248 Other forms of acute ischemic heart disease: Secondary | ICD-10-CM | POA: Diagnosis present

## 2019-06-24 DIAGNOSIS — I25810 Atherosclerosis of coronary artery bypass graft(s) without angina pectoris: Secondary | ICD-10-CM | POA: Diagnosis present

## 2019-06-24 DIAGNOSIS — I272 Pulmonary hypertension, unspecified: Secondary | ICD-10-CM | POA: Diagnosis present

## 2019-06-24 DIAGNOSIS — I491 Atrial premature depolarization: Secondary | ICD-10-CM | POA: Diagnosis present

## 2019-06-24 DIAGNOSIS — N3000 Acute cystitis without hematuria: Secondary | ICD-10-CM | POA: Diagnosis not present

## 2019-06-24 DIAGNOSIS — R197 Diarrhea, unspecified: Secondary | ICD-10-CM | POA: Diagnosis not present

## 2019-06-24 DIAGNOSIS — I083 Combined rheumatic disorders of mitral, aortic and tricuspid valves: Secondary | ICD-10-CM | POA: Diagnosis present

## 2019-06-24 LAB — CBC
HCT: 35.5 % — ABNORMAL LOW (ref 36.0–46.0)
Hemoglobin: 11.2 g/dL — ABNORMAL LOW (ref 12.0–15.0)
MCH: 30 pg (ref 26.0–34.0)
MCHC: 31.5 g/dL (ref 30.0–36.0)
MCV: 95.2 fL (ref 80.0–100.0)
Platelets: 137 10*3/uL — ABNORMAL LOW (ref 150–400)
RBC: 3.73 MIL/uL — ABNORMAL LOW (ref 3.87–5.11)
RDW: 13.5 % (ref 11.5–15.5)
WBC: 12.8 10*3/uL — ABNORMAL HIGH (ref 4.0–10.5)
nRBC: 0 % (ref 0.0–0.2)

## 2019-06-24 LAB — SARS CORONAVIRUS 2 BY RT PCR (HOSPITAL ORDER, PERFORMED IN ~~LOC~~ HOSPITAL LAB): SARS Coronavirus 2: NEGATIVE

## 2019-06-24 LAB — GLUCOSE, CAPILLARY
Glucose-Capillary: 172 mg/dL — ABNORMAL HIGH (ref 70–99)
Glucose-Capillary: 118 mg/dL — ABNORMAL HIGH (ref 70–99)
Glucose-Capillary: 207 mg/dL — ABNORMAL HIGH (ref 70–99)

## 2019-06-24 LAB — BRAIN NATRIURETIC PEPTIDE: B Natriuretic Peptide: 414.2 pg/mL — ABNORMAL HIGH (ref 0.0–100.0)

## 2019-06-24 LAB — BASIC METABOLIC PANEL
Anion gap: 12 (ref 5–15)
BUN: 25 mg/dL — ABNORMAL HIGH (ref 8–23)
CO2: 22 mmol/L (ref 22–32)
Calcium: 8.4 mg/dL — ABNORMAL LOW (ref 8.9–10.3)
Chloride: 99 mmol/L (ref 98–111)
Creatinine, Ser: 1.33 mg/dL — ABNORMAL HIGH (ref 0.44–1.00)
GFR calc Af Amer: 43 mL/min — ABNORMAL LOW (ref >60)
GFR calc non Af Amer: 37 mL/min — ABNORMAL LOW (ref >60)
Glucose, Bld: 184 mg/dL — ABNORMAL HIGH (ref 70–99)
Potassium: 4 mmol/L (ref 3.5–5.1)
Sodium: 133 mmol/L — ABNORMAL LOW (ref 135–145)

## 2019-06-24 LAB — OSMOLALITY, URINE: Osmolality, Ur: 466 mOsm/kg (ref 300–900)

## 2019-06-24 LAB — LACTIC ACID, PLASMA: Lactic Acid, Venous: 1.8 mmol/L (ref 0.5–1.9)

## 2019-06-24 LAB — TSH: TSH: 1.435 u[IU]/mL (ref 0.350–4.500)

## 2019-06-24 LAB — HEMOGLOBIN A1C
Hgb A1c MFr Bld: 7.1 % — ABNORMAL HIGH (ref 4.8–5.6)
Mean Plasma Glucose: 157.07 mg/dL

## 2019-06-24 LAB — SODIUM, URINE, RANDOM: Sodium, Ur: 56 mmol/L

## 2019-06-24 LAB — OSMOLALITY: Osmolality: 288 mOsm/kg (ref 275–295)

## 2019-06-24 MED ORDER — MORPHINE SULFATE (PF) 2 MG/ML IV SOLN
0.5000 mg | INTRAVENOUS | Status: DC | PRN
  Administered 2019-06-25 – 2019-06-28 (×5): 0.5 mg via INTRAVENOUS
  Filled 2019-06-24 (×6): qty 1

## 2019-06-24 MED ORDER — INSULIN ASPART 100 UNIT/ML ~~LOC~~ SOLN
0.0000 [IU] | Freq: Every day | SUBCUTANEOUS | Status: DC
  Administered 2019-06-24: 2 [IU] via SUBCUTANEOUS
  Administered 2019-06-29: 4 [IU] via SUBCUTANEOUS

## 2019-06-24 MED ORDER — VITAMIN B-12 1000 MCG PO TABS
5000.0000 ug | ORAL_TABLET | Freq: Every day | ORAL | Status: DC
  Administered 2019-06-24 – 2019-06-30 (×4): 5000 ug via ORAL
  Filled 2019-06-24 (×6): qty 5

## 2019-06-24 MED ORDER — MORPHINE SULFATE (PF) 4 MG/ML IV SOLN: 2.3000 mg | Freq: Once | INTRAVENOUS | Status: DC

## 2019-06-24 MED ORDER — ACETAMINOPHEN 650 MG RE SUPP: 650.0000 mg | Freq: Four times a day (QID) | RECTAL | Status: DC | PRN

## 2019-06-24 MED ORDER — MORPHINE SULFATE (PF) 4 MG/ML IV SOLN
INTRAVENOUS | Status: AC
  Filled 2019-06-24: qty 1

## 2019-06-24 MED ORDER — LEVOTHYROXINE SODIUM 25 MCG PO TABS
125.0000 ug | ORAL_TABLET | Freq: Every day | ORAL | Status: DC
  Administered 2019-06-25 – 2019-06-30 (×4): 125 ug via ORAL
  Filled 2019-06-24 (×4): qty 1

## 2019-06-24 MED ORDER — SODIUM CHLORIDE 0.9 % IV SOLN
INTRAVENOUS | Status: DC
  Administered 2019-06-24 – 2019-06-27 (×5): via INTRAVENOUS

## 2019-06-24 MED ORDER — INSULIN ASPART 100 UNIT/ML ~~LOC~~ SOLN
0.0000 [IU] | Freq: Three times a day (TID) | SUBCUTANEOUS | Status: DC
  Administered 2019-06-24 – 2019-06-27 (×3): 2 [IU] via SUBCUTANEOUS
  Administered 2019-06-27: 13:00:00 1 [IU] via SUBCUTANEOUS
  Administered 2019-06-27 – 2019-06-28 (×2): 2 [IU] via SUBCUTANEOUS
  Administered 2019-06-28: 1 [IU] via SUBCUTANEOUS
  Administered 2019-06-28: 2 [IU] via SUBCUTANEOUS
  Administered 2019-06-29: 5 [IU] via SUBCUTANEOUS
  Administered 2019-06-29 (×2): 2 [IU] via SUBCUTANEOUS
  Administered 2019-06-30: 3 [IU] via SUBCUTANEOUS
  Administered 2019-06-30: 2 [IU] via SUBCUTANEOUS

## 2019-06-24 MED ORDER — METRONIDAZOLE IN NACL 5-0.79 MG/ML-% IV SOLN
500.0000 mg | Freq: Three times a day (TID) | INTRAVENOUS | Status: DC
  Administered 2019-06-24 – 2019-06-26 (×6): 500 mg via INTRAVENOUS
  Filled 2019-06-24 (×6): qty 100

## 2019-06-24 MED ORDER — SODIUM CHLORIDE 0.9 % IV SOLN
1.0000 g | Freq: Once | INTRAVENOUS | Status: DC
  Filled 2019-06-24: qty 10

## 2019-06-24 MED ORDER — ACETAMINOPHEN 325 MG PO TABS: 650.0000 mg | ORAL_TABLET | Freq: Four times a day (QID) | ORAL | Status: DC | PRN

## 2019-06-24 MED ORDER — METRONIDAZOLE IN NACL 5-0.79 MG/ML-% IV SOLN
500.0000 mg | Freq: Once | INTRAVENOUS | Status: AC
  Administered 2019-06-24: 500 mg via INTRAVENOUS
  Filled 2019-06-24: qty 100

## 2019-06-24 MED ORDER — TECHNETIUM TC 99M MEBROFENIN IV KIT
5.0700 | PACK | Freq: Once | INTRAVENOUS | Status: AC | PRN
  Administered 2019-06-24: 5.07 via INTRAVENOUS

## 2019-06-24 MED ORDER — ONDANSETRON HCL 4 MG/2ML IJ SOLN
4.0000 mg | Freq: Three times a day (TID) | INTRAMUSCULAR | Status: DC | PRN
  Administered 2019-06-28: 4 mg via INTRAVENOUS
  Filled 2019-06-24: qty 2

## 2019-06-24 MED ORDER — SODIUM CHLORIDE 0.9 % IV SOLN
2.0000 g | INTRAVENOUS | Status: DC
  Administered 2019-06-24 – 2019-06-25 (×2): 2 g via INTRAVENOUS
  Filled 2019-06-24: qty 2
  Filled 2019-06-24: qty 20
  Filled 2019-06-24: qty 2

## 2019-06-24 MED ORDER — GABAPENTIN 100 MG PO CAPS
100.0000 mg | ORAL_CAPSULE | Freq: Three times a day (TID) | ORAL | Status: DC | PRN
  Filled 2019-06-24: qty 1

## 2019-06-24 MED ORDER — SODIUM CHLORIDE 0.9 % IV SOLN: 2.0000 g | Freq: Once | INTRAVENOUS | Status: DC

## 2019-06-24 MED ORDER — CARVEDILOL 12.5 MG PO TABS
12.5000 mg | ORAL_TABLET | Freq: Two times a day (BID) | ORAL | Status: DC
  Administered 2019-06-24 (×2): 12.5 mg via ORAL
  Filled 2019-06-24 (×2): qty 1

## 2019-06-24 MED ORDER — SODIUM CHLORIDE 0.9 % IV BOLUS
500.0000 mL | Freq: Once | INTRAVENOUS | Status: AC
  Administered 2019-06-24: 500 mL via INTRAVENOUS

## 2019-06-24 MED ORDER — ROSUVASTATIN CALCIUM 5 MG PO TABS
10.0000 mg | ORAL_TABLET | Freq: Every day | ORAL | Status: DC
  Administered 2019-06-24 – 2019-06-29 (×6): 10 mg via ORAL
  Filled 2019-06-24 (×6): qty 2

## 2019-06-24 NOTE — ED Notes (Signed)
ED TO INPATIENT HANDOFF REPORT  ED Nurse Name and Phone #: Maryruth Hancock RN  S Name/Age/Gender Margaret [name] 85 y.o. female Room/Bed: 015C/015C  Code Status   Code Status: Full Code  Home/SNF/Other Home Patient oriented to: self, place, time and situation Is this baseline? Yes   Triage Complete: Triage complete  Chief Complaint Stomach pain, back pain  Triage Note Pt is here with mid upper abdominal pain which began yesterday. Pt has pain in epigastric area which is radiating to back as well as RLQ pain.  Pt was given mylanta without any relief. Pt denies any SOB, no N/V.  Pt has been having lots of belching with this. Translated by daughter as pt speaks gujarati and hindi.     Allergies Allergies  Allergen Reactions  . Myoview [Technetium-26m] Shortness Of Breath  . Penicillins Other (See Comments)    Unknown allergic reaction     Level of Care/Admitting Diagnosis ED Disposition    ED Disposition Condition Comment   Admit  Hospital Area: [facility] [[id number]]  Level of Care: Telemetry Medical [104]  Covid Evaluation: Asymptomatic Screening Protocol (No Symptoms)  Diagnosis: Abdominal pain [076226]  Admitting Physician: Lorretta Harp [[id number]]  Attending Physician: Lorretta Harp [[id number]]  Estimated length of stay: past midnight tomorrow  Certification:: I certify this patient will need inpatient services for at least 2 midnights  PT Class (Do Not Modify): Inpatient [101]  PT Acc Code (Do Not Modify): Private [1]       B Medical/Surgery History Past Medical History:  Diagnosis Date  . Acute on chronic renal failure (HCC) 07/31/2014  . Acute pulmonary edema with congestive heart failure (HCC) 07/17/2014  . Acute renal insufficiency 07/19/2014  . Anemia   . ARF (acute renal failure) (HCC) 07/31/2014  . Atherosclerotic peripheral vascular disease with intermittent claudication (HCC) 04/2014   LEA Dopplers: RABI 1.02/ LABI: 0.61 (L SFA, Pop & DP blunted wave forms)  -- correlates with L leg claudication  . Atrophic vaginitis 04/2013  . CAD (coronary artery disease)    a. prior PCI LAD 2005. b. CABG 2011.  Marland Kitchen Chronic diastolic CHF (congestive heart failure) (HCC)   . CKD (chronic kidney disease), stage III (HCC)   . Hx of CABG   . Hx of echocardiogram 05/2014   EF 50-55%, inf-lat HK, Gr 2 DD, very mild AS (mean 10 mmHg), mild MR, PASP 36 mmHg; calcified density in LA (suggest TEE)  . Hyperlipidemia with target LDL less than 70   . Mitral regurgitation   . PAD (peripheral artery disease) (HCC)   . Pulmonary hypertension (HCC)   . RESPIRATORY ARREST assoc w MYOVIEW   . Thyroid disease   . Tricuspid regurgitation   . Type 2 diabetes mellitus (HCC)   . Vitamin B12 deficiency    Past Surgical History:  Procedure Laterality Date  . CARDIAC CATHETERIZATION  2005   LAD stent occluded  . CARDIAC CATHETERIZATION  03/2010    Ostial left main 60, OM1 70, circumflex 50-60, mid LAD stent occluded, mid RCA 30-40, RV marginal 90, EF 45-50%. >>> CABG  . CORONARY ARTERY BYPASS GRAFT  03/2010   LIMA-LAD, SVG-D1, seqSVG-OM1-OM3  . LEFT HEART CATHETERIZATION WITH CORONARY/GRAFT ANGIOGRAM N/A 07/22/2014   Procedure: LEFT HEART CATHETERIZATION WITH Isabel Caprice;  Surgeon: Iran Ouch, MD;  Location: [facility] CATH LAB;  Service: Cardiovascular;  Laterality: N/A;  . TOTAL KNEE ARTHROPLASTY       A IV Location/Drains/Wounds Patient Lines/Drains/Airways Status  Active Line/Drains/Airways    Name:   Placement date:   Placement time:   Site:   Days:   Peripheral IV 06/23/19 Right Antecubital   06/23/19    2334    Antecubital   1          Intake/Output Last 24 hours No intake or output data in the 24 hours ending 06/24/19 5284  Labs/Imaging Results for orders placed or performed during the hospital encounter of 06/23/19 (from the past 48 hour(s))  Lipase, blood     Status: None   Collection Time: 06/23/19  5:28 PM  Result Value Ref Range   Lipase 25  11 - 51 U/L    Comment: Performed at [facility], [place] [address] [place], [place] [address]  Comprehensive metabolic panel     Status: Abnormal   Collection Time: 06/23/19  5:28 PM  Result Value Ref Range   Sodium 128 (L) 135 - 145 mmol/L   Potassium 4.0 3.5 - 5.1 mmol/L   Chloride 92 (L) 98 - 111 mmol/L   CO2 24 22 - 32 mmol/L   Glucose, Bld 302 (H) 70 - 99 mg/dL   BUN 22 8 - 23 mg/dL   Creatinine, Ser 1.36 (H) 0.44 - 1.00 mg/dL   Calcium 8.9 8.9 - 10.3 mg/dL   Total Protein 7.3 6.5 - 8.1 g/dL   Albumin 3.2 (L) 3.5 - 5.0 g/dL   AST 19 15 - 41 U/L   ALT 16 0 - 44 U/L   Alkaline Phosphatase 59 38 - 126 U/L   Total Bilirubin 1.1 0.3 - 1.2 mg/dL   GFR calc non Af Amer 36 (L) >60 mL/min   GFR calc Af Amer 42 (L) >60 mL/min   Anion gap 12 5 - 15    Comment: Performed at [place] [address] [place], [place] [address]  CBC     Status: Abnormal   Collection Time: 06/23/19  5:28 PM  Result Value Ref Range   WBC 15.7 (H) 4.0 - 10.5 K/uL   RBC 3.93 3.87 - 5.11 MIL/uL   Hemoglobin 11.9 (L) 12.0 - 15.0 g/dL   HCT 37.3 36.0 - 46.0 %   MCV 94.9 80.0 - 100.0 fL   MCH 30.3 26.0 - 34.0 pg   MCHC 31.9 30.0 - 36.0 g/dL   RDW 13.4 11.5 - 15.5 %   Platelets 162 150 - 400 K/uL   nRBC 0.0 0.0 - 0.2 %    Comment: Performed at [facility], [place] [address] [place], [place] [address]  Urinalysis, Routine w reflex microscopic     Status: Abnormal   Collection Time: 06/23/19  7:41 PM  Result Value Ref Range   Color, Urine AMBER (A) YELLOW    Comment: BIOCHEMICALS MAY BE AFFECTED BY COLOR   APPearance TURBID (A) CLEAR   Specific Gravity, Urine 1.017 1.005 - 1.030   pH 5.0 5.0 - 8.0   Glucose, UA 150 (A) NEGATIVE mg/dL   Hgb urine dipstick SMALL (A) NEGATIVE   Bilirubin Urine NEGATIVE NEGATIVE   Ketones, ur 5 (A) NEGATIVE mg/dL   Protein, ur 100 (A) NEGATIVE mg/dL   Nitrite NEGATIVE NEGATIVE   Leukocytes,Ua LARGE (A) NEGATIVE   RBC / HPF 0-5 0 - 5 RBC/hpf   WBC, UA  >50 (H) 0 - 5 WBC/hpf   Bacteria, UA MANY (A) NONE SEEN    Comment: Performed at [facility], [address]  [place], [place] [address]  Lactic acid, plasma     Status: None   Collection Time: 06/23/19 11:45 PM  Result Value Ref Range   Lactic Acid, Venous 1.8 0.5 - 1.9 mmol/L    Comment: Performed at [facility], [address] [place], [place] [address]  SARS Coronavirus 2 [facility](Hospital order, Performed in [facility] hospital lab) Nasopharyngeal Nasopharyngeal Swab     Status: None   Collection Time: 06/24/19  3:05 AM   Specimen: Nasopharyngeal Swab  Result Value Ref Range   SARS Coronavirus 2 NEGATIVE NEGATIVE    Comment: (NOTE) If result is NEGATIVE SARS-CoV-2 target nucleic acids are NOT DETECTED. The SARS-CoV-2 RNA is generally detectable in upper and lower  respiratory specimens during the acute phase of infection. The lowest  concentration of SARS-CoV-2 viral copies this assay can detect is 250  copies / mL. A negative result does not preclude SARS-CoV-2 infection  and should not be used as the sole basis for treatment or other  patient management decisions.  A negative result may occur with  improper specimen collection / handling, submission of specimen other  than nasopharyngeal swab, presence of viral mutation(s) within the  areas targeted by this assay, and inadequate number of viral copies  (<250 copies / mL). A negative result must be combined with clinical  observations, patient history, and epidemiological information. If result is POSITIVE SARS-CoV-2 target nucleic acids are DETECTED. The SARS-CoV-2 RNA is generally detectable in upper and lower  respiratory specimens dur ing the acute phase of infection.  Positive  results are indicative of active infection with SARS-CoV-2.  Clinical  correlation with patient history and other diagnostic information is  necessary to determine patient infection status.  Positive results do  not rule out bacterial  infection or co-infection with other viruses. If result is PRESUMPTIVE POSTIVE SARS-CoV-2 nucleic acids MAY BE PRESENT.   A presumptive positive result was obtained on the submitted specimen  and confirmed on repeat testing.  While 2019 novel coronavirus  (SARS-CoV-2) nucleic acids may be present in the submitted sample  additional confirmatory testing may be necessary for epidemiological  and / or clinical management purposes  to differentiate between  SARS-CoV-2 and other Sarbecovirus currently known to infect humans.  If clinically indicated additional testing with an alternate test  methodology [phone]([id number]) is advised. The SARS-CoV-2 RNA is generally  detectable in upper and lower respiratory sp ecimens during the acute  phase of infection. The expected result is Negative. Fact Sheet for Patients:  BoilerBrush.com.cyhttps://www.fda.gov/media/136312/download Fact Sheet for Healthcare Providers: https://pope.com/https://www.fda.gov/media/136313/download This test is not yet approved or cleared by the [place] FDA and has been authorized for detection and/or diagnosis of SARS-CoV-2 by FDA under an Emergency Use Authorization (EUA).  This EUA will remain in effect (meaning this test can be used) for the duration of the COVID-19 declaration under Section 564(b)(1) of the Act, 21 U.S.C. section 360bbb-3(b)(1), unless the authorization is terminated or revoked sooner. Performed at [facility], [address] [place], [place] [address]    Dg Chest Port 1 View  Result Date: 06/23/2019 CLINICAL DATA:  Initial evaluation for acute fever, epigastric pain. EXAM: PORTABLE CHEST 1 VIEW COMPARISON:  Prior radiograph from 07/31/2014. FINDINGS: Median sternotomy wires underlying surgical clips and CABG markers noted. Cardiomegaly, stable. Mediastinal silhouette normal. Aortic atherosclerosis. Lungs mildly hypoinflated. Perihilar vascular congestion without overt pulmonary edema. No pleural effusion. No consolidative  airspace opacity. No pneumothorax. Few scattered calcified granulomata noted. No acute  osseous abnormality. Soft tissue calcifications overlie the left neck and chest. IMPRESSION: 1. Stable cardiomegaly with mild perihilar vascular congestion without overt pulmonary edema. 2. No other active cardiopulmonary disease. Electronically Signed   By: Rise MuBenjamin  McClintock M.D.   On: 06/23/2019 23:25   [place] Abdomen Limited Ruq  Result Date: 06/24/2019 CLINICAL DATA:  Right upper quadrant pain. EXAM: ULTRASOUND ABDOMEN LIMITED RIGHT UPPER QUADRANT COMPARISON:  None. FINDINGS: Gallbladder: Distended with multiple gallstones. Borderline wall thickening of 3-4 mm. No pericholecystic fluid. No sonographic Murphy sign noted by sonographer. Common bile duct: Diameter: 3-4 mm, normal. Liver: No focal lesion identified. Within normal limits in parenchymal echogenicity. Portal vein is patent on color Doppler imaging with normal direction of blood flow towards the liver. Other: None. IMPRESSION: 1. Gallstones with borderline wall thickening of 3-4 mm. Consider nuclear medicine HIDA scan if there is clinical concern for acute cholecystitis. No sonographic Murphy sign or pericholecystic fluid. 2. No biliary dilatation. Electronically Signed   By: Narda RutherfordMelanie  Sanford M.D.   On: 06/24/2019 01:20    Pending Labs Unresulted Labs (From admission, onward)    Start     Ordered   06/24/19 0535  Brain natriuretic peptide  ONCE - STAT,   STAT     06/24/19 0534   06/24/19 0535  Osmolality  Once,   STAT     06/24/19 0534   06/24/19 0535  Osmolality, urine  Tomorrow morning,   STAT     06/24/19 0534   06/24/19 0535  Sodium, urine, random  Once,   STAT     06/24/19 0534   06/24/19 0535  TSH  Once,   STAT     06/24/19 0534   06/24/19 0535  Basic metabolic panel  Tomorrow morning,   R     06/24/19 0534   06/24/19 0535  CBC  Tomorrow morning,   R     06/24/19 0534   06/24/19 0535  Hemoglobin A1c  Once,   STAT    Comments: To assess  prior glycemic control    06/24/19 0534   06/23/19 2247  Urine culture  Add-on,   AD     06/23/19 2249   06/23/19 2247  Blood culture (routine x 2)  BLOOD CULTURE X 2,   STAT     06/23/19 2249          Vitals/Pain Today's Vitals   06/24/19 0500 06/24/19 0515 06/24/19 0530 06/24/19 0600  BP: (!) 118/54 (!) 116/53 (!) 122/57 (!) 113/56  Pulse: 94 95 96 95  Resp:      Temp:      TempSrc:      SpO2: 97% 97% 97% 99%  Weight:      PainSc:        Isolation Precautions No active isolations  Medications Medications  sodium chloride flush (NS) 0.9 % injection 3 mL (has no administration in time range)  morphine 2 MG/ML injection 0.5 mg (has no administration in time range)  ondansetron (ZOFRAN) injection 4 mg (has no administration in time range)  0.9 %  sodium chloride infusion (has no administration in time range)  acetaminophen (TYLENOL) tablet 650 mg (has no administration in time range)    Or  acetaminophen (TYLENOL) suppository 650 mg (has no administration in time range)  cefTRIAXone (ROCEPHIN) 2 g in sodium chloride 0.9 % 100 mL IVPB (has no administration in time range)  metroNIDAZOLE (FLAGYL) IVPB 500 mg (has no administration in time range)  insulin aspart (novoLOG) injection  0-9 Units (has no administration in time range)  insulin aspart (novoLOG) injection 0-5 Units (has no administration in time range)  fentaNYL (SUBLIMAZE) injection 50 mcg (50 mcg Intravenous Given 06/23/19 2358)  ondansetron (ZOFRAN) injection 4 mg (4 mg Intravenous Given 06/23/19 2357)  cefTRIAXone (ROCEPHIN) 1 g in sodium chloride 0.9 % 100 mL IVPB (0 g Intravenous Stopped 06/24/19 0244)  metroNIDAZOLE (FLAGYL) IVPB 500 mg (0 mg Intravenous Stopped 06/24/19 0609)    Mobility walks Moderate fall risk   Focused Assessments Cardiac Assessment Handoff:  Cardiac Rhythm: Normal sinus rhythm Lab Results  Component Value Date   CKTOTAL 40 03/14/2010   CKMB 1.2 03/14/2010   TROPONINI <0.30  07/31/2014   No results found for: DDIMER Does the Patient currently have chest pain? No     R Recommendations: See Admitting Provider Note  Report given to:   Additional Notes: Pt alert and oriented.

## 2019-06-24 NOTE — Progress Notes (Signed)
Pt noted having loose stools x3. Noted heart rate elevated ranges from 130's to 140's. Called on call K. Eubanks received new orders: NS 500 ml x1. Stool for CDiff and GI Panel by PCR.

## 2019-06-24 NOTE — ED Notes (Signed)
Patient daughter Margaret [name] [phone] asking if we need anything or when we are to discharge her to please call her.

## 2019-06-24 NOTE — Progress Notes (Signed)
Pt returned to room 6N26 after Hida scan. Pt remains NPO except sips with meds per Nuclear Medicine. Will continue to monitor.

## 2019-06-24 NOTE — H&P (Signed)
History and Physical    Margaret [name] ZOX:[id number] DOB: 06/09/1934 DOA: 06/23/2019  Referring MD/NP/PA:   PCP: Ileana LaddWong, Francis P, MD   Patient coming from:  The patient is coming from home.  At baseline, pt is independent for most of ADL.        Chief Complaint: RUQ Abdominal pain  HPI: Margaret AversSudha [name] is a 85 y.o. female with medical history significant of hyperlipidemia, diabetes mellitus, hypothyroidism, vitamin B12 deficiency, PVD, pulmonary hypertension, CAD, CABG, sCHF with EF of 45%, CKD stage III, who presents with RUQ abdominal pain.  Pt speaks gujarati and hindi, and little [place]. The history is obtained with her daughter's help. Per her daughter, patient started having chest pain since yesterday, which is located in the right upper quadrant, constant, sharp, radiating to the back.  Patient does not have nausea vomiting or diarrhea.  Patient had mild fever of 100.  Patient was seen in urgent care, and was sent to ED for further evaluation treatment.  Patient does not have chest pain, cough, shortness of breath.  Patient has mild discomfort on urination. No unilateral weakness.  ED Course: pt was found to have WBC 15.7, positive urinalysis (turbid appearance, large amount of leukocyte, many bacteria, WBC>50), pending COVID-19 test, lactic acid 1.8, stable renal function, sodium 128, currently temperature 99, blood pressure 138/82, heart rate 80 to 90s, oxygen sat 98% on room air.  Chest x-ray showed stable cardiomegaly with mild perihilar vascular congestion.    RUQ-US showed: 1. Gallstones with borderline wall thickening of 3-4 mm. No sonographic Murphy sign or pericholecystic fluid. Consider nuclear medicine HIDA scan if there is clinical concern for acute cholecystitis.  2. No biliary dilatation  Review of Systems:   General: no fevers, chills, no body weight gain, has fatigue HEENT: no blurry vision, hearing changes or sore throat Respiratory: no dyspnea, coughing, wheezing CV: no  chest pain, no palpitations GI: no nausea, vomiting, has abdominal pain, no diarrhea, constipation GU: no dysuria, burning on urination, increased urinary frequency, hematuria  Ext: no leg edema Neuro: no unilateral weakness, numbness, or tingling, no vision change or hearing loss Skin: no rash, no skin tear. MSK: No muscle spasm, no deformity, no limitation of range of movement in spin Heme: No easy bruising.  Travel history: No recent long distant travel.  Allergy:  Allergies  Allergen Reactions  . Myoview [Technetium-6124m] Shortness Of Breath  . Penicillins Other (See Comments)    Unknown allergic reaction     Past Medical History:  Diagnosis Date  . Acute on chronic renal failure (HCC) 07/31/2014  . Acute pulmonary edema with congestive heart failure (HCC) 07/17/2014  . Acute renal insufficiency 07/19/2014  . Anemia   . ARF (acute renal failure) (HCC) 07/31/2014  . Atherosclerotic peripheral vascular disease with intermittent claudication (HCC) 04/2014   LEA Dopplers: RABI 1.02/ LABI: 0.61 (L SFA, Pop & DP blunted wave forms) -- correlates with L leg claudication  . Atrophic vaginitis 04/2013  . CAD (coronary artery disease)    a. prior PCI LAD 2005. b. CABG 2011.  Marland Kitchen. Chronic diastolic CHF (congestive heart failure) (HCC)   . CKD (chronic kidney disease), stage III (HCC)   . Hx of CABG   . Hx of echocardiogram 05/2014   EF 50-55%, inf-lat HK, Gr 2 DD, very mild AS (mean 10 mmHg), mild MR, PASP 36 mmHg; calcified density in LA (suggest TEE)  . Hyperlipidemia with target LDL less than 70   . Mitral regurgitation   .  PAD (peripheral artery disease) ([place])   . Pulmonary hypertension ([place])   . RESPIRATORY ARREST assoc w MYOVIEW   . Thyroid disease   . Tricuspid regurgitation   . Type 2 diabetes mellitus ([place])   . Vitamin B12 deficiency     Past Surgical History:  Procedure Laterality Date  . CARDIAC CATHETERIZATION  2005   LAD stent occluded  . CARDIAC CATHETERIZATION  03/2010     Ostial left main 60, OM1 70, circumflex 50-60, mid LAD stent occluded, mid RCA 30-40, RV marginal 90, EF 45-50%. >>> CABG  . CORONARY ARTERY BYPASS GRAFT  03/2010   LIMA-LAD, SVG-D1, seqSVG-OM1-OM3  . LEFT HEART CATHETERIZATION WITH CORONARY/GRAFT ANGIOGRAM N/A 07/22/2014   Procedure: LEFT HEART CATHETERIZATION WITH Beatrix Fetters;  Surgeon: Wellington Hampshire, MD;  Location: [facility];  Service: Cardiovascular;  Laterality: N/A;  . TOTAL KNEE ARTHROPLASTY      Social History:  reports that she has never smoked. She has never used smokeless tobacco. She reports that she does not drink alcohol or use drugs.  Family History:  Family History  Problem Relation Age of Onset  . Diabetes Father   . Ulcers Other   . Diabetes Other   . Arthritis Other   . Diabetes Other   . Heart attack Neg Hx      Prior to Admission medications   Medication Sig Start Date End Date Taking? Authorizing Provider  carvedilol (COREG) 12.5 MG tablet Take 1 tablet (12.5 mg total) by mouth 2 (two) times daily with a meal. 08/11/14   Wellington Hampshire, MD  clopidogrel (PLAVIX) 75 MG tablet Take 75 mg by mouth daily.    [provider]  Cyanocobalamin (VITAMIN B-12) 2500 MCG TABS Take 5,000 mcg by mouth daily. 08/02/14   Geradine Girt, DO  furosemide (LASIX) 40 MG tablet Take 40 mg by mouth every morning. 08/02/14   Geradine Girt, DO  gabapentin (NEURONTIN) 100 MG capsule 100 mg, 3 times a day as needed for nerve pain 09/11/14   Elayne Snare, MD  glipiZIDE-metformin (METAGLIP) 5-500 MG per tablet Take 1 tablet by mouth 2 (two) times daily before a meal.    [provider]  levothyroxine (SYNTHROID, LEVOTHROID) 125 MCG tablet Take 1 tablet (125 mcg total) by mouth daily before breakfast. 07/24/14   Barrett, Evelene Croon, PA-C  linagliptin (TRADJENTA) 5 MG TABS tablet Take 5 mg by mouth every morning.    [provider]  rosuvastatin (CRESTOR) 10 MG tablet Take 1 tablet (10 mg total) by  mouth daily after supper. [place] from [place] 07/24/14   Reola Mosher    Physical Exam: Vitals:   06/24/19 0515 06/24/19 0530 06/24/19 0600 06/24/19 0652  BP: (!) 116/53 (!) 122/57 (!) 113/56 (!) 114/49  Pulse: 95 96 95 91  Resp:    20  Temp:    98.4 F (36.9 C)  TempSrc:    Oral  SpO2: 97% 97% 99% 100%  Weight:    57.6 kg  Height:    5' (1.524 m)   General: Not in acute distress HEENT:       Eyes: PERRL, EOMI, no scleral icterus.       ENT: No discharge from the ears and nose, no pharynx injection, no tonsillar enlargement.        Neck: No JVD, no bruit, no mass felt. Heme: No neck lymph node enlargement. Cardiac: S1/S2, RRR, No murmurs, No gallops or rubs. Respiratory: No rales, wheezing,  rhonchi or rubs. GI: Soft, nondistended, has RUQ tender, no rebound pain, no organomegaly, BS present. GU: No hematuria Ext: No pitting leg edema bilaterally. 2+DP/PT pulse bilaterally. Musculoskeletal: No joint deformities, No joint redness or warmth, no limitation of ROM in spin. Skin: No rashes.  Neuro: Alert, oriented X3, cranial nerves II-XII grossly intact, moves all extremities normally. Psych: Patient is not psychotic, no suicidal or hemocidal ideation.  Labs on Admission: I have personally reviewed following labs and imaging studies  CBC: Recent Labs  Lab 06/23/19 1728 06/24/19 0603  WBC 15.7* 12.8*  HGB 11.9* 11.2*  HCT 37.3 35.5*  MCV 94.9 95.2  PLT 162 137*   Basic Metabolic Panel: Recent Labs  Lab 06/23/19 1728 06/24/19 0603  NA 128* 133*  K 4.0 4.0  CL 92* 99  CO2 24 22  GLUCOSE 302* 184*  BUN 22 25*  CREATININE 1.36* 1.33*  CALCIUM 8.9 8.4*   GFR: Estimated Creatinine Clearance: 25.4 mL/min (A) (by C-G formula based on SCr of 1.33 mg/dL (H)). Liver Function Tests: Recent Labs  Lab 06/23/19 1728  AST 19  ALT 16  ALKPHOS 59  BILITOT 1.1  PROT 7.3  ALBUMIN 3.2*   Recent Labs  Lab 06/23/19 1728  LIPASE 25   No results for input(s):  AMMONIA in the last 168 hours. Coagulation Profile: No results for input(s): INR, PROTIME in the last 168 hours. Cardiac Enzymes: No results for input(s): CKTOTAL, CKMB, CKMBINDEX, TROPONINI in the last 168 hours. BNP (last 3 results) No results for input(s): PROBNP in the last 8760 hours. HbA1C: Recent Labs    06/24/19 0603  HGBA1C 7.1*   CBG: No results for input(s): GLUCAP in the last 168 hours. Lipid Profile: No results for input(s): CHOL, HDL, LDLCALC, TRIG, CHOLHDL, LDLDIRECT in the last 72 hours. Thyroid Function Tests: Recent Labs    06/24/19 0603  TSH 1.435   Anemia Panel: No results for input(s): VITAMINB12, FOLATE, FERRITIN, TIBC, IRON, RETICCTPCT in the last 72 hours. Urine analysis:    Component Value Date/Time   COLORURINE AMBER (A) 06/23/2019 1941   APPEARANCEUR TURBID (A) 06/23/2019 1941   LABSPEC 1.017 06/23/2019 1941   PHURINE 5.0 06/23/2019 1941   GLUCOSEU 150 (A) 06/23/2019 1941   HGBUR SMALL (A) 06/23/2019 1941   BILIRUBINUR NEGATIVE 06/23/2019 1941   KETONESUR 5 (A) 06/23/2019 1941   PROTEINUR 100 (A) 06/23/2019 1941   UROBILINOGEN 0.2 07/31/2014 0235   NITRITE NEGATIVE 06/23/2019 1941   LEUKOCYTESUR LARGE (A) 06/23/2019 1941   Sepsis Labs: @LABRCNTIP (procalcitonin:4,lacticidven:4) ) Recent Results (from the past 240 hour(s))  SARS Coronavirus 2 [facility] order, Performed in [facility] hospital lab) Nasopharyngeal Nasopharyngeal Swab     Status: None   Collection Time: 06/24/19  3:05 AM   Specimen: Nasopharyngeal Swab  Result Value Ref Range Status   SARS Coronavirus 2 NEGATIVE NEGATIVE Final    Comment: (NOTE) If result is NEGATIVE SARS-CoV-2 target nucleic acids are NOT DETECTED. The SARS-CoV-2 RNA is generally detectable in upper and lower  respiratory specimens during the acute phase of infection. The lowest  concentration of SARS-CoV-2 viral copies this assay can detect is 250  copies / mL. A negative result does not preclude  SARS-CoV-2 infection  and should not be used as the sole basis for treatment or other  patient management decisions.  A negative result may occur with  improper specimen collection / handling, submission of specimen other  than nasopharyngeal swab, presence of viral mutation(s) within the  areas  targeted by this assay, and inadequate number of viral copies  (<250 copies / mL). A negative result must be combined with clinical  observations, patient history, and epidemiological information. If result is POSITIVE SARS-CoV-2 target nucleic acids are DETECTED. The SARS-CoV-2 RNA is generally detectable in upper and lower  respiratory specimens dur ing the acute phase of infection.  Positive  results are indicative of active infection with SARS-CoV-2.  Clinical  correlation with patient history and other diagnostic information is  necessary to determine patient infection status.  Positive results do  not rule out bacterial infection or co-infection with other viruses. If result is PRESUMPTIVE POSTIVE SARS-CoV-2 nucleic acids MAY BE PRESENT.   A presumptive positive result was obtained on the submitted specimen  and confirmed on repeat testing.  While 2019 novel coronavirus  (SARS-CoV-2) nucleic acids may be present in the submitted sample  additional confirmatory testing may be necessary for epidemiological  and / or clinical management purposes  to differentiate between  SARS-CoV-2 and other Sarbecovirus currently known to infect humans.  If clinically indicated additional testing with an alternate test  methodology [phone] is advised. The SARS-CoV-2 RNA is generally  detectable in upper and lower respiratory sp ecimens during the acute  phase of infection. The expected result is Negative. Fact Sheet for Patients:  BoilerBrush.com.cyhttps://www.fda.gov/media/136312/download Fact Sheet for Healthcare Providers: https://pope.com/https://www.fda.gov/media/136313/download This test is not yet approved or cleared by the  [place] FDA and has been authorized for detection and/or diagnosis of SARS-CoV-2 by FDA under an Emergency Use Authorization (EUA).  This EUA will remain in effect (meaning this test can be used) for the duration of the COVID-19 declaration under Section 564(b)(1) of the Act, 21 U.S.C. section 360bbb-3(b)(1), unless the authorization is terminated or revoked sooner. Performed at [facility], [address] [place], [place] [address]      Radiological Exams on Admission: Dg Chest Port 1 View  Result Date: 06/23/2019 CLINICAL DATA:  Initial evaluation for acute fever, epigastric pain. EXAM: PORTABLE CHEST 1 VIEW COMPARISON:  Prior radiograph from 07/31/2014. FINDINGS: Median sternotomy wires underlying surgical clips and CABG markers noted. Cardiomegaly, stable. Mediastinal silhouette normal. Aortic atherosclerosis. Lungs mildly hypoinflated. Perihilar vascular congestion without overt pulmonary edema. No pleural effusion. No consolidative airspace opacity. No pneumothorax. Few scattered calcified granulomata noted. No acute osseous abnormality. Soft tissue calcifications overlie the left neck and chest. IMPRESSION: 1. Stable cardiomegaly with mild perihilar vascular congestion without overt pulmonary edema. 2. No other active cardiopulmonary disease. Electronically Signed   By: Rise MuBenjamin  McClintock M.D.   On: 06/23/2019 23:25   [place] Abdomen Limited Ruq  Result Date: 06/24/2019 CLINICAL DATA:  Right upper quadrant pain. EXAM: ULTRASOUND ABDOMEN LIMITED RIGHT UPPER QUADRANT COMPARISON:  None. FINDINGS: Gallbladder: Distended with multiple gallstones. Borderline wall thickening of 3-4 mm. No pericholecystic fluid. No sonographic Murphy sign noted by sonographer. Common bile duct: Diameter: 3-4 mm, normal. Liver: No focal lesion identified. Within normal limits in parenchymal echogenicity. Portal vein is patent on color Doppler imaging with normal direction of blood flow towards the liver.  Other: None. IMPRESSION: 1. Gallstones with borderline wall thickening of 3-4 mm. Consider nuclear medicine HIDA scan if there is clinical concern for acute cholecystitis. No sonographic Murphy sign or pericholecystic fluid. 2. No biliary dilatation. Electronically Signed   By: Narda RutherfordMelanie  Sanford M.D.   On: 06/24/2019 01:20     EKG: Independently reviewed.  Sinus rhythm, QTC 435, LAE, IVCD, anteroseptal infarction pattern, T wave inversion in lateral  leads and V4-V6. Assessment/Plan Principal Problem:   Abdominal pain Active Problems:   Vitamin B12 deficiency   Chronic diastolic CHF (congestive heart failure), NYHA class 2 (HCC)   Hyperlipidemia with target LDL less than 70   CAD (coronary artery disease) of artery bypass graft   CKD (chronic kidney disease), stage III (HCC)   UTI (urinary tract infection)   Hyponatremia   Type II diabetes mellitus with renal manifestations (HCC)   Sepsis (HCC)   Hypothyroidism   Abdominal pain-RUQ: liver function is normal. US-RUQ showed gallstones with borderline wall thickening of 3-4 mm. No sonographic Murphy sign or pericholecystic fluid. No biliary dilatation. Lipase normal 25. Dr. Donell BeersByerly of general surgeon was consulted.  -will admit to tele bed as inpt -Abx: rocephin and flagyl (also for UTI) -PRN Zofran for nausea, morphine for pain -f/u Bx  -f/u nuclear medicine HIDA scan -Follow-up general surgeons recommendation  UTI (urinary tract infection) and sepsis: Patient meets criteria for sepsis with leukocytosis and tachypnea (RR 22). Lactic acid normal 1.8 -Abx: rocephin and flagyl as above -f/u Bx and Ux -IVF: NS at 75 cc/h  Vitamin B12 deficiency: -continue Vb12  Chronic diastolic CHF (congestive heart failure), NYHA class 2 (HCC): 2D echo on 03/15/2010 showed EF of 45-50%.  Patient does not have leg edema.  No JVD.  CHF seem to be compensated. -Hold Lasix due to sepsis and hyponatremia -Check BNP  Hyperlipidemia with target LDL less  than 70: -crestor  CAD (coronary artery disease) of artery bypass graft: No CP -continue Crestor, Coreg -Hold Plavix in case patient needs surgery  CKD (chronic kidney disease), stage III [facility](HCC): Patient had a creatinine 2.09 on 09/02/2014.  Her creatinine is 1.36, BUN 22, renal function stable -f/u by BMP  Type II diabetes mellitus with renal manifestations: Last A1c 6.1 on 07/31/14, well controled then. Patient is taking Tradjenta, glipizide-metformin at home -SSI -Check A1c  Hyponatremia: Most likely due to poor oral intake and dehydration and continuation of lasix - Will check urine sodium, urine osmolality, serum osmolality. - check TSH - hold lasix -  IVF: NS 75 mL/h - f/u by BMP  Hypothyroidism: -Continue Synthroid    Inpatient status:  # Patient requires inpatient status due to high intensity of service, high risk for further deterioration and high frequency of surveillance required.  I certify that at the point of admission it is my clinical judgment that the patient will require inpatient hospital care spanning beyond 2 midnights from the point of admission.  . This patient has multiple chronic comorbidities including  hyperlipidemia, diabetes mellitus, hypothyroidism, vitamin B12 deficiency, PVD, pulmonary hypertension, CAD, CABG, sCHF with EF of 45%, CKD stage III . Now patient has presenting with RUQ-abdominal pain, UTI, sepsis, hyponatremia . The worrisome physical exam findings include tenderness over RUQ abdomen. . The initial radiographic and laboratory data are worrisome because of leukocytosis, sepsis, hyponatremia. RUQ-US showed gallstones with borderline wall thickening of 3-4 mm. . Current medical needs: please see my assessment and plan . Predictability of an adverse outcome (risk): Patient has multiple comorbidities, now presents with RUQ-abdominal pain, UTI, sepsis. RUQ-US showed gallstones with borderline wall thickening of 3-4 mm , will need to r/o  cholecystitis by HIDA scan.  If proved to have cholecystitis, patient may need surgery.  Patient's presentation is highly complicated. Pt is at high risk for deteriorating.  Will need to be treated in hospital for at least 2 days.     DVT ppx: SCD Code Status: Full  code Family Communication: Yes, patient's daughter by phone Disposition Plan:  Anticipate discharge back to previous home environment Consults called:  Dr. Donell Beers of general surgeon Admission status:  Inpatient/tele         Date of Service 06/24/2019    Lorretta Harp [facility]   If 7PM-7AM, please contact night-coverage [email or web address] Password [facility] 06/24/2019, 7:28 AM

## 2019-06-24 NOTE — ED Notes (Signed)
Patient transported to Ultrasound 

## 2019-06-24 NOTE — Plan of Care (Signed)
  Problem: Education: Goal: Knowledge of General Education information will improve Description: Including pain rating scale, medication(s)/side effects and non-pharmacologic comfort measures Outcome: Progressing   Problem: Health Behavior/Discharge Planning: Goal: Ability to manage health-related needs will improve Outcome: Progressing   Problem: Clinical Measurements: Goal: Ability to maintain clinical measurements within normal limits will improve Outcome: Progressing Goal: Respiratory complications will improve Outcome: Progressing Goal: Cardiovascular complication will be avoided Outcome: Progressing   Problem: Activity: Goal: Risk for activity intolerance will decrease Outcome: Progressing   Problem: Coping: Goal: Level of anxiety will decrease Outcome: Progressing   Problem: Elimination: Goal: Will not experience complications related to urinary retention Outcome: Progressing   Problem: Pain Managment: Goal: General experience of comfort will improve Outcome: Progressing   Problem: Safety: Goal: Ability to remain free from injury will improve Outcome: Progressing   Problem: Skin Integrity: Goal: Risk for impaired skin integrity will decrease Outcome: Progressing   

## 2019-06-24 NOTE — Progress Notes (Signed)
Writer called and spoke to Dr.  Thornton Papas and informed MD of patients BP. Per Dr. Thornton Papas, cancel morphine and complete delayed images. [facility] staff made aware. PAtient stable.

## 2019-06-24 NOTE — Progress Notes (Addendum)
PROGRESS NOTE    Margaret [name]  WUJ:[id number] DOB: 11/10/1933 DOA: 06/23/2019 PCP: Ileana LaddWong, Francis P, MD   Brief Narrative:  Margaret [name] is a 85 y.o. female with medical history significant of hyperlipidemia, diabetes mellitus, hypothyroidism, vitamin B12 deficiency, PVD, pulmonary hypertension, CAD, CABG, sCHF with EF of 45%, CKD stage III, who presents with RUQ abdominal pain. Pt speaks gujarati and hindi, and little [place]. The history is obtained with her daughter's help. Per her daughter, patient started having chest pain since yesterday, which is located in the right upper quadrant, constant, sharp, radiating to the back.  Patient does not have nausea vomiting or diarrhea.  Patient had mild fever of 100.  Patient was seen in urgent care, and was sent to ED for further evaluation treatment.  Patient does not have chest pain, cough, shortness of breath.  Patient has mild discomfort on urination. No unilateral weakness. In ED pt was found to have WBC 15.7, positive urinalysis (turbid appearance, large amount of leukocyte, many bacteria, WBC>50), pending COVID-19 test, lactic acid 1.8, stable renal function, sodium 128, currently temperature 99, blood pressure 138/82, heart rate 80 to 90s, oxygen sat 98% on room air.  Chest x-ray showed stable cardiomegaly with mild perihilar vascular congestion.     Assessment & Plan:   Principal Problem:   Abdominal pain Active Problems:   Vitamin B12 deficiency   Chronic diastolic CHF (congestive heart failure), NYHA class 2 (HCC)   Hyperlipidemia with target LDL less than 70   CAD (coronary artery disease) of artery bypass graft   CKD (chronic kidney disease), stage III (HCC)   UTI (urinary tract infection)   Hyponatremia   Type II diabetes mellitus with renal manifestations (HCC)   Sepsis (HCC)   Hypothyroidism   Acute onset intractable abdominal pain, POA, resolved Rule out gastritis, peptic ulcer disease, gastroenteritis:  -Patient poor historian,  unclear if right upper quadrant versus epigastric pain, regardless it is now resolved  -Liver function is normal.  -US-RUQ showed gallstones with borderline wall thickening of 3-4 mm. No sonographic Murphy sign or pericholecystic fluid. No biliary dilatation. Lipase normal 25.  -Surgery following, appreciate insight recommendations, currently pending HIDA scan -Pending further imaging will likely dictate patient's course -Patient remains n.p.o. for possible need for surgical intervention -Should patient's HIDA scan be unremarkable or negative will discuss with surgery to advance diet and follow along clinically -Patient has had upper EGD per lengthy discussion this morning via translator but cannot recall why she underwent upper endoscopy; questionable peptic ulcer disease/gastritis history could explain her symptoms as above -Currently on Rocephin and Flagyl for coverage of questionable abdominal -Abx: rocephin and flagyl (cover UTI as below) -PRN Zofran for nausea, morphine for pain -Follow cultures  Sepsis secondary to UTI (urinary tract infection)  POA:  -Patient meets criteria for sepsis with leukocytosis and tachypnea (RR 22).  -Abx: rocephin and flagyl as above -Cultures, currently preliminary negative -Continue IV fluids, sepsis criteria resolving given no longer tachypneic, tachycardic, leukocytosis downtrending  Chronic diastolic CHF (congestive heart failure), NYHA class 2 (HCC), not in acute exacerbation:  - Echo on 03/15/2010 showed EF of 45-50%.   -Appears euvolemic -Continue IV fluids given n.p.o. -Holding Lasix peri-procedure -we will resume once patient begins taking p.o.  Hyperlipidemia with target LDL less than 70: -Continue home crestor  CAD (coronary artery disease) of artery bypass graft: -continue Crestor, Coreg -Hold Plavix in case patient needs surgery -Questionable stenting -at bedside this morning patient stated that she had recalled  cardiology placing  something in her heart years ago confirmed the word "stent" was used.  CKD (chronic kidney disease), stage III [facility](HCC):  Patient had a creatinine 2.09 on 09/02/2014.   Her creatinine is 1.36, BUN 22, renal function stable Follow labs  Type II diabetes mellitus with renal manifestations, moderately well controlled: Last A1c 6.1 on 07/31/14, well controled then.  Home meds include-Tradjenta, glipizide-metformin -SSI - A1c 7.1  Hyponatremia, likely hypovolemic in nature given diuretics:  -Currently holding Lasix - follow morning labs, -TSH within normal limits  Hypothyroidism: -Continue Synthroid  DVT prophylaxis: SCDs, perioperatively Code Status: Full Family Communication: Attempted to call patient's son x3, no answer Disposition Plan: Pending clinical course, possible need for further imaging, surgical evaluation and intervention.  Likely disposition home after resolution of abdominal pain and further work-up  Consultants:   Surgery  Procedures:   None planned  Antimicrobials:  Rocephin, Flagyl   Subjective: No acute issues or events overnight, via translator patient declines any chest pain, shortness of breath, nausea, vomiting, diarrhea, constipation, headache, fever, chills.  Objective: Vitals:   06/24/19 0530 06/24/19 0600 06/24/19 0652 06/24/19 0800  BP: (!) 122/57 (!) 113/56 (!) 114/49 (!) 117/48  Pulse: 96 95 91 88  Resp:   20   Temp:   98.4 F (36.9 C)   TempSrc:   Oral   SpO2: 97% 99% 100%   Weight:   57.6 kg   Height:   5' (1.524 m)     Intake/Output Summary (Last 24 hours) at 06/24/2019 1344 Last data filed at 06/24/2019 0900 Gross per 24 hour  Intake 0 ml  Output --  Net 0 ml   Filed Weights   06/23/19 1720 06/24/19 0652  Weight: 63 kg 57.6 kg    Examination:  General:  Pleasantly resting in bed, No acute distress. HEENT:  Normocephalic atraumatic.  Sclerae nonicteric, noninjected.  Extraocular movements intact bilaterally. Neck:  Without  mass or deformity.  Trachea is midline. Lungs:  Clear to auscultate bilaterally without rhonchi, wheeze, or rales. Heart:  Regular rate and rhythm.  Without murmurs, rubs, or gallops. Abdomen:  Soft, nontender, nondistended.  Without guarding or rebound.  Negative Murphy's, negative epigastric pain with deep palpation Extremities: Without cyanosis, clubbing, edema, or obvious deformity. Vascular:  Dorsalis pedis and posterior tibial pulses palpable bilaterally. Skin:  Warm and dry, no erythema, no ulcerations.  Data Reviewed: I have personally reviewed following labs and imaging studies  CBC: Recent Labs  Lab 06/23/19 1728 06/24/19 0603  WBC 15.7* 12.8*  HGB 11.9* 11.2*  HCT 37.3 35.5*  MCV 94.9 95.2  PLT 162 137*   Basic Metabolic Panel: Recent Labs  Lab 06/23/19 1728 06/24/19 0603  NA 128* 133*  K 4.0 4.0  CL 92* 99  CO2 24 22  GLUCOSE 302* 184*  BUN 22 25*  CREATININE 1.36* 1.33*  CALCIUM 8.9 8.4*   GFR: Estimated Creatinine Clearance: 25.4 mL/min (A) (by C-G formula based on SCr of 1.33 mg/dL (H)). Liver Function Tests: Recent Labs  Lab 06/23/19 1728  AST 19  ALT 16  ALKPHOS 59  BILITOT 1.1  PROT 7.3  ALBUMIN 3.2*   Recent Labs  Lab 06/23/19 1728  LIPASE 25   No results for input(s): AMMONIA in the last 168 hours. Coagulation Profile: No results for input(s): INR, PROTIME in the last 168 hours. Cardiac Enzymes: No results for input(s): CKTOTAL, CKMB, CKMBINDEX, TROPONINI in the last 168 hours. BNP (last 3 results) No results for input(s):  PROBNP in the last 8760 hours. HbA1C: Recent Labs    06/24/19 0603  HGBA1C 7.1*   CBG: Recent Labs  Lab 06/24/19 0857  GLUCAP 172*   Lipid Profile: No results for input(s): CHOL, HDL, LDLCALC, TRIG, CHOLHDL, LDLDIRECT in the last 72 hours. Thyroid Function Tests: Recent Labs    06/24/19 0603  TSH 1.435   Anemia Panel: No results for input(s): VITAMINB12, FOLATE, FERRITIN, TIBC, IRON, RETICCTPCT in  the last 72 hours. Sepsis Labs: Recent Labs  Lab 06/23/19 2345  LATICACIDVEN 1.8    Recent Results (from the past 240 hour(s))  SARS Coronavirus 2 [facility] order, Performed in [facility] hospital lab) Nasopharyngeal Nasopharyngeal Swab     Status: None   Collection Time: 06/24/19  3:05 AM   Specimen: Nasopharyngeal Swab  Result Value Ref Range Status   SARS Coronavirus 2 NEGATIVE NEGATIVE Final    Comment: (NOTE) If result is NEGATIVE SARS-CoV-2 target nucleic acids are NOT DETECTED. The SARS-CoV-2 RNA is generally detectable in upper and lower  respiratory specimens during the acute phase of infection. The lowest  concentration of SARS-CoV-2 viral copies this assay can detect is 250  copies / mL. A negative result does not preclude SARS-CoV-2 infection  and should not be used as the sole basis for treatment or other  patient management decisions.  A negative result may occur with  improper specimen collection / handling, submission of specimen other  than nasopharyngeal swab, presence of viral mutation(s) within the  areas targeted by this assay, and inadequate number of viral copies  (<250 copies / mL). A negative result must be combined with clinical  observations, patient history, and epidemiological information. If result is POSITIVE SARS-CoV-2 target nucleic acids are DETECTED. The SARS-CoV-2 RNA is generally detectable in upper and lower  respiratory specimens dur ing the acute phase of infection.  Positive  results are indicative of active infection with SARS-CoV-2.  Clinical  correlation with patient history and other diagnostic information is  necessary to determine patient infection status.  Positive results do  not rule out bacterial infection or co-infection with other viruses. If result is PRESUMPTIVE POSTIVE SARS-CoV-2 nucleic acids MAY BE PRESENT.   A presumptive positive result was obtained on the submitted specimen  and confirmed on repeat testing.  While  2019 novel coronavirus  (SARS-CoV-2) nucleic acids may be present in the submitted sample  additional confirmatory testing may be necessary for epidemiological  and / or clinical management purposes  to differentiate between  SARS-CoV-2 and other Sarbecovirus currently known to infect humans.  If clinically indicated additional testing with an alternate test  methodology [phone]) is advised. The SARS-CoV-2 RNA is generally  detectable in upper and lower respiratory sp ecimens during the acute  phase of infection. The expected result is Negative. Fact Sheet for Patients:  BoilerBrush.com.cy Fact Sheet for Healthcare Providers: https://pope.com/ This test is not yet approved or cleared by the [place] FDA and has been authorized for detection and/or diagnosis of SARS-CoV-2 by FDA under an Emergency Use Authorization (EUA).  This EUA will remain in effect (meaning this test can be used) for the duration of the COVID-19 declaration under Section 564(b)(1) of the Act, 21 U.S.C. section 360bbb-3(b)(1), unless the authorization is terminated or revoked sooner. Performed at [facility], [address] [place], [place] [address]          Radiology Studies: Dg Chest Port 1 View  Result Date: 06/23/2019 CLINICAL DATA:  Initial evaluation for  acute fever, epigastric pain. EXAM: PORTABLE CHEST 1 VIEW COMPARISON:  Prior radiograph from 07/31/2014. FINDINGS: Median sternotomy wires underlying surgical clips and CABG markers noted. Cardiomegaly, stable. Mediastinal silhouette normal. Aortic atherosclerosis. Lungs mildly hypoinflated. Perihilar vascular congestion without overt pulmonary edema. No pleural effusion. No consolidative airspace opacity. No pneumothorax. Few scattered calcified granulomata noted. No acute osseous abnormality. Soft tissue calcifications overlie the left neck and chest. IMPRESSION: 1. Stable cardiomegaly with mild  perihilar vascular congestion without overt pulmonary edema. 2. No other active cardiopulmonary disease. Electronically Signed   By: Jeannine Boga M.D.   On: 06/23/2019 23:25   US Abdomen Limited Ruq  Result Date: 06/24/2019 CLINICAL DATA:  Right upper quadrant pain. EXAM: ULTRASOUND ABDOMEN LIMITED RIGHT UPPER QUADRANT COMPARISON:  None. FINDINGS: Gallbladder: Distended with multiple gallstones. Borderline wall thickening of 3-4 mm. No pericholecystic fluid. No sonographic Murphy sign noted by sonographer. Common bile duct: Diameter: 3-4 mm, normal. Liver: No focal lesion identified. Within normal limits in parenchymal echogenicity. Portal vein is patent on color Doppler imaging with normal direction of blood flow towards the liver. Other: None. IMPRESSION: 1. Gallstones with borderline wall thickening of 3-4 mm. Consider nuclear medicine HIDA scan if there is clinical concern for acute cholecystitis. No sonographic Murphy sign or pericholecystic fluid. 2. No biliary dilatation. Electronically Signed   By: Keith Rake M.D.   On: 06/24/2019 01:20        Scheduled Meds:  carvedilol  12.5 mg Oral BID WC   insulin aspart  0-5 Units Subcutaneous QHS   insulin aspart  0-9 Units Subcutaneous TID WC   [START ON 06/25/2019] levothyroxine  125 mcg Oral QAC breakfast    morphine injection  2.3 mg Intravenous Once   morphine       rosuvastatin  10 mg Oral QPC supper   sodium chloride flush  3 mL Intravenous Once   vitamin B-12  5,000 mcg Oral Daily   Continuous Infusions:  sodium chloride 75 mL/hr at 06/24/19 0658   cefTRIAXone (ROCEPHIN)  IV     metronidazole 500 mg (06/24/19 0955)     LOS: 0 days   Time spent: 13min  Shelsie Tijerino C Carianna Lague, DO [facility]  If 7PM-7AM, please contact night-coverage [email or web address] Password [email or web address] 06/24/2019, 1:44 PM

## 2019-06-24 NOTE — Progress Notes (Signed)
Pt returned to [id number] from Nuclear Medicine. Will continue to monitor.

## 2019-06-24 NOTE — ED Notes (Signed)
Admitting provider bedside 

## 2019-06-24 NOTE — Consult Note (Signed)
Margaret NasutiSudha [name] 02/15/1934  [id number].    Requesting MD: Dr. Lorretta HarpXilin Niu Chief Complaint/Reason for Consult: abdominal pain, gallstones  HPI:  This is an 85 yo [place] female with a complex PMH including DM, HTN, pulmonary HTN, PVD, CAD, s/p CABG, CHF with EF of 45%, and CKD stage III who is a poor historian even via interpreter so limited history is obtained.  We tried a Hindi interpreter but she states she doesn't understand him.  We switched to a [place] interpreter and this seemed to work a little better.  At one point, she started speaking Hindi to the [place] interpreter and no one could understand the other.  Ultimately it sounds like she started having some pain in her "chest" yesterday.  She is unable to describe the pain and the nature of it.  All she states is that in [place] she has no problem with food but here in [place] "they feed me at 10 at night and then I have pain when I go to sleep."  She apparently has had an EGD in the past, but there is no information for what this was for or what that showed.  She did reveal that she had some nausea yesterday, but no further.  Her pain is currently gone today.  She states that food does not seem to make her pain worse.    She was brought to the ED where she was found to have a UTI.  She had an US of her abdomen that revealed some gallstones, questionable gb wall thickening of 3-374mm, but a negative Murphy's sign.  She has been admitted and we have been asked to evaluate her for her gallbladder.  ROS: ROS: unable to obtained secondary to language barrier  Family History  Problem Relation Age of Onset  . Diabetes Father   . Ulcers Other   . Diabetes Other   . Arthritis Other   . Diabetes Other   . Heart attack Neg Hx     Past Medical History:  Diagnosis Date  . Acute on chronic renal failure (HCC) 07/31/2014  . Acute pulmonary edema with congestive heart failure (HCC) 07/17/2014  . Acute renal insufficiency 07/19/2014  . Anemia   .  ARF (acute renal failure) (HCC) 07/31/2014  . Atherosclerotic peripheral vascular disease with intermittent claudication (HCC) 04/2014   LEA Dopplers: RABI 1.02/ LABI: 0.61 (L SFA, Pop & DP blunted wave forms) -- correlates with L leg claudication  . Atrophic vaginitis 04/2013  . CAD (coronary artery disease)    a. prior PCI LAD 2005. b. CABG 2011.  Marland Kitchen. Chronic diastolic CHF (congestive heart failure) (HCC)   . CKD (chronic kidney disease), stage III (HCC)   . Hx of CABG   . Hx of echocardiogram 05/2014   EF 50-55%, inf-lat HK, Gr 2 DD, very mild AS (mean 10 mmHg), mild MR, PASP 36 mmHg; calcified density in LA (suggest TEE)  . Hyperlipidemia with target LDL less than 70   . Mitral regurgitation   . PAD (peripheral artery disease) (HCC)   . Pulmonary hypertension (HCC)   . RESPIRATORY ARREST assoc w [facility]   . Thyroid disease   . Tricuspid regurgitation   . Type 2 diabetes mellitus (HCC)   . Vitamin B12 deficiency     Past Surgical History:  Procedure Laterality Date  . CARDIAC CATHETERIZATION  2005   LAD stent occluded  . CARDIAC CATHETERIZATION  03/2010    Ostial left main 60, OM1 70,  circumflex 50-60, mid LAD stent occluded, mid RCA 30-40, RV marginal 90, EF 45-50%. >>> CABG  . CORONARY ARTERY BYPASS GRAFT  03/2010   LIMA-LAD, SVG-D1, seqSVG-OM1-OM3  . LEFT HEART CATHETERIZATION WITH CORONARY/GRAFT ANGIOGRAM N/A 07/22/2014   Procedure: LEFT HEART CATHETERIZATION WITH Isabel Caprice;  Surgeon: Iran Ouch, MD;  Location: [facility]ATH [facility];  Service: Cardiovascular;  Laterality: N/A;  . TOTAL KNEE ARTHROPLASTY      Social History:  reports that she has never smoked. She has never used smokeless tobacco. She reports that she does not drink alcohol or use drugs.  Allergies:  Allergies  Allergen Reactions  . Myoview [Technetium-50m] Shortness Of Breath  . Penicillins Other (See Comments)    Unknown allergic reaction     Medications Prior to Admission  Medication Sig  Dispense Refill  . carvedilol (COREG) 12.5 MG tablet Take 1 tablet (12.5 mg total) by mouth 2 (two) times daily with a meal. 60 tablet 6  . clopidogrel (PLAVIX) 75 MG tablet Take 75 mg by mouth daily.    . Cyanocobalamin (VITAMIN B-12) 2500 MCG TABS Take 5,000 mcg by mouth daily. 30 tablet 0  . furosemide (LASIX) 40 MG tablet Take 40 mg by mouth every morning.    . gabapentin (NEURONTIN) 100 MG capsule 100 mg, 3 times a day as needed for nerve pain 90 capsule 3  . glipiZIDE-metformin (METAGLIP) 5-500 MG per tablet Take 1 tablet by mouth 2 (two) times daily before a meal.    . levothyroxine (SYNTHROID, LEVOTHROID) 125 MCG tablet Take 1 tablet (125 mcg total) by mouth daily before breakfast. 30 tablet 3  . linagliptin (TRADJENTA) 5 MG TABS tablet Take 5 mg by mouth every morning.    . rosuvastatin (CRESTOR) 10 MG tablet Take 1 tablet (10 mg total) by mouth daily after supper. Rosavel from [place] 30 tablet 6     Physical Exam: Blood pressure (!) 117/48, pulse 88, temperature 98.4 F (36.9 C), temperature source Oral, resp. rate 20, height 5' (1.524 m), weight 57.6 kg, SpO2 100 %. General: pleasant, [place] female who is laying in bed in NAD HEENT: head is normocephalic, atraumatic.  Sclera are noninjected.  PERRL.  Ears and nose without any masses or lesions.  Mouth is pink and moist Heart: regular, rate, and rhythm.  Normal s1,s2. No obvious gallops, or rubs noted. +murmur.  Palpable radial and pedal pulses bilaterally Lungs: CTAB, no wheezes, rhonchi, or rales noted.  Respiratory effort nonlabored Abd: soft, NT, ND, +BS, no masses, hernias, or organomegaly MS: all 4 extremities are symmetrical with no cyanosis, clubbing.  Trace BLE edema Skin: warm and dry with no masses, lesions, or rashes Psych: A&Ox3 as best as I can tell.  Appropriate affect.   Results for orders placed or performed during the hospital encounter of 06/23/19 (from the past 48 hour(s))  Lipase, blood     Status: None    Collection Time: 06/23/19  5:28 PM  Result Value Ref Range   Lipase 25 11 - 51 U/L    Comment: Performed at [facility], [address], [place], [place] [address]  Comprehensive metabolic panel     Status: Abnormal   Collection Time: 06/23/19  5:28 PM  Result Value Ref Range   Sodium 128 (L) 135 - 145 mmol/L   Potassium 4.0 3.5 - 5.1 mmol/L   Chloride 92 (L) 98 - 111 mmol/L   CO2 24 22 - 32 mmol/L   Glucose, Bld 302 (H) 70 -  99 mg/dL   BUN 22 8 - 23 mg/dL   Creatinine, Ser 1.36 (H) 0.44 - 1.00 mg/dL   Calcium 8.9 8.9 - 10.3 mg/dL   Total Protein 7.3 6.5 - 8.1 g/dL   Albumin 3.2 (L) 3.5 - 5.0 g/dL   AST 19 15 - 41 U/L   ALT 16 0 - 44 U/L   Alkaline Phosphatase 59 38 - 126 U/L   Total Bilirubin 1.1 0.3 - 1.2 mg/dL   GFR calc non Af Amer 36 (L) >60 mL/min   GFR calc Af Amer 42 (L) >60 mL/min   Anion gap 12 5 - 15    Comment: Performed at [facility] [address], [place], [place] [address]  CBC     Status: Abnormal   Collection Time: 06/23/19  5:28 PM  Result Value Ref Range   WBC 15.7 (H) 4.0 - 10.5 K/uL   RBC 3.93 3.87 - 5.11 MIL/uL   Hemoglobin 11.9 (L) 12.0 - 15.0 g/dL   HCT 37.3 36.0 - 46.0 %   MCV 94.9 80.0 - 100.0 fL   MCH 30.3 26.0 - 34.0 pg   MCHC 31.9 30.0 - 36.0 g/dL   RDW 13.4 11.5 - 15.5 %   Platelets 162 150 - 400 K/uL   nRBC 0.0 0.0 - 0.2 %    Comment: Performed at [facility], [place] [address], [place], [place] [address]  Urinalysis, Routine w reflex microscopic     Status: Abnormal   Collection Time: 06/23/19  7:41 PM  Result Value Ref Range   Color, Urine AMBER (A) YELLOW    Comment: BIOCHEMICALS MAY BE AFFECTED BY COLOR   APPearance TURBID (A) CLEAR   Specific Gravity, Urine 1.017 1.005 - 1.030   pH 5.0 5.0 - 8.0   Glucose, UA 150 (A) NEGATIVE mg/dL   Hgb urine dipstick SMALL (A) NEGATIVE   Bilirubin Urine NEGATIVE NEGATIVE   Ketones, ur 5 (A) NEGATIVE mg/dL   Protein, ur 100 (A) NEGATIVE mg/dL   Nitrite NEGATIVE NEGATIVE    Leukocytes,Ua LARGE (A) NEGATIVE   RBC / HPF 0-5 0 - 5 RBC/hpf   WBC, UA >50 (H) 0 - 5 WBC/hpf   Bacteria, UA MANY (A) NONE SEEN    Comment: Performed at [facility], [address] [place], [place] [address]  Lactic acid, plasma     Status: None   Collection Time: 06/23/19 11:45 PM  Result Value Ref Range   Lactic Acid, Venous 1.8 0.5 - 1.9 mmol/L    Comment: Performed at [facility] [address] [place],  [address]  SARS Coronavirus 2 [facility] order, Performed in [facility] hospital lab) Nasopharyngeal Nasopharyngeal Swab     Status: None   Collection Time: 06/24/19  3:05 AM   Specimen: Nasopharyngeal Swab  Result Value Ref Range   SARS Coronavirus 2 NEGATIVE NEGATIVE    Comment: (NOTE) If result is NEGATIVE SARS-CoV-2 target nucleic acids are NOT DETECTED. The SARS-CoV-2 RNA is generally detectable in upper and lower  respiratory specimens during the acute phase of infection. The lowest  concentration of SARS-CoV-2 viral copies this assay can detect is 250  copies / mL. A negative result does not preclude SARS-CoV-2 infection  and should not be used as the sole basis for treatment or other  patient management decisions.  A negative result may occur with  improper specimen collection / handling, submission of specimen other  than nasopharyngeal swab,  presence of viral mutation(s) within the  areas targeted by this assay, and inadequate number of viral copies  (<250 copies / mL). A negative result must be combined with clinical  observations, patient history, and epidemiological information. If result is POSITIVE SARS-CoV-2 target nucleic acids are DETECTED. The SARS-CoV-2 RNA is generally detectable in upper and lower  respiratory specimens dur ing the acute phase of infection.  Positive  results are indicative of active infection with SARS-CoV-2.  Clinical  correlation with patient history and other diagnostic information is  necessary to determine  patient infection status.  Positive results do  not rule out bacterial infection or co-infection with other viruses. If result is PRESUMPTIVE POSTIVE SARS-CoV-2 nucleic acids MAY BE PRESENT.   A presumptive positive result was obtained on the submitted specimen  and confirmed on repeat testing.  While 2019 novel coronavirus  (SARS-CoV-2) nucleic acids may be present in the submitted sample  additional confirmatory testing may be necessary for epidemiological  and / or clinical management purposes  to differentiate between  SARS-CoV-2 and other Sarbecovirus currently known to infect humans.  If clinically indicated additional testing with an alternate test  methodology [phone]([id number]) is advised. The SARS-CoV-2 RNA is generally  detectable in upper and lower respiratory sp ecimens during the acute  phase of infection. The expected result is Negative. Fact Sheet for Patients:  BoilerBrush.com.cyhttps://www.fda.gov/media/136312/download Fact Sheet for Healthcare Providers: https://pope.com/https://www.fda.gov/media/136313/download This test is not yet approved or cleared by the [place] FDA and has been authorized for detection and/or diagnosis of SARS-CoV-2 by FDA under an Emergency Use Authorization (EUA).  This EUA will remain in effect (meaning this test can be used) for the duration of the COVID-19 declaration under Section 564(b)(1) of the Act, 21 U.S.C. section 360bbb-3(b)(1), unless the authorization is terminated or revoked sooner. Performed at [facility], [address] [place], [place] [address]   Brain natriuretic peptide     Status: Abnormal   Collection Time: 06/24/19  6:03 AM  Result Value Ref Range   B Natriuretic Peptide 414.2 (H) 0.0 - 100.0 pg/mL    Comment: Performed at [facility], [address] [place], [place] [address]  Osmolality     Status: None   Collection Time: 06/24/19  6:03 AM  Result Value Ref Range   Osmolality 288 275 - 295 mOsm/kg    Comment: Performed at [facility], [address] [place], [place] [address]  TSH     Status: None   Collection Time: 06/24/19  6:03 AM  Result Value Ref Range   TSH 1.435 0.350 - 4.500 uIU/mL    Comment: Performed by a 3rd Generation assay with a functional sensitivity of <=0.01 uIU/mL. Performed at [facility], [address] [place], [place] [address]   Basic metabolic panel     Status: Abnormal   Collection Time: 06/24/19  6:03 AM  Result Value Ref Range   Sodium 133 (L) 135 - 145 mmol/L   Potassium 4.0 3.5 - 5.1 mmol/L   Chloride 99 98 - 111 mmol/L   CO2 22 22 - 32 mmol/L   Glucose, Bld 184 (H) 70 - 99 mg/dL   BUN 25 (H) 8 - 23 mg/dL   Creatinine, Ser 2.441.33 (H) 0.44 - 1.00 mg/dL   Calcium 8.4 (L) 8.9 - 10.3 mg/dL   GFR calc non Af Amer 37 (L) >60 mL/min   GFR calc Af Amer 43 (L) >60 mL/min  Anion gap 12 5 - 15    Comment: Performed at [facility], [address] [place], [place] [address]  CBC     Status: Abnormal   Collection Time: 06/24/19  6:03 AM  Result Value Ref Range   WBC 12.8 (H) 4.0 - 10.5 K/uL   RBC 3.73 (L) 3.87 - 5.11 MIL/uL   Hemoglobin 11.2 (L) 12.0 - 15.0 g/dL   HCT 60.435.5 (L) 54.036.0 - 98.146.0 %   MCV 95.2 80.0 - 100.0 fL   MCH 30.0 26.0 - 34.0 pg   MCHC 31.5 30.0 - 36.0 g/dL   RDW 19.113.5 47.811.5 - 29.515.5 %   Platelets 137 (L) 150 - 400 K/uL   nRBC 0.0 0.0 - 0.2 %    Comment: Performed at [facility], [address] [place], [place] [address]  Hemoglobin A1c     Status: Abnormal   Collection Time: 06/24/19  6:03 AM  Result Value Ref Range   Hgb A1c MFr Bld 7.1 (H) 4.8 - 5.6 %    Comment: (NOTE) Pre diabetes:          5.7%-6.4% Diabetes:              >6.4% Glycemic control for   <7.0% adults with diabetes    Mean Plasma Glucose 157.07 mg/dL    Comment: Performed at [facility], [address] [place], [place] [address]   Dg Chest Port 1 View  Result Date: 06/23/2019 CLINICAL DATA:  Initial evaluation for acute fever, epigastric pain. EXAM:  PORTABLE CHEST 1 VIEW COMPARISON:  Prior radiograph from 07/31/2014. FINDINGS: Median sternotomy wires underlying surgical clips and CABG markers noted. Cardiomegaly, stable. Mediastinal silhouette normal. Aortic atherosclerosis. Lungs mildly hypoinflated. Perihilar vascular congestion without overt pulmonary edema. No pleural effusion. No consolidative airspace opacity. No pneumothorax. Few scattered calcified granulomata noted. No acute osseous abnormality. Soft tissue calcifications overlie the left neck and chest. IMPRESSION: 1. Stable cardiomegaly with mild perihilar vascular congestion without overt pulmonary edema. 2. No other active cardiopulmonary disease. Electronically Signed   By: Rise MuBenjamin  McClintock M.D.   On: 06/23/2019 23:25   [place] Abdomen Limited Ruq  Result Date: 06/24/2019 CLINICAL DATA:  Right upper quadrant pain. EXAM: ULTRASOUND ABDOMEN LIMITED RIGHT UPPER QUADRANT COMPARISON:  None. FINDINGS: Gallbladder: Distended with multiple gallstones. Borderline wall thickening of 3-4 mm. No pericholecystic fluid. No sonographic Murphy sign noted by sonographer. Common bile duct: Diameter: 3-4 mm, normal. Liver: No focal lesion identified. Within normal limits in parenchymal echogenicity. Portal vein is patent on color Doppler imaging with normal direction of blood flow towards the liver. Other: None. IMPRESSION: 1. Gallstones with borderline wall thickening of 3-4 mm. Consider nuclear medicine HIDA scan if there is clinical concern for acute cholecystitis. No sonographic Murphy sign or pericholecystic fluid. 2. No biliary dilatation. Electronically Signed   By: Narda RutherfordMelanie  Sanford M.D.   On: 06/24/2019 01:20      Assessment/Plan DM Pulmonary HTN HTN CAD, s/p CABG CKD Stage III CHF UTI  Abdominal pain, gallstones Given her limited history, it is difficult to tell if she is having biliary colic or just reflux given she eats late at night and has pain after this when laying down.  She  currently has a HIDA scan ordered.  We will await these results.  If this is negative there would be no plans for surgery this admission.  She could certainly follow up as an outpatient if she wanted follow up for her  gallstones.  She could also be started on PPI/H2 to see if this helped as well.  If her scan is positive, then will discuss with MD regarding surgical plan as she currently has no further pain.  We will follow along.  FEN - NPO for HIDA scan VTE - ok for chemical prophylaxis from our standpoint ID - Rocephin/Flagyl  Letha Cape, [facility] Surgery 06/24/2019, 10:03 AM Pager: [phone]

## 2019-06-25 ENCOUNTER — Inpatient Hospital Stay (HOSPITAL_COMMUNITY): Payer: Medicare Other

## 2019-06-25 DIAGNOSIS — Z01810 Encounter for preprocedural cardiovascular examination: Secondary | ICD-10-CM

## 2019-06-25 DIAGNOSIS — I272 Pulmonary hypertension, unspecified: Secondary | ICD-10-CM

## 2019-06-25 DIAGNOSIS — R1011 Right upper quadrant pain: Secondary | ICD-10-CM

## 2019-06-25 DIAGNOSIS — I5032 Chronic diastolic (congestive) heart failure: Secondary | ICD-10-CM

## 2019-06-25 DIAGNOSIS — I350 Nonrheumatic aortic (valve) stenosis: Secondary | ICD-10-CM

## 2019-06-25 DIAGNOSIS — E785 Hyperlipidemia, unspecified: Secondary | ICD-10-CM

## 2019-06-25 DIAGNOSIS — I25708 Atherosclerosis of coronary artery bypass graft(s), unspecified, with other forms of angina pectoris: Secondary | ICD-10-CM

## 2019-06-25 LAB — GASTROINTESTINAL PANEL BY PCR, STOOL (REPLACES STOOL CULTURE)

## 2019-06-25 LAB — COMPREHENSIVE METABOLIC PANEL
ALT: 11 U/L (ref 0–44)
AST: 13 U/L — ABNORMAL LOW (ref 15–41)
Albumin: 2.2 g/dL — ABNORMAL LOW (ref 3.5–5.0)
Alkaline Phosphatase: 45 U/L (ref 38–126)
Anion gap: 12 (ref 5–15)
BUN: 26 mg/dL — ABNORMAL HIGH (ref 8–23)
CO2: 22 mmol/L (ref 22–32)
Calcium: 7.9 mg/dL — ABNORMAL LOW (ref 8.9–10.3)
Chloride: 103 mmol/L (ref 98–111)
Creatinine, Ser: 1.41 mg/dL — ABNORMAL HIGH (ref 0.44–1.00)
GFR calc Af Amer: 40 mL/min — ABNORMAL LOW (ref >60)
GFR calc non Af Amer: 34 mL/min — ABNORMAL LOW (ref >60)
Glucose, Bld: 139 mg/dL — ABNORMAL HIGH (ref 70–99)
Potassium: 3.8 mmol/L (ref 3.5–5.1)
Sodium: 137 mmol/L (ref 135–145)
Total Bilirubin: 0.6 mg/dL (ref 0.3–1.2)
Total Protein: 5.5 g/dL — ABNORMAL LOW (ref 6.5–8.1)

## 2019-06-25 LAB — ECHOCARDIOGRAM COMPLETE
Height: 60 in
Weight: 2172.85 oz

## 2019-06-25 LAB — GLUCOSE, CAPILLARY
Glucose-Capillary: 112 mg/dL — ABNORMAL HIGH (ref 70–99)
Glucose-Capillary: 114 mg/dL — ABNORMAL HIGH (ref 70–99)
Glucose-Capillary: 100 mg/dL — ABNORMAL HIGH (ref 70–99)
Glucose-Capillary: 155 mg/dL — ABNORMAL HIGH (ref 70–99)

## 2019-06-25 LAB — CBC
HCT: 28.3 % — ABNORMAL LOW (ref 36.0–46.0)
Hemoglobin: 9 g/dL — ABNORMAL LOW (ref 12.0–15.0)
MCH: 30.7 pg (ref 26.0–34.0)
MCHC: 31.8 g/dL (ref 30.0–36.0)
MCV: 96.6 fL (ref 80.0–100.0)
Platelets: 109 10*3/uL — ABNORMAL LOW (ref 150–400)
RBC: 2.93 MIL/uL — ABNORMAL LOW (ref 3.87–5.11)
RDW: 13.6 % (ref 11.5–15.5)
WBC: 10.1 10*3/uL (ref 4.0–10.5)
nRBC: 0 % (ref 0.0–0.2)

## 2019-06-25 LAB — C DIFFICILE QUICK SCREEN W PCR REFLEX
C Diff antigen: NEGATIVE
C Diff interpretation: NOT DETECTED
C Diff toxin: NEGATIVE

## 2019-06-25 LAB — SURGICAL PCR SCREEN
MRSA, PCR: NEGATIVE
Staphylococcus aureus: POSITIVE — AB

## 2019-06-25 MED ORDER — MUPIROCIN 2 % EX OINT
1.0000 "application " | TOPICAL_OINTMENT | Freq: Two times a day (BID) | CUTANEOUS | Status: AC
  Administered 2019-06-25 – 2019-06-29 (×10): 1 via NASAL
  Filled 2019-06-25: qty 22

## 2019-06-25 MED ORDER — CHLORHEXIDINE GLUCONATE CLOTH 2 % EX PADS
6.0000 | MEDICATED_PAD | Freq: Every day | CUTANEOUS | Status: AC
  Administered 2019-06-25 – 2019-06-29 (×5): 6 via TOPICAL

## 2019-06-25 NOTE — Progress Notes (Signed)
  Echocardiogram 2D Echocardiogram has been performed.  Margaret [name] 06/25/2019, 4:48 PM

## 2019-06-25 NOTE — Plan of Care (Signed)

## 2019-06-25 NOTE — Progress Notes (Signed)
PROGRESS NOTE    Margaret [name]  ZOX:[id number] DOB: 06/18/1934 DOA: 06/23/2019 PCP: Margaret [name], Margaret P, MD   Brief Narrative:  Margaret [name] is a 85 y.o. female with medical history significant of hyperlipidemia, diabetes mellitus, hypothyroidism, vitamin B12 deficiency, PVD, pulmonary hypertension, CAD, CABG, sCHF with EF of 45%, CKD stage III, who presents with RUQ abdominal pain. Pt speaks gujarati and hindi, and little [place]. The history is obtained with her daughter's help. Per her daughter, patient started having chest pain since yesterday, which is located in the right upper quadrant, constant, sharp, radiating to the back.  Patient does not have nausea vomiting or diarrhea.  Patient had mild fever of 100.  Patient was seen in urgent care, and was sent to ED for further evaluation treatment.  Patient does not have chest pain, cough, shortness of breath.  Patient has mild discomfort on urination. No unilateral weakness. In ED pt was found to have WBC 15.7, positive urinalysis (turbid appearance, large amount of leukocyte, many bacteria, WBC>50), pending COVID-19 test, lactic acid 1.8, stable renal function, sodium 128, currently temperature 99, blood pressure 138/82, heart rate 80 to 90s, oxygen sat 98% on room air.  Chest x-ray showed stable cardiomegaly with mild perihilar vascular congestion.     Assessment & Plan:   Principal Problem:   Abdominal pain Active Problems:   Vitamin B12 deficiency   Chronic diastolic CHF (congestive heart failure), NYHA class 2 (HCC)   Hyperlipidemia with target LDL less than 70   CAD (coronary artery disease) of artery bypass graft   CKD (chronic kidney disease), stage III (HCC)   UTI (urinary tract infection)   Hyponatremia   Type II diabetes mellitus with renal manifestations (HCC)   Sepsis (HCC)   Hypothyroidism    Acute onset intractable abdominal pain, POA, resolved Rule out gastritis, peptic ulcer disease, gastroenteritis:  -HIDA scan distant with  acute cholecystitis. -US-RUQ showed gallstones with borderline wall thickening of 3-4 mm. No sonographic Murphy sign or pericholecystic fluid. No biliary dilatation. Lipase normal 25.  -Surgery following, appreciate insight recommendations -Cardiology consulted from a surgical standpoint for preoperative clearance given cardiac history as below - repeat echo pending -Lengthy discussion at bedside today with patient and daughter, continue to be somewhat hesitant and anxious about proceeding with surgery given patient's advanced age, however we discussed that currently the patient appears quite well, stable, without acute issue and if we delay surgery that may not be the case in the near future.  Defer to surgery for further perioperative planning, clear liquid diet today, n.p.o. at midnight in case family does agree to surgery. -Currently on Rocephin and Flagyl for coverage of intra-abdominal infection as well as UTI -PRN Zofran for nausea, morphine for pain -Follow cultures  Sepsis secondary to UTI (urinary tract infection)  POA:  -Patient meets criteria for sepsis with leukocytosis and tachypnea (RR 22).  -Abx: rocephin and flagyl as above -Cultures, currently preliminary negative -Continue IV fluids, sepsis criteria resolved given no longer tachypneic, tachycardic, or with leukocytosis  Chronic diastolic CHF (congestive heart failure), NYHA class 2 (HCC), not in acute exacerbation:  - Echo on 03/15/2010 showed EF of 45-50% - repeat pending- -Cardiology following as above -Appears euvolemic  Hyperlipidemia with target LDL less than 70: -Continue home crestor  CAD (coronary artery disease) of artery bypass graft: -continue Crestor, Coreg -Hold Plavix in case patient needs surgery -Questionable stenting -at bedside this morning patient stated that she had recalled cardiology placing something in her heart years  ago confirmed the word "stent" was used.  CKD (chronic kidney disease),  stage III [facility](HCC):  Patient had a creatinine 2.09 on 09/02/2014.   Her creatinine is 1.4 and stable Follow labs  Type II diabetes mellitus with renal manifestations, moderately well controlled: Last A1c 6.1 on 07/31/14, well controled then.  Home meds include-Tradjenta, glipizide-metformin -SSI - A1c 7.1 currently  Hyponatremia, likely hypovolemic in nature given diuretics:  -Currently holding Lasix - follow morning labs, -TSH within normal limits  Hypothyroidism: -Continue Synthroid  DVT prophylaxis: SCDs, perioperatively Code Status: Full Family Communication: Lengthy discussion with patient's daughter over the phone this morning as above.  Family continues to be somewhat hesitant to proceed with surgery given perceived risk given her age and medical history.  Discussed that patient's medical risk will only go up given as she advances in age and could have worsening comorbid conditions in the near future -family to discuss and get back with the medical/surgical teams once they have made a decision Disposition Plan: Patient currently on clears, n.p.o. at midnight for tentative procedure in the morning likely laparoscopic cholecystectomy pending cardiac clearance and family agreement for procedure.  Pending surgery likely disposition back home pending postsurgical needs and clinical improvement.  Consultants:   General surgery, cardiology  Procedures:   Tentative laparoscopic cholecystectomy pending further evaluation and work-up as above; as well as family agreement to proceed with procedure  Antimicrobials:  Rocephin, Flagyl  Subjective: No acute issues or events overnight, via daughter patient declines any chest pain, shortness of breath, nausea, vomiting, diarrhea, constipation, headache, fever, chills.  Objective: Vitals:   06/25/19 0406 06/25/19 0408 06/25/19 0418 06/25/19 0800  BP: (!) 98/53  (!) 95/47 (!) 97/49  Pulse: 92  94 86  Resp: 20  18 17   Temp: 97.6 F (36.4  C)  98.4 F (36.9 C) 98.3 F (36.8 C)  TempSrc: Oral  Oral Oral  SpO2: 96%  96% 98%  Weight:  61.6 kg    Height:        Intake/Output Summary (Last 24 hours) at 06/25/2019 0813 Last data filed at 06/25/2019 0317 Gross per 24 hour  Intake 1770.23 ml  Output -  Net 1770.23 ml   Filed Weights   06/23/19 1720 06/24/19 0652 06/25/19 0408  Weight: 63 kg 57.6 kg 61.6 kg    Examination:  General:  Pleasantly resting in bed, No acute distress. HEENT:  Normocephalic atraumatic.  Sclerae nonicteric, noninjected.  Extraocular movements intact bilaterally. Neck:  Without mass or deformity.  Trachea is midline. Lungs:  Clear to auscultate bilaterally without rhonchi, wheeze, or rales. Heart:  Regular rate and rhythm.  Without murmurs, rubs, or gallops. Abdomen:  Soft, nontender, nondistended.  Without guarding or rebound.  Negative Murphy's, negative epigastric pain with deep palpation Extremities: Without cyanosis, clubbing, edema, or obvious deformity. Vascular:  Dorsalis pedis and posterior tibial pulses palpable bilaterally. Skin:  Warm and dry, no erythema, no ulcerations.  Data Reviewed: I have personally reviewed following labs and imaging studies  CBC: Recent Labs  Lab 06/23/19 1728 06/24/19 0603 06/25/19 0248  WBC 15.7* 12.8* 10.1  HGB 11.9* 11.2* 9.0*  HCT 37.3 35.5* 28.3*  MCV 94.9 95.2 96.6  PLT 162 137* 109*   Basic Metabolic Panel: Recent Labs  Lab 06/23/19 1728 06/24/19 0603 06/25/19 0248  NA 128* 133* 137  K 4.0 4.0 3.8  CL 92* 99 103  CO2 24 22 22   GLUCOSE 302* 184* 139*  BUN 22 25* 26*  CREATININE 1.36*  1.33* 1.41*  CALCIUM 8.9 8.4* 7.9*   GFR: Estimated Creatinine Clearance: 24.8 mL/min (A) (by C-G formula based on SCr of 1.41 mg/dL (H)). Liver Function Tests: Recent Labs  Lab 06/23/19 1728 06/25/19 0248  AST 19 13*  ALT 16 11  ALKPHOS 59 45  BILITOT 1.1 0.6  PROT 7.3 5.5*  ALBUMIN 3.2* 2.2*   Recent Labs  Lab 06/23/19 1728  LIPASE 25    No results for input(s): AMMONIA in the last 168 hours. Coagulation Profile: No results for input(s): INR, PROTIME in the last 168 hours. Cardiac Enzymes: No results for input(s): CKTOTAL, CKMB, CKMBINDEX, TROPONINI in the last 168 hours. BNP (last 3 results) No results for input(s): PROBNP in the last 8760 hours. HbA1C: Recent Labs    06/24/19 0603  HGBA1C 7.1*   CBG: Recent Labs  Lab 06/24/19 0857 06/24/19 1631 06/24/19 2103 06/25/19 0804  GLUCAP 172* 118* 207* 112*   Lipid Profile: No results for input(s): CHOL, HDL, LDLCALC, TRIG, CHOLHDL, LDLDIRECT in the last 72 hours. Thyroid Function Tests: Recent Labs    06/24/19 0603  TSH 1.435   Anemia Panel: No results for input(s): VITAMINB12, FOLATE, FERRITIN, TIBC, IRON, RETICCTPCT in the last 72 hours. Sepsis Labs: Recent Labs  Lab 06/23/19 2345  LATICACIDVEN 1.8    Recent Results (from the past 240 hour(s))  SARS Coronavirus 2 [facility] order, Performed in [facility] hospital lab) Nasopharyngeal Nasopharyngeal Swab     Status: None   Collection Time: 06/24/19  3:05 AM   Specimen: Nasopharyngeal Swab  Result Value Ref Range Status   SARS Coronavirus 2 NEGATIVE NEGATIVE Final    Comment: (NOTE) If result is NEGATIVE SARS-CoV-2 target nucleic acids are NOT DETECTED. The SARS-CoV-2 RNA is generally detectable in upper and lower  respiratory specimens during the acute phase of infection. The lowest  concentration of SARS-CoV-2 viral copies this assay can detect is 250  copies / mL. A negative result does not preclude SARS-CoV-2 infection  and should not be used as the sole basis for treatment or other  patient management decisions.  A negative result may occur with  improper specimen collection / handling, submission of specimen other  than nasopharyngeal swab, presence of viral mutation(s) within the  areas targeted by this assay, and inadequate number of viral copies  (<250 copies / mL). A negative result  must be combined with clinical  observations, patient history, and epidemiological information. If result is POSITIVE SARS-CoV-2 target nucleic acids are DETECTED. The SARS-CoV-2 RNA is generally detectable in upper and lower  respiratory specimens dur ing the acute phase of infection.  Positive  results are indicative of active infection with SARS-CoV-2.  Clinical  correlation with patient history and other diagnostic information is  necessary to determine patient infection status.  Positive results do  not rule out bacterial infection or co-infection with other viruses. If result is PRESUMPTIVE POSTIVE SARS-CoV-2 nucleic acids MAY BE PRESENT.   A presumptive positive result was obtained on the submitted specimen  and confirmed on repeat testing.  While 2019 novel coronavirus  (SARS-CoV-2) nucleic acids may be present in the submitted sample  additional confirmatory testing may be necessary for epidemiological  and / or clinical management purposes  to differentiate between  SARS-CoV-2 and other Sarbecovirus currently known to infect humans.  If clinically indicated additional testing with an alternate test  methodology [phone]) is advised. The SARS-CoV-2 RNA is generally  detectable in upper and lower respiratory sp ecimens during the acute  phase of infection. The expected result is Negative. Fact Sheet for Patients:  BoilerBrush.com.cyhttps://www.fda.gov/media/136312/download Fact Sheet for Healthcare Providers: https://pope.com/https://www.fda.gov/media/136313/download This test is not yet approved or cleared by the [place] FDA and has been authorized for detection and/or diagnosis of SARS-CoV-2 by FDA under an Emergency Use Authorization (EUA).  This EUA will remain in effect (meaning this test can be used) for the duration of the COVID-19 declaration under Section 564(b)(1) of the Act, 21 U.S.C. section 360bbb-3(b)(1), unless the authorization is terminated or revoked sooner. Performed at [facility], [address] [place], [place] [address]   C Difficile Quick Screen w PCR reflex     Status: None   Collection Time: 06/24/19 11:52 PM   Specimen: STOOL  Result Value Ref Range Status   C Diff antigen NEGATIVE NEGATIVE Final   C Diff toxin NEGATIVE NEGATIVE Final   C Diff interpretation No C. difficile detected.  Final    Comment: Performed at [facility], [address] [place], [place] [address]  Surgical pcr screen     Status: Abnormal   Collection Time: 06/25/19  4:16 AM   Specimen: Nasal Mucosa; Nasal Swab  Result Value Ref Range Status   MRSA, PCR NEGATIVE NEGATIVE Final   Staphylococcus aureus POSITIVE (A) NEGATIVE Final    Comment: (NOTE) The Xpert SA Assay (FDA approved for NASAL specimens in patients 83 years of age and older), is one component of a comprehensive surveillance program. It is not intended to diagnose infection nor to guide or monitor treatment. Performed at [facility], [address] [place], [place] [address]          Radiology Studies: Nm Hepatobiliary Liver Func  Result Date: 06/24/2019 CLINICAL DATA:  Cholelithiasis EXAM: NUCLEAR MEDICINE HEPATOBILIARY IMAGING TECHNIQUE: Sequential images of the abdomen were obtained out to 60 minutes following intravenous administration of radiopharmaceutical. RADIOPHARMACEUTICALS:  5.07 mCi Tc-7166m  Choletec IV COMPARISON:  None FINDINGS: Normal tracer clearance from bloodstream indicating normal hepatocellular function. Prompt excretion of tracer into the biliary tree. Small bowel visualized at 15 minutes. At 55 minutes, gallbladder had not visualized. Patient's blood pressure was borderline low, precluding IV morphine administration. Delayed imaging was performed at 1611 hours. No definite delayed visualization of the gallbladder is seen. A tiny focus of tracer to the RIGHT lateral aspect of the CBD is felt to represent the duodenal bulb. IMPRESSION: Patent CBD. Nonvisualization of the  gallbladder despite delayed imaging at nearly 4 hours, consistent with acute cholecystitis. Electronically Signed   By: Ulyses SouthwardMark  Boles M.D.   On: 06/24/2019 17:16   Dg Chest Port 1 View  Result Date: 06/23/2019 CLINICAL DATA:  Initial evaluation for acute fever, epigastric pain. EXAM: PORTABLE CHEST 1 VIEW COMPARISON:  Prior radiograph from 07/31/2014. FINDINGS: Median sternotomy wires underlying surgical clips and CABG markers noted. Cardiomegaly, stable. Mediastinal silhouette normal. Aortic atherosclerosis. Lungs mildly hypoinflated. Perihilar vascular congestion without overt pulmonary edema. No pleural effusion. No consolidative airspace opacity. No pneumothorax. Few scattered calcified granulomata noted. No acute osseous abnormality. Soft tissue calcifications overlie the left neck and chest. IMPRESSION: 1. Stable cardiomegaly with mild perihilar vascular congestion without overt pulmonary edema. 2. No other active cardiopulmonary disease. Electronically Signed   By: Rise MuBenjamin  McClintock M.D.   On: 06/23/2019 23:25   [place] Abdomen Limited Ruq  Result Date: 06/24/2019 CLINICAL DATA:  Right upper quadrant pain. EXAM: ULTRASOUND ABDOMEN LIMITED RIGHT UPPER QUADRANT COMPARISON:  None. FINDINGS: Gallbladder: Distended with multiple gallstones.  Borderline wall thickening of 3-4 mm. No pericholecystic fluid. No sonographic Murphy sign noted by sonographer. Common bile duct: Diameter: 3-4 mm, normal. Liver: No focal lesion identified. Within normal limits in parenchymal echogenicity. Portal vein is patent on color Doppler imaging with normal direction of blood flow towards the liver. Other: None. IMPRESSION: 1. Gallstones with borderline wall thickening of 3-4 mm. Consider nuclear medicine HIDA scan if there is clinical concern for acute cholecystitis. No sonographic Murphy sign or pericholecystic fluid. 2. No biliary dilatation. Electronically Signed   By: Keith Rake M.D.   On: 06/24/2019 01:20         Scheduled Meds: . carvedilol  12.5 mg Oral BID WC  . Chlorhexidine Gluconate Cloth  6 each Topical Daily  . insulin aspart  0-5 Units Subcutaneous QHS  . insulin aspart  0-9 Units Subcutaneous TID WC  . levothyroxine  125 mcg Oral QAC breakfast  . mupirocin ointment  1 application Nasal BID  . rosuvastatin  10 mg Oral QPC supper  . sodium chloride flush  3 mL Intravenous Once  . vitamin B-12  5,000 mcg Oral Daily   Continuous Infusions: . sodium chloride 75 mL/hr at 06/25/19 0404  . cefTRIAXone (ROCEPHIN)  IV 2 g (06/24/19 2134)  . metronidazole 500 mg (06/25/19 0210)     LOS: 1 day   Time spent: 87min  William C Lancaster, DO [facility]  If 7PM-7AM, please contact night-coverage [email or web address] Password [email or web address] 06/25/2019, 8:13 AM

## 2019-06-25 NOTE — Consult Note (Addendum)
Cardiology Consultation:   Patient ID: Margaret AversSudha Shillingburg MRN: [id number]; DOB: 09/01/1934  Admit date: 06/23/2019 Date of Consult: 06/25/2019  Primary Care Provider: Ileana LaddWong, Francis P, MD Primary Cardiologist: No primary care provider on file.  Primary Electrophysiologist:  None    Patient Profile:   Margaret [name] is a 85 y.o. female with a hx of pulmonary HTN, HTN, HLD, DM2, PVD, CAD (prior LAD stent occlusion, with progression of CAD to LM disease--CABG), CKD stage III, and Chronic diastolic heart failure, and hypothyroidism who is being seen today for the evaluation of pre-op clearance for possible cholecystectomy at the request of Dr. Natale MilchLancaster.  History of Present Illness:  Patient has a known history of CAD (prior LAD stent occlusion, with progression of CAD to LM disease) s/p CABG in 2011. In the past patient had seen Dr. Eldridge DaceVaranasi. In 02/2014 patient reported she was admitted to the hospital in [place] for CHF and renal failure. Patient was referred to Dr. Kirke CorinArida 05/2014 for PAD. She had moderately reduced ABIs on the left. Echo showed normal LV function with inferior wall hypokinesis. A pharmacologic nuclear stress test was ordered. She had a Lexiscan injection, developed diarrhea, acute CHF requiring BiPAP and was hypoxic. Patient was admitted 07/17/2014 - 07/24/2014. On admission patient underwent cath which showed severe underlying 3V CAD with patent SVG to OM2 and on 3. Moderate mid RCA stenosis which was not grafted; patent LIMA to LAD, and SVG to diagonal; mildly reduced LV systolic function with evidence of severe MR; severely elevated filling pressures with giant V waves noted on pressure racing. MR was likely ischemic. TEE was ordered to further evaluate the mitral valve which showed moderate TR and at least moderate pulmonary hypertension with no ruptured chordae; her EF was 50-55%. She was started on an ACE inhibitor.   Patient was admitted 07/31/2014- 08/02/2014 for heart failure and acute renal  failure. Patient followed up with Dr. Kirke CorinArida and was stable after discharge. As for PAD symptoms were not lifestyle limiting and medical management was pursued. Dr. Kirke CorinArida had planned to follow up with US renal arteries for possible renal artery stenosis due to renal failure after being started on ACE, but was not followed up. Patient was back and forth between [place] for the last 3-4 years. Daughter reports she does not think patient had any significant medical changes during this time. [place] has been in the [place] the beginning of COVID.   Ms. Ace GinsVora presented to the ER yesterday for RUQ abdominal pain. Pt speaks gujarati and a little [place]. The history was obtained with the daughters help. According to the daughter patient started having abdominal pain in the RUQ and radiating to the back Pain was sharp and constant. Denied N/V. Patient was febrile to 100. She was seen in an urgent care and sent to the ED. Denied chest pain, cough, sob. In the ED BP 138/82, HR 80-90s, 98% oxygen. She was found to have WBC 15.7. Urine was positive for infection. Lactic acid 1.8. Creatinine stable. CXR showed stable cardiomegaly and perihilar vascular congestion. Patient was admitted for further work-up. Patient was placed on IV abx for sepsis 2/2 to UTI. Cultures were so far negative.    Liver function was found to be normal. US RUQ showed gallstones with borderline wall thickening of 3-4 mm. Negative murphy sign. No biliary dilation. Lipase 25. Surgery was consulted. HIDA scan was positive and surgery was recommended. Due to patients multiple cardiac problems cardiology was consulted for pre-op evaluation.   Patient  denies tobacco/drug/alcohol use. She has positive family history for heart attack in heart disease in her sister. Patient has baseline sob on exertion and has used oxygen in the past when she lived in [place]. She reports her last admission for acute heart failure/pulmonary edema was 8 months ago in [place]. She  denies chest pain or palpitations. She denies recent leg edema. Patient is overall able to care for herself. She uses a cane to walk in the house and a walker outside. She can walk 1-2 blocks without angina.   Heart Pathway Score:     Past Medical History:  Diagnosis Date   Acute on chronic renal failure (HCC) 07/31/2014   Acute pulmonary edema with congestive heart failure (HCC) 07/17/2014   Acute renal insufficiency 07/19/2014   Anemia    ARF (acute renal failure) (HCC) 07/31/2014   Atherosclerotic peripheral vascular disease with intermittent claudication (HCC) 04/2014   LEA Dopplers: RABI 1.02/ LABI: 0.61 (L SFA, Pop & DP blunted wave forms) -- correlates with L leg claudication   Atrophic vaginitis 04/2013   CAD (coronary artery disease)    a. prior PCI LAD 2005. b. CABG 2011.   Chronic diastolic CHF (congestive heart failure) (HCC)    CKD (chronic kidney disease), stage III (HCC)    Hx of CABG    Hx of echocardiogram 05/2014   EF 50-55%, inf-lat HK, Gr 2 DD, very mild AS (mean 10 mmHg), mild MR, PASP 36 mmHg; calcified density in LA (suggest TEE)   Hyperlipidemia with target LDL less than 70    Mitral regurgitation    PAD (peripheral artery disease) (HCC)    Pulmonary hypertension (HCC)    RESPIRATORY ARREST assoc w MYOVIEW    Thyroid disease    Tricuspid regurgitation    Type 2 diabetes mellitus (HCC)    Vitamin B12 deficiency     Past Surgical History:  Procedure Laterality Date   CARDIAC CATHETERIZATION  2005   LAD stent occluded   CARDIAC CATHETERIZATION  03/2010    Ostial left main 60, OM1 70, circumflex 50-60, mid LAD stent occluded, mid RCA 30-40, RV marginal 90, EF 45-50%. >>> CABG   CORONARY ARTERY BYPASS GRAFT  03/2010   LIMA-LAD, SVG-D1, seqSVG-OM1-OM3   LEFT HEART CATHETERIZATION WITH CORONARY/GRAFT ANGIOGRAM N/A 07/22/2014   Procedure: LEFT HEART CATHETERIZATION WITH Isabel Caprice;  Surgeon: Iran Ouch, MD;  Location: [facility] CATH  [facility];  Service: Cardiovascular;  Laterality: N/A;   TOTAL KNEE ARTHROPLASTY       Home Medications:  Prior to Admission medications   Medication Sig Start Date End Date Taking? Authorizing Provider  carvedilol (COREG) 12.5 MG tablet Take 1 tablet (12.5 mg total) by mouth 2 (two) times daily with a meal. 08/11/14   Iran Ouch, MD  clopidogrel (PLAVIX) 75 MG tablet Take 75 mg by mouth daily.    [provider]  Cyanocobalamin (VITAMIN B-12) 2500 MCG TABS Take 5,000 mcg by mouth daily. 08/02/14   Joseph Art, DO  furosemide (LASIX) 40 MG tablet Take 40 mg by mouth every morning. 08/02/14   Joseph Art, DO  gabapentin (NEURONTIN) 100 MG capsule 100 mg, 3 times a day as needed for nerve pain 09/11/14   Reather Littler, MD  glipiZIDE-metformin (METAGLIP) 5-500 MG per tablet Take 1 tablet by mouth 2 (two) times daily before a meal.    [provider]  levothyroxine (SYNTHROID, LEVOTHROID) 125 MCG tablet Take 1 tablet (125 mcg total) by mouth daily before  breakfast. 07/24/14   Barrett, Evelene Croon, PA-C  linagliptin (TRADJENTA) 5 MG TABS tablet Take 5 mg by mouth every morning.    [provider]  rosuvastatin (CRESTOR) 10 MG tablet Take 1 tablet (10 mg total) by mouth daily after supper. [place]avel from [place] 07/24/14   Barrett, Evelene Croon, PA-C    Inpatient Medications: Scheduled Meds:  carvedilol  12.5 mg Oral BID WC   Chlorhexidine Gluconate Cloth  6 each Topical Daily   insulin aspart  0-5 Units Subcutaneous QHS   insulin aspart  0-9 Units Subcutaneous TID WC   levothyroxine  125 mcg Oral QAC breakfast   mupirocin ointment  1 application Nasal BID   rosuvastatin  10 mg Oral QPC supper   sodium chloride flush  3 mL Intravenous Once   vitamin B-12  5,000 mcg Oral Daily   Continuous Infusions:  sodium chloride 75 mL/hr at 06/25/19 0404   cefTRIAXone (ROCEPHIN)  IV 2 g (06/24/19 2134)   metronidazole 500 mg (06/25/19 0210)   PRN Meds: acetaminophen  **OR** acetaminophen, gabapentin, morphine injection, ondansetron (ZOFRAN) IV  Allergies:    Allergies  Allergen Reactions   Myoview [Technetium-27m] Shortness Of Breath   Penicillins Other (See Comments)    Unknown allergic reaction     Social History:   Social History   Socioeconomic History   Marital status: Single    Spouse name: Not on file   Number of children: Not on file   Years of education: Not on file   Highest education level: Not on file  Occupational History   Occupation: Retired  Scientist, product/process development strain: Not on file   Food insecurity    Worry: Not on file    Inability: Not on Lexicographer needs    Medical: Not on file    Non-medical: Not on file  Tobacco Use   Smoking status: Never Smoker   Smokeless tobacco: Never Used  Substance and Sexual Activity   Alcohol use: No   Drug use: No   Sexual activity: Not on file  Lifestyle   Physical activity    Days per week: Not on file    Minutes per session: Not on file   Stress: Not on file  Relationships   Social connections    Talks on phone: Not on file    Gets together: Not on file    Attends religious service: Not on file    Active member of club or organization: Not on file    Attends meetings of clubs or organizations: Not on file    Relationship status: Not on file   Intimate partner violence    Fear of current or ex partner: Not on file    Emotionally abused: Not on file    Physically abused: Not on file    Forced sexual activity: Not on file  Other Topics Concern   Not on file  Social History Narrative   Son is POA.   Does not smoke - never smoked.   Spends 1/2 year in [place] & 1/2 year in [place].    Family History:   Family History  Problem Relation Age of Onset   Diabetes Father    Ulcers Other    Diabetes Other    Arthritis Other    Diabetes Other    Heart attack Neg Hx      ROS:  Please see the history of present illness. No  claudication.  All other ROS reviewed  and negative.     Physical Exam/Data:   Vitals:   06/25/19 0406 06/25/19 0408 06/25/19 0418 06/25/19 0800  BP: (!) 98/53  (!) 95/47 (!) 97/49  Pulse: 92  94 86  Resp: 20  18 17   Temp: 97.6 F (36.4 C)  98.4 F (36.9 C) 98.3 F (36.8 C)  TempSrc: Oral  Oral Oral  SpO2: 96%  96% 98%  Weight:  61.6 kg    Height:        Intake/Output Summary (Last 24 hours) at 06/25/2019 1043 Last data filed at 06/25/2019 0317 Gross per 24 hour  Intake 1770.23 ml  Output --  Net 1770.23 ml   Last 3 Weights 06/25/2019 06/24/2019 06/23/2019  Weight (lbs) 135 lb 12.9 oz 126 lb 15.8 oz 139 lb  Weight (kg) 61.6 kg 57.6 kg 63.05 kg     Body mass index is 26.52 kg/m.  General:  Well nourished, well developed, in no acute distress HEENT: normal Lymph: no adenopathy Neck: no JVD Endocrine:  No thryomegaly Vascular: No carotid bruits; difficult to palpate pedal pulses, no lower extremity ulceration Cardiac:  normal S1, S2; RRR; systolic murmur  Lungs:  clear to auscultation bilaterally, no wheezing, rhonchi or rales  Abd: soft, nontender, no hepatomegaly  Ext: no edema Musculoskeletal:  No deformities, BUE and BLE strength normal and equal Skin: warm and dry  Neuro:  CNs 2-12 intact, no focal abnormalities noted Psych:  Normal affect   EKG:  The EKG was personally reviewed and demonstrates:  NSR, 90 bpm, LAD, poor R wave progression, diffuse nonspecific T wave changes; biphasic p waves inferior leads. Similar to previous EKGs Telemetry:  Telemetry was personally reviewed and demonstrates:  Was d/c'd  Relevant CV Studies:  Cardiac Cath: 07/22/2014 Hemodynamics  RA 21 mmHg  RV 66/12 mmHg  PA 64/31 mmHg  PCWP 32 mmHg. giant V wave noted  LV 144/23 mmHg . LVEDP:  AO 130/62 mmHg  Oxygen saturations:  PA 61%  AO 92%  Cardiac Output (Fick) 6.12  Cardiac Index (Fick) 3.8  Aortic Valve: Peak to Peak gradient: 10 mmHg  Pulmonary vascular resistance (PVR):  2.2 Woods units.  Coronary angiography:  Coronary dominance: Right   Left Main: Heavily calcified with 50% ostial stenosis.   Left Anterior Descending (LAD): Occluded proximally. A stent is noted.   Circumflex (LCx): Medium in size and nondominant. The vessel is subtotally occluded in the midsegment supplying a relatively small OM 2 and on 3. OM1 is normal in size with minor irregularities.   Right Coronary Artery: Normal in size and dominant. The vessel is mildly calcified with 60% mid stenosis. The rest of the vessel has minor irregularities.   Posterior descending artery: Normal in size with no significant disease.   Posterior AV segment: Normal in size with minor irregularities.   Posterolateral branchs: Minor irregularities.  SVG to OM: Occluded at the ostium  SVG to diagonal: Patent with 20% ostial stenosis.  LIMA to LAD: Patent with no significant disease.  Left ventriculography: Left ventricular systolic function is mildly reduced , LVEF is estimated at 40-45% %, there is severe mitral regurgitation With dilated left atrium.  Final Conclusions:  1. Severe underlying three-vessel coronary artery disease with patent SVG to OM 2 and on 3 . Moderate mid RCA stenosis which was not grafted. Patent LIMA to LAD, and SVG to diagonal.  2. mildly reduced LV systolic function with evidence of severe mitral regurgitation.  3. Severely elevated filling pressures with giant  V waves noted on wedge pressure tracing.  Recommendations:  The patient likely has severe ischemic mitral regurgitation after occlusion of the SVG graft to OM. Filling pressures are severely elevated. She became more dyspneic at the end of the case and was given one dose of IV Lasix 40 mg. I will continue with 20 mg IV twice daily. I initiated small dose lisinopril. I recommend a transesophageal echocardiogram to evaluate the mitral valve.   Echo 2015 - Left ventricle: Pseudonormal pattern of diastolic dysfunction  with  elevated filling pressures. The cavity size was normal. Wall  thickness was normal. Systolic function was normal. The estimated  ejection fraction was in the range of 50% to 55%. No obvious  regional wall motion abnormalities.  - Aortic valve: No evidence of vegetation. There was no  regurgitation.  - Aorta: Moderate non-mobile plaque seen in the descending thoracic  aorta.  - Mitral valve: There was mild to moderate regurgitation.  - Left atrium: No evidence of thrombus in the atrial cavity or  appendage.  - Right ventricle: Systolic function was normal.  - Right atrium: No evidence of thrombus in the atrial cavity or  appendage.  - Tricuspid valve: There was moderate regurgitation.  - Pulmonary arteries: Systolic pressure was moderately increased.  PA peak pressure: 52 mm Hg (S).   TTE: 06/25/2019   1. The left ventricle has normal systolic function, with an ejection fraction of 55-60%. The cavity size was normal. Left ventricular diastolic Doppler parameters are consistent with pseudonormalization. Elevated left atrial and left ventricular  end-diastolic pressures.  2. The right ventricle has normal systolic function. The cavity was normal. There is no increase in right ventricular wall thickness. Right ventricular systolic pressure is moderately elevated with an estimated pressure of 55.6 mmHg.  3. Left atrial size was mildly dilated.  4. Right atrial size was mildly dilated.  5. The mitral valve is degenerative.  6. Tricuspid valve regurgitation is moderate.  7. The aortic valve is tricuspid. Moderate thickening of the aortic valve. Moderate calcification of the aortic valve. Mild-moderate stenosis of the aortic valve.  8. The aorta is normal unless otherwise noted.  Laboratory Data:  High Sensitivity Troponin:  No results for input(s): TROPONINIHS in the last 720 hours.   Chemistry Recent Labs  Lab 06/23/19 1728 06/24/19 0603 06/25/19 0248  NA 128* 133* 137   K 4.0 4.0 3.8  CL 92* 99 103  CO2 GLUCOSE 302* 184* 139*  BUN 22 25* 26*  CREATININE 1.36* 1.33* 1.41*  CALCIUM 8.9 8.4* 7.9*  GFRNONAA 36* 37* 34*  GFRAA 42* 43* 40*  ANIONGAP Recent Labs  Lab 06/23/19 1728 06/25/19 0248  PROT 7.3 5.5*  ALBUMIN 3.2* 2.2*  AST 19 13*  ALT 16 11  ALKPHOS 59 45  BILITOT 1.1 0.6   Hematology Recent Labs  Lab 06/23/19 1728 06/24/19 0603 06/25/19 0248  WBC 15.7* 12.8* 10.1  RBC 3.93 3.73* 2.93*  HGB 11.9* 11.2* 9.0*  HCT 37.3 35.5* 28.3*  MCV 94.9 95.2 96.6  MCH 30.3 30.0 30.7  MCHC 31.9 31.5 31.8  RDW 13.4 13.5 13.6  PLT 162 137* 109*   BNP Recent Labs  Lab 06/24/19 0603  BNP 414.2*    DDimer No results for input(s): DDIMER in the last 168 hours.   Radiology/Studies:  Nm Hepatobiliary Liver Func  Result Date: 06/24/2019 CLINICAL DATA:  Cholelithiasis EXAM: NUCLEAR MEDICINE HEPATOBILIARY IMAGING TECHNIQUE: Sequential images of  the abdomen were obtained out to 60 minutes following intravenous administration of radiopharmaceutical. RADIOPHARMACEUTICALS:  5.07 mCi Tc-77m  Choletec IV COMPARISON:  None FINDINGS: Normal tracer clearance from bloodstream indicating normal hepatocellular function. Prompt excretion of tracer into the biliary tree. Small bowel visualized at 15 minutes. At 55 minutes, gallbladder had not visualized. Patient's blood pressure was borderline low, precluding IV morphine administration. Delayed imaging was performed at 1611 hours. No definite delayed visualization of the gallbladder is seen. A tiny focus of tracer to the RIGHT lateral aspect of the CBD is felt to represent the duodenal bulb. IMPRESSION: Patent CBD. Nonvisualization of the gallbladder despite delayed imaging at nearly 4 hours, consistent with acute cholecystitis. Electronically Signed   By: Ulyses Southward M.D.   On: 06/24/2019 17:16   Dg Chest Port 1 View  Result Date: 06/23/2019 CLINICAL DATA:  Initial evaluation for acute fever,  epigastric pain. EXAM: PORTABLE CHEST 1 VIEW COMPARISON:  Prior radiograph from 07/31/2014. FINDINGS: Median sternotomy wires underlying surgical clips and CABG markers noted. Cardiomegaly, stable. Mediastinal silhouette normal. Aortic atherosclerosis. Lungs mildly hypoinflated. Perihilar vascular congestion without overt pulmonary edema. No pleural effusion. No consolidative airspace opacity. No pneumothorax. Few scattered calcified granulomata noted. No acute osseous abnormality. Soft tissue calcifications overlie the left neck and chest. IMPRESSION: 1. Stable cardiomegaly with mild perihilar vascular congestion without overt pulmonary edema. 2. No other active cardiopulmonary disease. Electronically Signed   By: Rise Mu M.D.   On: 06/23/2019 23:25   US Abdomen Limited Ruq  Result Date: 06/24/2019 CLINICAL DATA:  Right upper quadrant pain. EXAM: ULTRASOUND ABDOMEN LIMITED RIGHT UPPER QUADRANT COMPARISON:  None. FINDINGS: Gallbladder: Distended with multiple gallstones. Borderline wall thickening of 3-4 mm. No pericholecystic fluid. No sonographic Murphy sign noted by sonographer. Common bile duct: Diameter: 3-4 mm, normal. Liver: No focal lesion identified. Within normal limits in parenchymal echogenicity. Portal vein is patent on color Doppler imaging with normal direction of blood flow towards the liver. Other: None. IMPRESSION: 1. Gallstones with borderline wall thickening of 3-4 mm. Consider nuclear medicine HIDA scan if there is clinical concern for acute cholecystitis. No sonographic Murphy sign or pericholecystic fluid. 2. No biliary dilatation. Electronically Signed   By: Narda Rutherford M.D.   On: 06/24/2019 01:20    Assessment and Plan:   Pre-op Clearance Cardiology was consulted for pre-op clearance for possible cholecystectomy. Patient had a positive HIDA scan yesterday and surgery is being recommended. Patient has a history of pulmonary HTN, HTN, HLD, CAD s/p CABG, CKD stage  III, and CHF.  - Patient has not been seen by cardiology since 2015. At that time she was hospitalized for acute respiratory failure and acute heart failure. Patient had previous cath, prior LAD stent occlusion, CABG in 2011. Repeat cath showed severe underlying disease and severe MR thought to be ischemic. TEE showed EF 50-55% mild to moderate MR and PA peak pressure 52 mm HG. Patient was lost to follows as she was traveling back and forth from [place] to the [place], but reports further hospitalizations for pulmonary edema and heart failure. Patient had been taking  Coreg 12.5 BID, Crestor 10 mg daily, Plavix 75 mg daily,  lasix 40 mg daily. - Patient denies any ischemic symptoms. She denies chest pain at rest or with exertion.  - She reports her last hospital admission was 8 months ago in [place] for acute heart failure/pulmonary edema. She takes lasix daily and has not had recent swelling or worsened sob.  - Activity level  is mild to moderate. Patient has sob on exertion at baseline and is only able to walk 1-2 blocks before becoming winded. Patient uses a cane around the house and a walker when she goes out. She does not have O2 at home but reports she had used it in [place] in the past. - METS 4.7  - I will order an echo to reassess heart function as well as MR.  - Given prior h/o of worsening symptoms/hypoxia during a myoview stress test I would not repeat it.   - Calculated Risk 10.1 % 30-day risk of death, MI, or cardiac arrest according to the Revised Cardiac Risk Index for Pre-Operative Risk  CAD s/p CABG 2011 - Patient had prior LAD stent occlusion, with progression of CAD to LM disease>>CABG in 2011 - Cath in 2015 showed severe underlying 3V CAD with patent SVG to OM2 and on 3; moderate mid RCA stenosis which was not grafted; patent LIMA to LAD, and SVG to diagonal; mildly reduced LV systolic function with evidence of severe MR; severely elevated filling pressures with giant V waves noted on pressure  racing. MR was thought to be ischemic.  - Home meds include Coreg 12.5 BID, Crestor 10 mg daily, and Plavix 75 mg daily - Not on aspirin at baseline. Plavix held for surgery. Would resume as soon as felt safe by primary teams.  - Patient denies any chest pain  Chronic diastolic Heart Failure Last Echo 07/2014 showed EF 50-55%, pseudonormal pattern of diastolic dysfunction with elevated filling pressures, no wall motion abnormalities, mild to moderate MR, mod TR, PA peak pressure 52 mm HG.  - BNP on admission was 414 - Home meds include lasix 40 mg daily and coreg 12.5 mg BID - Not decompensated  - Repeat Echo  PAD - Last seen by Dr. Kirke CorinArida in 2015 and noninvasive evaluation showed a normal ABI on the right side and 0.88 on the left. Duplex showed evidence of proximal left common femoral artery stenosis.  - Decided to pursue medical management since symptoms were not lifestyle limiting.  - Currently asymptomatic  Hyperlipidemia - Rosuvastatin 10 mg daily - No LDL on record - Would recheck fasting lipids in the AM  Mild to moderate Mitral valve regurgitation, ischemic - Found in 2015, thought to be ischemic due to occlusion of SVG-OM - On Ace inhibitor - Repeat echo  CKD - Baseline creatinine 1.2-1.5 - Stable  DM2 - Elevated on admission to 302 - A1C 7.1  Pulmonary HTN - Seen on echo in 2015 PA peak pressure 52 mm HG.   For questions or updates, please contact [facility] Please consult [email or web address] for contact info under   Signed, Cadence David StallH Furth, PA-C  06/25/2019 10:43 AM   The patient was seen, examined and discussed with Cadence David StallH Furth, PA-C   and I agree with the above.   85 y.o. female with a hx of pulmonary HTN, HTN, HLD, DM2, PVD, CAD (prior LAD stent occlusion, with progression of CAD to LM disease--CABG), CKD stage III, and Chronic diastolic heart failure, and hypothyroidism who is being seen today for the evaluation of pre-op clearance for possible  cholecystectomy. Te patient was last seen by Dr Kirke CorinArida in 2015. She had an echo done at the time with LVEF 50-55%. She hasn't followed with cardiology since then. The patient remains fairly active despite age, walks with a cane and can certainly walk a block, denies any chest pain, SOB, occassional LE edema controlled with Lasix. She hasn't  been experiencing any orthopnea, PND, palpitations or syncope.   On physical exam she has no JVDs, lungs are CTA, S1,2 with 3/6 systolic murmur, and trivial LE edema.  ECG shows SR, PACs, negative T waves in the inferolateral leads unchanged from prior ECGs in 2015. I have reviewed her echocardiogram, LVEF is 55-60% with no regional wall motion abnormalities, grade 2 diastolic dysfunction with mildly elevated filling pressures, she has mild to moderate aortic stenosis, moderate pulmonary hypertension RVSP 56 mmHg.   Assessment and Plan:  Preoperative evaluation CAD, S/P CABG Mild to moderate aortic stenosis Chronic diastolic CHF Pulmonary hypertension  Given underlying CAD, chronic diastolic CHF the patient is considered an intermediate risk for a moderate risk surgery, however she has no signs of angina, decompensated CHF or severe valvular disease. There is no absolute contraindication for this patient to proceed with surgery. We would recommend to continue using carvedilol in the perioperative period. Avoid excessive fluid resuscitation during surgery as her LVEDP is elevated. We will follow up in the postoperative period.   Tobias AlexanderKatarina Micaiah Remillard, MD 06/25/2019

## 2019-06-25 NOTE — Progress Notes (Addendum)
Patient ID: Margaret AversSudha [name], female   DOB: 03/05/1934, 85 y.o.   MRN: [id number]       Subjective: No new complaints except some diarrhea.  Spoke to son and daughter on the phone.  Objective: Vital signs in last 24 hours: Temp:  [97.6 F (36.4 C)-99.3 F (37.4 C)] 98.3 F (36.8 C) (09/02 0800) Pulse Rate:  [81-128] 86 (09/02 0800) Resp:  [16-20] 17 (09/02 0800) BP: (95-153)/(41-124) 97/49 (09/02 0800) SpO2:  [94 %-100 %] 98 % (09/02 0800) Weight:  [61.6 kg] 61.6 kg (09/02 0408) Last BM Date: 06/25/19  Intake/Output from previous day: 09/01 0701 - 09/02 0700 In: 1770.2 [I.V.:940; IV Piggyback:830.2] Out: -  Intake/Output this shift: No intake/output data recorded.  PE: Abd: soft, minimally tender in RUQ, +BS, ND  Lab Results:  Recent Labs    06/24/19 0603 06/25/19 0248  WBC 12.8* 10.1  HGB 11.2* 9.0*  HCT 35.5* 28.3*  PLT 137* 109*   BMET Recent Labs    06/24/19 0603 06/25/19 0248  NA 133* 137  K 4.0 3.8  CL 99 103  CO2 22 22  GLUCOSE 184* 139*  BUN 25* 26*  CREATININE 1.33* 1.41*  CALCIUM 8.4* 7.9*   PT/INR No results for input(s): LABPROT, INR in the last 72 hours. CMP     Component Value Date/Time   NA 137 06/25/2019 0248   K 3.8 06/25/2019 0248   CL 103 06/25/2019 0248   CO2 22 06/25/2019 0248   GLUCOSE 139 (H) 06/25/2019 0248   BUN 26 (H) 06/25/2019 0248   CREATININE 1.41 (H) 06/25/2019 0248   CALCIUM 7.9 (L) 06/25/2019 0248   PROT 5.5 (L) 06/25/2019 0248   ALBUMIN 2.2 (L) 06/25/2019 0248   AST 13 (L) 06/25/2019 0248   ALT 11 06/25/2019 0248   ALKPHOS 45 06/25/2019 0248   BILITOT 0.6 06/25/2019 0248   GFRNONAA 34 (L) 06/25/2019 0248   GFRAA 40 (L) 06/25/2019 0248   Lipase     Component Value Date/Time   LIPASE 25 06/23/2019 1728       Studies/Results: Nm Hepatobiliary Liver Func  Result Date: 06/24/2019 CLINICAL DATA:  Cholelithiasis EXAM: NUCLEAR MEDICINE HEPATOBILIARY IMAGING TECHNIQUE: Sequential images of the abdomen were  obtained out to 60 minutes following intravenous administration of radiopharmaceutical. RADIOPHARMACEUTICALS:  5.07 mCi Tc-5746m  Choletec IV COMPARISON:  None FINDINGS: Normal tracer clearance from bloodstream indicating normal hepatocellular function. Prompt excretion of tracer into the biliary tree. Small bowel visualized at 15 minutes. At 55 minutes, gallbladder had not visualized. Patient's blood pressure was borderline low, precluding IV morphine administration. Delayed imaging was performed at 1611 hours. No definite delayed visualization of the gallbladder is seen. A tiny focus of tracer to the RIGHT lateral aspect of the CBD is felt to represent the duodenal bulb. IMPRESSION: Patent CBD. Nonvisualization of the gallbladder despite delayed imaging at nearly 4 hours, consistent with acute cholecystitis. Electronically Signed   By: Ulyses SouthwardMark  Boles M.D.   On: 06/24/2019 17:16   Dg Chest Port 1 View  Result Date: 06/23/2019 CLINICAL DATA:  Initial evaluation for acute fever, epigastric pain. EXAM: PORTABLE CHEST 1 VIEW COMPARISON:  Prior radiograph from 07/31/2014. FINDINGS: Median sternotomy wires underlying surgical clips and CABG markers noted. Cardiomegaly, stable. Mediastinal silhouette normal. Aortic atherosclerosis. Lungs mildly hypoinflated. Perihilar vascular congestion without overt pulmonary edema. No pleural effusion. No consolidative airspace opacity. No pneumothorax. Few scattered calcified granulomata noted. No acute osseous abnormality. Soft tissue calcifications overlie the left neck and chest. IMPRESSION:  1. Stable cardiomegaly with mild perihilar vascular congestion without overt pulmonary edema. 2. No other active cardiopulmonary disease. Electronically Signed   By: Jeannine Boga M.D.   On: 06/23/2019 23:25   US Abdomen Limited Ruq  Result Date: 06/24/2019 CLINICAL DATA:  Right upper quadrant pain. EXAM: ULTRASOUND ABDOMEN LIMITED RIGHT UPPER QUADRANT COMPARISON:  None. FINDINGS:  Gallbladder: Distended with multiple gallstones. Borderline wall thickening of 3-4 mm. No pericholecystic fluid. No sonographic Murphy sign noted by sonographer. Common bile duct: Diameter: 3-4 mm, normal. Liver: No focal lesion identified. Within normal limits in parenchymal echogenicity. Portal vein is patent on color Doppler imaging with normal direction of blood flow towards the liver. Other: None. IMPRESSION: 1. Gallstones with borderline wall thickening of 3-4 mm. Consider nuclear medicine HIDA scan if there is clinical concern for acute cholecystitis. No sonographic Murphy sign or pericholecystic fluid. 2. No biliary dilatation. Electronically Signed   By: Keith Rake M.D.   On: 06/24/2019 01:20    Anti-infectives: Anti-infectives (From admission, onward)   Start     Dose/Rate Route Frequency Ordered Stop   06/24/19 2200  cefTRIAXone (ROCEPHIN) 2 g in sodium chloride 0.9 % 100 mL IVPB     2 g 200 mL/hr over 30 Minutes Intravenous Every 24 hours 06/24/19 0314     06/24/19 1000  metroNIDAZOLE (FLAGYL) IVPB 500 mg     500 mg 100 mL/hr over 60 Minutes Intravenous Every 8 hours 06/24/19 0314     06/24/19 0145  metroNIDAZOLE (FLAGYL) IVPB 500 mg     500 mg 100 mL/hr over 60 Minutes Intravenous  Once 06/24/19 0137 06/24/19 0609   06/24/19 0145  cefTRIAXone (ROCEPHIN) 2 g in sodium chloride 0.9 % 100 mL IVPB  Status:  Discontinued     2 g 200 mL/hr over 30 Minutes Intravenous  Once 06/24/19 0137 06/24/19 0137   06/24/19 0145  cefTRIAXone (ROCEPHIN) 1 g in sodium chloride 0.9 % 100 mL IVPB  Status:  Discontinued     1 g 200 mL/hr over 30 Minutes Intravenous  Once 06/24/19 0138 06/24/19 0534   06/23/19 2300  cefTRIAXone (ROCEPHIN) 1 g in sodium chloride 0.9 % 100 mL IVPB     1 g 200 mL/hr over 30 Minutes Intravenous  Once 06/23/19 2249 06/24/19 0244       Assessment/Plan DM Pulmonary HTN HTN CAD, s/p CABG CKD Stage III CHF UTI  Cholecystitis  -HIDA positive.  Long discussion  with son and then daughter regarding this finding.  We discussed surgical intervention as well as treating with abx therapy.  We discussed that surgical resection is the only way to guarantee this does not recur and that just abx should take care of the infection acutely, but this could recur.  They are concerned about her heart issues.  We discussed that before we proceed with surgery, if that's what they decide, we would have her cardiologist come see her for pre-operative clearance.  The brother and sister are discussing what they would like to do currently and they will call back to let us know.  FEN - NPO, likely clears if decide for surgery, while awaiting cards clearance VTE - ok for chemical prophylaxis from our standpoint ID - Rocephin/Flagyl   ADDENDUM:  Clear liquids today, NPO p MN.  Cards to see for clearance while family still decides what they would like to do.  Leaning towards surgery.  LOS: 1 day    Henreitta Cea , [facility] 06/25/2019,  8:13 AM Pager: [phone]

## 2019-06-26 ENCOUNTER — Inpatient Hospital Stay (HOSPITAL_COMMUNITY): Payer: Medicare Other | Admitting: Certified Registered"

## 2019-06-26 ENCOUNTER — Other Ambulatory Visit: Payer: Self-pay

## 2019-06-26 ENCOUNTER — Encounter (HOSPITAL_COMMUNITY): Payer: Self-pay | Admitting: *Deleted

## 2019-06-26 ENCOUNTER — Encounter (HOSPITAL_COMMUNITY)
Admission: EM | Disposition: A | Discharge status: Home Health | Payer: Self-pay | Source: Home / Self Care | Attending: Internal Medicine

## 2019-06-26 DIAGNOSIS — E039 Hypothyroidism, unspecified: Secondary | ICD-10-CM

## 2019-06-26 DIAGNOSIS — E1129 Type 2 diabetes mellitus with other diabetic kidney complication: Secondary | ICD-10-CM

## 2019-06-26 DIAGNOSIS — R000 Tachycardia, unspecified: Secondary | ICD-10-CM

## 2019-06-26 LAB — HEPARIN LEVEL (UNFRACTIONATED): Heparin Unfractionated: <0.1 IU/mL — ABNORMAL LOW (ref 0.30–0.70)

## 2019-06-26 LAB — CBC
HCT: 30 % — ABNORMAL LOW (ref 36.0–46.0)
Hemoglobin: 9.3 g/dL — ABNORMAL LOW (ref 12.0–15.0)
MCH: 30.7 pg (ref 26.0–34.0)
MCHC: 31 g/dL (ref 30.0–36.0)
MCV: 99 fL (ref 80.0–100.0)
Platelets: 131 10*3/uL — ABNORMAL LOW (ref 150–400)
RBC: 3.03 MIL/uL — ABNORMAL LOW (ref 3.87–5.11)
RDW: 13.4 % (ref 11.5–15.5)
WBC: 8.9 10*3/uL (ref 4.0–10.5)
nRBC: 0.2 % (ref 0.0–0.2)

## 2019-06-26 LAB — COMPREHENSIVE METABOLIC PANEL
ALT: 11 U/L (ref 0–44)
AST: 13 U/L — ABNORMAL LOW (ref 15–41)
Albumin: 2.1 g/dL — ABNORMAL LOW (ref 3.5–5.0)
Alkaline Phosphatase: 46 U/L (ref 38–126)
Anion gap: 11 (ref 5–15)
BUN: 22 mg/dL (ref 8–23)
CO2: 19 mmol/L — ABNORMAL LOW (ref 22–32)
Calcium: 7.7 mg/dL — ABNORMAL LOW (ref 8.9–10.3)
Chloride: 107 mmol/L (ref 98–111)
Creatinine, Ser: 1.19 mg/dL — ABNORMAL HIGH (ref 0.44–1.00)
GFR calc Af Amer: 49 mL/min — ABNORMAL LOW (ref >60)
GFR calc non Af Amer: 42 mL/min — ABNORMAL LOW (ref >60)
Glucose, Bld: 126 mg/dL — ABNORMAL HIGH (ref 70–99)
Potassium: 3.5 mmol/L (ref 3.5–5.1)
Sodium: 137 mmol/L (ref 135–145)
Total Bilirubin: 1 mg/dL (ref 0.3–1.2)
Total Protein: 5.7 g/dL — ABNORMAL LOW (ref 6.5–8.1)

## 2019-06-26 LAB — LIPID PANEL
Cholesterol: 71 mg/dL (ref 0–200)
HDL: 28 mg/dL — ABNORMAL LOW (ref >40)
LDL Cholesterol: 31 mg/dL (ref 0–99)
Total CHOL/HDL Ratio: 2.5 RATIO
Triglycerides: 58 mg/dL (ref <150)
VLDL: 12 mg/dL (ref 0–40)

## 2019-06-26 LAB — GLUCOSE, CAPILLARY
Glucose-Capillary: 107 mg/dL — ABNORMAL HIGH (ref 70–99)
Glucose-Capillary: 187 mg/dL — ABNORMAL HIGH (ref 70–99)
Glucose-Capillary: 153 mg/dL — ABNORMAL HIGH (ref 70–99)

## 2019-06-26 SURGERY — CANCELLED PROCEDURE
Anesthesia: General

## 2019-06-26 MED ORDER — ONDANSETRON HCL 4 MG/2ML IJ SOLN
INTRAMUSCULAR | Status: AC
  Filled 2019-06-26: qty 2

## 2019-06-26 MED ORDER — HEPARIN (PORCINE) 25000 UT/250ML-% IV SOLN
850.0000 [IU]/h | INTRAVENOUS | Status: DC
  Filled 2019-06-26: qty 250

## 2019-06-26 MED ORDER — PHENYLEPHRINE 40 MCG/ML (10ML) SYRINGE FOR IV PUSH (FOR BLOOD PRESSURE SUPPORT)
PREFILLED_SYRINGE | INTRAVENOUS | Status: AC
  Filled 2019-06-26: qty 10

## 2019-06-26 MED ORDER — ROCURONIUM BROMIDE 10 MG/ML (PF) SYRINGE
PREFILLED_SYRINGE | INTRAVENOUS | Status: AC
  Filled 2019-06-26: qty 10

## 2019-06-26 MED ORDER — DILTIAZEM HCL 60 MG PO TABS: 60.0000 mg | ORAL_TABLET | Freq: Four times a day (QID) | ORAL | Status: DC

## 2019-06-26 MED ORDER — METOPROLOL TARTRATE 5 MG/5ML IV SOLN
5.0000 mg | Freq: Once | INTRAVENOUS | Status: AC
  Administered 2019-06-26: 5 mg via INTRAVENOUS

## 2019-06-26 MED ORDER — LACTATED RINGERS IV SOLN
INTRAVENOUS | Status: DC
  Administered 2019-06-26: 10:00:00 via INTRAVENOUS

## 2019-06-26 MED ORDER — METOPROLOL TARTRATE 5 MG/5ML IV SOLN
INTRAVENOUS | Status: AC
  Filled 2019-06-26: qty 10

## 2019-06-26 MED ORDER — LACTATED RINGERS IV SOLN
INTRAVENOUS | Status: DC | PRN
  Administered 2019-06-26: 10:00:00 via INTRAVENOUS

## 2019-06-26 MED ORDER — AMIODARONE HCL IN DEXTROSE 360-4.14 MG/200ML-% IV SOLN
60.0000 mg/h | INTRAVENOUS | Status: DC
  Filled 2019-06-26: qty 200

## 2019-06-26 MED ORDER — METRONIDAZOLE IN NACL 5-0.79 MG/ML-% IV SOLN
500.0000 mg | Freq: Three times a day (TID) | INTRAVENOUS | Status: DC
  Administered 2019-06-26 – 2019-06-29 (×9): 500 mg via INTRAVENOUS
  Filled 2019-06-26 (×10): qty 100

## 2019-06-26 MED ORDER — PROMETHAZINE HCL 25 MG/ML IJ SOLN: 6.2500 mg | INTRAMUSCULAR | Status: DC | PRN

## 2019-06-26 MED ORDER — FENTANYL CITRATE (PF) 100 MCG/2ML IJ SOLN
25.0000 ug | INTRAMUSCULAR | Status: DC | PRN
  Administered 2019-06-26: 50 ug via INTRAVENOUS

## 2019-06-26 MED ORDER — OXYCODONE HCL 5 MG PO TABS: 5.0000 mg | ORAL_TABLET | Freq: Once | ORAL | Status: DC | PRN

## 2019-06-26 MED ORDER — OXYCODONE HCL 5 MG/5ML PO SOLN: 5.0000 mg | Freq: Once | ORAL | Status: DC | PRN

## 2019-06-26 MED ORDER — LIDOCAINE 2% (20 MG/ML) 5 ML SYRINGE
INTRAMUSCULAR | Status: AC
  Filled 2019-06-26: qty 5

## 2019-06-26 MED ORDER — DEXAMETHASONE SODIUM PHOSPHATE 10 MG/ML IJ SOLN
INTRAMUSCULAR | Status: AC
  Filled 2019-06-26: qty 1

## 2019-06-26 MED ORDER — SODIUM CHLORIDE 0.9 % IV SOLN
2.0000 g | INTRAVENOUS | Status: DC
  Administered 2019-06-26 – 2019-06-29 (×4): 2 g via INTRAVENOUS
  Filled 2019-06-26: qty 20
  Filled 2019-06-26 (×4): qty 2

## 2019-06-26 MED ORDER — BUPIVACAINE-EPINEPHRINE (PF) 0.25% -1:200000 IJ SOLN
INTRAMUSCULAR | Status: AC
  Filled 2019-06-26: qty 30

## 2019-06-26 MED ORDER — AMIODARONE HCL IN DEXTROSE 360-4.14 MG/200ML-% IV SOLN
30.0000 mg/h | INTRAVENOUS | Status: DC
  Filled 2019-06-26: qty 200

## 2019-06-26 MED ORDER — SODIUM CHLORIDE (PF) 0.9 % IJ SOLN
INTRAMUSCULAR | Status: AC
  Filled 2019-06-26: qty 50

## 2019-06-26 MED ORDER — FENTANYL CITRATE (PF) 100 MCG/2ML IJ SOLN
INTRAMUSCULAR | Status: AC
  Filled 2019-06-26: qty 2

## 2019-06-26 MED ORDER — AMIODARONE LOAD VIA INFUSION
150.0000 mg | Freq: Once | INTRAVENOUS | Status: DC
  Filled 2019-06-26: qty 83.34

## 2019-06-26 MED ORDER — PROPOFOL 10 MG/ML IV BOLUS
INTRAVENOUS | Status: AC
  Filled 2019-06-26: qty 20

## 2019-06-26 MED ORDER — HEPARIN BOLUS VIA INFUSION
3500.0000 [IU] | Freq: Once | INTRAVENOUS | Status: DC
  Filled 2019-06-26: qty 3500

## 2019-06-26 MED ORDER — CARVEDILOL 6.25 MG PO TABS
6.2500 mg | ORAL_TABLET | Freq: Two times a day (BID) | ORAL | Status: DC
  Administered 2019-06-26 – 2019-06-30 (×7): 6.25 mg via ORAL
  Filled 2019-06-26 (×8): qty 1

## 2019-06-26 MED ORDER — SUCCINYLCHOLINE CHLORIDE 200 MG/10ML IV SOSY
PREFILLED_SYRINGE | INTRAVENOUS | Status: AC
  Filled 2019-06-26: qty 10

## 2019-06-26 MED ORDER — DILTIAZEM HCL 25 MG/5ML IV SOLN
10.0000 mg | Freq: Once | INTRAVENOUS | Status: DC
  Filled 2019-06-26: qty 5

## 2019-06-26 MED ORDER — FENTANYL CITRATE (PF) 100 MCG/2ML IJ SOLN
INTRAMUSCULAR | Status: DC | PRN
  Administered 2019-06-26: 25 ug via INTRAVENOUS

## 2019-06-26 MED ORDER — FENTANYL CITRATE (PF) 250 MCG/5ML IJ SOLN
INTRAMUSCULAR | Status: AC
  Filled 2019-06-26: qty 5

## 2019-06-26 SURGICAL SUPPLY — 49 items
APL PRP STRL LF DISP 70% ISPRP (MISCELLANEOUS)
APL SKNCLS STERI-STRIP NONHPOA (GAUZE/BANDAGES/DRESSINGS)
APPLIER CLIP ROT 10 11.4 M/L (STAPLE)
APR CLP MED LRG 11.4X10 (STAPLE)
BAG SPEC RTRVL 10 TROC 200 (ENDOMECHANICALS)
BAG SPEC RTRVL LRG 6X4 10 (ENDOMECHANICALS)
BENZOIN TINCTURE PRP APPL 2/3 (GAUZE/BANDAGES/DRESSINGS) ×1 IMPLANT
BLADE CLIPPER SURG (BLADE) IMPLANT
CANISTER SUCT 3000ML PPV (MISCELLANEOUS) ×1 IMPLANT
CHLORAPREP W/TINT 26 (MISCELLANEOUS) ×1 IMPLANT
CLIP APPLIE ROT 10 11.4 M/L (STAPLE) ×1 IMPLANT
CLOSURE WOUND 1/2 X4 (GAUZE/BANDAGES/DRESSINGS)
COVER MAYO STAND STRL (DRAPES) ×1 IMPLANT
COVER SURGICAL LIGHT HANDLE (MISCELLANEOUS) ×1 IMPLANT
COVER WAND RF STERILE (DRAPES) ×1 IMPLANT
DRAPE C-ARM 42X72 X-RAY (DRAPES) ×1 IMPLANT
DRSG TEGADERM 2-3/8X2-3/4 SM (GAUZE/BANDAGES/DRESSINGS) ×3 IMPLANT
DRSG TEGADERM 4X4.75 (GAUZE/BANDAGES/DRESSINGS) ×1 IMPLANT
ELECT REM PT RETURN 9FT ADLT (ELECTROSURGICAL)
ELECTRODE REM PT RTRN 9FT ADLT (ELECTROSURGICAL) ×1 IMPLANT
GAUZE SPONGE 2X2 8PLY STRL LF (GAUZE/BANDAGES/DRESSINGS) ×1 IMPLANT
GLOVE BIO SURGEON STRL SZ7 (GLOVE) ×1 IMPLANT
GLOVE BIOGEL PI IND STRL 7.5 (GLOVE) ×1 IMPLANT
GLOVE BIOGEL PI INDICATOR 7.5 (GLOVE)
GOWN STRL REUS W/ TWL LRG LVL3 (GOWN DISPOSABLE) ×3 IMPLANT
GOWN STRL REUS W/TWL LRG LVL3 (GOWN DISPOSABLE)
KIT BASIN OR (CUSTOM PROCEDURE TRAY) ×1 IMPLANT
KIT TURNOVER KIT B (KITS) ×1 IMPLANT
NS IRRIG 1000ML POUR BTL (IV SOLUTION) ×1 IMPLANT
PAD ARMBOARD 7.5X6 YLW CONV (MISCELLANEOUS) ×1 IMPLANT
POUCH RETRIEVAL ECOSAC 10 (ENDOMECHANICALS) IMPLANT
POUCH RETRIEVAL ECOSAC 10MM (ENDOMECHANICALS)
POUCH SPECIMEN RETRIEVAL 10MM (ENDOMECHANICALS) IMPLANT
SCISSORS LAP 5X35 DISP (ENDOMECHANICALS) ×1 IMPLANT
SET CHOLANGIOGRAPH 5 50 .035 (SET/KITS/TRAYS/PACK) ×1 IMPLANT
SET IRRIG TUBING LAPAROSCOPIC (IRRIGATION / IRRIGATOR) ×1 IMPLANT
SET TUBE SMOKE EVAC HIGH FLOW (TUBING) ×1 IMPLANT
SLEEVE ENDOPATH XCEL 5M (ENDOMECHANICALS) ×1 IMPLANT
SPECIMEN JAR SMALL (MISCELLANEOUS) ×1 IMPLANT
SPONGE GAUZE 2X2 STER 10/PKG (GAUZE/BANDAGES/DRESSINGS)
STRIP CLOSURE SKIN 1/2X4 (GAUZE/BANDAGES/DRESSINGS) ×1 IMPLANT
SUT MNCRL AB 4-0 PS2 18 (SUTURE) ×1 IMPLANT
TOWEL GREEN STERILE (TOWEL DISPOSABLE) ×1 IMPLANT
TOWEL GREEN STERILE FF (TOWEL DISPOSABLE) ×1 IMPLANT
TRAY LAPAROSCOPIC MC (CUSTOM PROCEDURE TRAY) ×1 IMPLANT
TROCAR XCEL BLUNT TIP 100MML (ENDOMECHANICALS) ×1 IMPLANT
TROCAR XCEL NON-BLD 11X100MML (ENDOMECHANICALS) ×1 IMPLANT
TROCAR XCEL NON-BLD 5MMX100MML (ENDOMECHANICALS) ×1 IMPLANT
WATER STERILE IRR 1000ML POUR (IV SOLUTION) ×1 IMPLANT

## 2019-06-26 NOTE — Transfer of Care (Signed)
Immediate Anesthesia Transfer of Care Note  Patient: Kaleia Longhi  Procedure(s) Performed: Cancelled Procedure  Patient Location: PACU  Anesthesia Type:General  Level of Consciousness: awake, alert  and oriented  Airway & Oxygen Therapy: Patient Spontanous Breathing and Patient connected to nasal cannula oxygen  Post-op Assessment: Report given to RN, Post -op Vital signs reviewed and stable and Patient moving all extremities X 4  Post vital signs: Reviewed and stable  Last Vitals:  Vitals Value Taken Time  BP 164/118 06/26/19 1200  Temp    Pulse 143 06/26/19 1204  Resp 27 06/26/19 1204  SpO2 82 % 06/26/19 1204  Vitals shown include unvalidated device data.  Last Pain:  Vitals:   06/26/19 0530  TempSrc: Oral  PainSc:       Patients Stated Pain Goal: 0 (27/51/70 0174)  Complications: No apparent anesthesia complications

## 2019-06-26 NOTE — Op Note (Signed)
Patient transferred to OR table Went into new atrial fibrillation with RVR.  Patient is already intermediate risk for perioperative cardiac complications.  Will cancel surgery today and ask cardiology to see the patient again for rate control.  Discussed with family.  Margaret [name]. [name], MD, [facility]  General/ Trauma Surgery Beeper [phone]  06/26/2019 12:09 PM

## 2019-06-26 NOTE — Anesthesia Preprocedure Evaluation (Addendum)
Anesthesia Evaluation  Patient identified by MRN, date of birth, ID band Patient awake    Reviewed: Allergy & Precautions, NPO status , Patient's Chart, lab work & pertinent test results  History of Anesthesia Complications Negative for: history of anesthetic complications  Airway Mallampati: II  TM Distance: >3 FB Neck ROM: Full    Dental no notable dental hx.    Pulmonary neg pulmonary ROS,    Pulmonary exam normal        Cardiovascular + CAD, + Cardiac Stents (2005), + CABG (2011), + Peripheral Vascular Disease and +CHF  Normal cardiovascular exam  TTE 06/25/19: EF 55-60%, elevated left atrial and left ventricular end-diastolic pressures, RV systolic pressure moderately elevated with an estimated pressure of 55.64mmHg, mild LAE, mild RAE, moderate TR, mild-moderate AS   Neuro/Psych negative neurological ROS     GI/Hepatic negative GI ROS, Neg liver ROS,   Endo/Other  diabetes, Type 2Hypothyroidism   Renal/GU Renal InsufficiencyRenal diseaseCr 1.19     Musculoskeletal negative musculoskeletal ROS (+)   Abdominal   Peds  Hematology  (+) anemia , Hgb 9.3, plt 131   Anesthesia Other Findings Day of surgery medications reviewed with the patient.  Reproductive/Obstetrics                            Anesthesia Physical Anesthesia Plan  ASA: III  Anesthesia Plan: General   Post-op Pain Management:    Induction: Intravenous  PONV Risk Score and Plan: 4 or greater and Treatment may vary due to age or medical condition, Ondansetron and Dexamethasone  Airway Management Planned: Oral ETT  Additional Equipment: None  Intra-op Plan:   Post-operative Plan: Extubation in OR  Informed Consent: I have reviewed the patients History and Physical, chart, labs and discussed the procedure including the risks, benefits and alternatives for the proposed anesthesia with the patient or authorized  representative who has indicated his/her understanding and acceptance.     Dental advisory given  Plan Discussed with: CRNA  Anesthesia Plan Comments: (CASE CANCELLED after entrance to OR for new Afib with RVR in 130s-140s. Plan for cardiology consult and rescheduling procedure.  Daiva Huge, MD)      Anesthesia Quick Evaluation

## 2019-06-26 NOTE — Progress Notes (Signed)
PROGRESS NOTE    Margaret [name]  ERX:[id number] DOB: 12-Mar-1934 DOA: 06/23/2019 PCP: Margaret Ladd, MD   Brief Narrative:  Margaret [name] is a 85 y.o. female with medical history significant of hyperlipidemia, diabetes mellitus, hypothyroidism, vitamin B12 deficiency, PVD, pulmonary hypertension, CAD, CABG, sCHF with EF of 45%, CKD stage III, who presents with RUQ abdominal pain. Pt speaks gujarati and hindi, and little [place]. The history is obtained with her daughter's help. Per her daughter, patient started having chest pain since yesterday, which is located in the right upper quadrant, constant, sharp, radiating to the back.  Patient does not have nausea vomiting or diarrhea.  Patient had mild fever of 100.  Patient was seen in urgent care, and was sent to ED for further evaluation treatment.  Patient does not have chest pain, cough, shortness of breath.  Patient has mild discomfort on urination. No unilateral weakness. In ED pt was found to have WBC 15.7, positive urinalysis (turbid appearance, large amount of leukocyte, many bacteria, WBC>50), pending COVID-19 test, lactic acid 1.8, stable renal function, sodium 128, currently temperature 99, blood pressure 138/82, heart rate 80 to 90s, oxygen sat 98% on room air.  Chest x-ray showed stable cardiomegaly with mild perihilar vascular congestion.     Assessment & Plan:   Principal Problem:   Abdominal pain Active Problems:   Vitamin B12 deficiency   Chronic diastolic CHF (congestive heart failure), NYHA class 2 (HCC)   Hyperlipidemia with target LDL less than 70   CAD (coronary artery disease) of artery bypass graft   CKD (chronic kidney disease), stage III (HCC)   UTI (urinary tract infection)   Hyponatremia   Type II diabetes mellitus with renal manifestations (HCC)   Sepsis (HCC)   Hypothyroidism    Acute onset intractable abdominal pain, POA, resolved Rule out gastritis, peptic ulcer disease, gastroenteritis:  -HIDA scan consistent  with acute cholecystitis. -US-RUQ showed gallstones with borderline wall thickening of 3-4 mm. No sonographic Murphy sign or pericholecystic fluid. No biliary dilatation. Lipase normal 25.  -Cardiology consulted from a surgical standpoint for preoperative clearance given cardiac history as below - repeat echo pending -Tentatively planned laparoscopic cholecystectomy today on 06/26/2019, unfortunately patient noted to go into A. fib with RVR prior to surgery, patient subsequently placed on amiodarone and sent back to the floor as below -Currently on Rocephin and Flagyl for coverage of intra-abdominal infection as well as UTI -PRN Zofran for nausea, morphine for pain -Follow cultures  Sepsis secondary to UTI (urinary tract infection)  POA:  -Patient meets criteria for sepsis with leukocytosis and tachypnea (RR 22).  -Abx: rocephin and flagyl as above -Cultures, currently preliminary negative -Continue IV fluids, sepsis criteria resolved given no longer tachypneic, tachycardic, or with leukocytosis  A. fib with RVR, questionably new onset versus paroxysmal versus provoked -Cardiology following, appreciate insight and recommendations -Currently placed on amiodarone bolus/drip per their recommendations -Patient's vitals stable, asymptomatic although previously noted "palpitations" per surgery in the OR -We will have to have further discussion about possible need for anticoagulation should patient remain in A. Fib; although does not carry a formal diagnosis of A. fib per family members or chart review  Chronic diastolic CHF (congestive heart failure), NYHA class 2 (HCC), not in acute exacerbation:  -Repeat echo as below -Cardiology following as above -Appears euvolemic  Hyperlipidemia with target LDL less than 70: -Continue home crestor  CAD (coronary artery disease) of artery bypass graft: -continue Crestor, Coreg -Hold Plavix in case patient needs surgery -  Questionable stenting -at  bedside this morning patient stated that she had recalled cardiology placing something in her heart years ago confirmed the word "stent" was used.  CKD (chronic kidney disease), stage III [facility](HCC):  Patient had a creatinine 2.09 on 09/02/2014.   Her creatinine is 1.4 and stable Follow labs  Type II diabetes mellitus with renal manifestations, moderately well controlled: -Home meds include:Tradjenta, glipizide, metformin -Continue SSI, hypoglycemic protocol - A1c 7.1 currently  Hyponatremia, likely hypovolemic in nature given diuretics:  -Currently holding Lasix - follow morning labs, -TSH within normal limits  Hypothyroidism: -Continue Synthroid  DVT prophylaxis: SCDs, perioperatively Code Status: Full Family Communication: Discussed with patient at bedside today, family not present, will attempt to call this afternoon. Disposition Plan: Pending further evaluation and treatment, tentative cholecystectomy in the next 24 to 48 hours pending clinical course and cardiac clearance.  Disposition once clinically stable likely discharge back home with family again pending clinical course.  Consultants:   General surgery, cardiology  Procedures:   Tentative laparoscopic cholecystectomy pending further evaluation  Antimicrobials:  Rocephin, Flagyl  Subjective: No acute issues or events overnight, patient did have notable symptomatic A. fib with RVR as above according palpitations and surgery but otherwise denies chest pain, shortness of breath, nausea, vomiting, diarrhea, constipation, headache, fevers, chills.  Objective: Vitals:   06/25/19 0800 06/25/19 1651 06/25/19 2113 06/26/19 0530  BP: (!) 97/49 111/68 (!) 103/59 (!) 122/53  Pulse: 86 89 87 86  Resp: 17 20 16 16   Temp: 98.3 F (36.8 C) 98.2 F (36.8 C) 98 F (36.7 C) 98 F (36.7 C)  TempSrc: Oral Oral Oral Oral  SpO2: 98% 100% 100% 100%  Weight:      Height:        Intake/Output Summary (Last 24 hours) at 06/26/2019  0850 Last data filed at 06/26/2019 0530 Gross per 24 hour  Intake 1110 ml  Output 600 ml  Net 510 ml   Filed Weights   06/23/19 1720 06/24/19 0652 06/25/19 0408  Weight: 63 kg 57.6 kg 61.6 kg    Examination:  General:  Pleasantly resting in bed, No acute distress. HEENT:  Normocephalic atraumatic.  Sclerae nonicteric, noninjected.  Extraocular movements intact bilaterally. Neck:  Without mass or deformity.  Trachea is midline. Lungs:  Clear to auscultate bilaterally without rhonchi, wheeze, or rales. Heart: Irregularly irregular rate and rhythm.  Without murmurs, rubs, or gallops. Abdomen:  Soft, nontender, nondistended.  Without guarding or rebound.  Negative Murphy's, negative epigastric pain with deep palpation Extremities: Without cyanosis, clubbing, edema, or obvious deformity. Vascular:  Dorsalis pedis and posterior tibial pulses palpable bilaterally. Skin:  Warm and dry, no erythema, no ulcerations.  Data Reviewed: I have personally reviewed following labs and imaging studies  CBC: Recent Labs  Lab 06/23/19 1728 06/24/19 0603 06/25/19 0248 06/26/19 0402  WBC 15.7* 12.8* 10.1 8.9  HGB 11.9* 11.2* 9.0* 9.3*  HCT 37.3 35.5* 28.3* 30.0*  MCV 94.9 95.2 96.6 99.0  PLT 162 137* 109* 131*   Basic Metabolic Panel: Recent Labs  Lab 06/23/19 1728 06/24/19 0603 06/25/19 0248 06/26/19 0402  NA 128* 133* 137 137  K 4.0 4.0 3.8 3.5  CL 92* 99 103 107  CO2 24 22 22  19*  GLUCOSE 302* 184* 139* 126*  BUN 22 25* 26* 22  CREATININE 1.36* 1.33* 1.41* 1.19*  CALCIUM 8.9 8.4* 7.9* 7.7*   GFR: Estimated Creatinine Clearance: 29.3 mL/min (A) (by C-G formula based on SCr of 1.19 mg/dL (H)). Liver  Function Tests: Recent Labs  Lab 06/23/19 1728 06/25/19 0248 06/26/19 0402  AST 19 13* 13*  ALT 16 11 11   ALKPHOS 59 45 46  BILITOT 1.1 0.6 1.0  PROT 7.3 5.5* 5.7*  ALBUMIN 3.2* 2.2* 2.1*   Recent Labs  Lab 06/23/19 1728  LIPASE 25   HbA1C: Recent Labs    06/24/19  0603  HGBA1C 7.1*   CBG: Recent Labs  Lab 06/25/19 0804 06/25/19 1226 06/25/19 1730 06/25/19 2108 06/26/19 0806  GLUCAP 112* 114* 100* 155* 107*   Lipid Profile: Recent Labs    06/26/19 0402  CHOL 71  HDL 28*  LDLCALC 31  TRIG 58  CHOLHDL 2.5   Thyroid Function Tests: Recent Labs    06/24/19 0603  TSH 1.435   Sepsis Labs: Recent Labs  Lab 06/23/19 2345  LATICACIDVEN 1.8    Recent Results (from the past 240 hour(s))  Blood culture (routine x 2)     Status: None (Preliminary result)   Collection Time: 06/23/19 11:35 PM   Specimen: BLOOD  Result Value Ref Range Status   Specimen Description BLOOD RIGHT ARM  Final   Special Requests   Final    BOTTLES DRAWN AEROBIC AND ANAEROBIC Blood Culture results may not be optimal due to an inadequate volume of blood received in culture bottles   Culture   Final    NO GROWTH 1 DAY Performed at [facility], [address] [place], [place] [address]    Report Status PENDING  Incomplete  Blood culture (routine x 2)     Status: None (Preliminary result)   Collection Time: 06/23/19 11:40 PM   Specimen: BLOOD  Result Value Ref Range Status   Specimen Description BLOOD LEFT ARM  Final   Special Requests   Final    BOTTLES DRAWN AEROBIC AND ANAEROBIC Blood Culture adequate volume   Culture   Final    NO GROWTH 1 DAY Performed at [facility], [address] [place], [place] [address]    Report Status PENDING  Incomplete  SARS Coronavirus 2 [facility](Hospital order, Performed in [facility] hospital lab) Nasopharyngeal Nasopharyngeal Swab     Status: None   Collection Time: 06/24/19  3:05 AM   Specimen: Nasopharyngeal Swab  Result Value Ref Range Status   SARS Coronavirus 2 NEGATIVE NEGATIVE Final    Comment: (NOTE) If result is NEGATIVE SARS-CoV-2 target nucleic acids are NOT DETECTED. The SARS-CoV-2 RNA is generally detectable in upper and lower  respiratory specimens during the acute phase of infection. The  lowest  concentration of SARS-CoV-2 viral copies this assay can detect is 250  copies / mL. A negative result does not preclude SARS-CoV-2 infection  and should not be used as the sole basis for treatment or other  patient management decisions.  A negative result may occur with  improper specimen collection / handling, submission of specimen other  than nasopharyngeal swab, presence of viral mutation(s) within the  areas targeted by this assay, and inadequate number of viral copies  (<250 copies / mL). A negative result must be combined with clinical  observations, patient history, and epidemiological information. If result is POSITIVE SARS-CoV-2 target nucleic acids are DETECTED. The SARS-CoV-2 RNA is generally detectable in upper and lower  respiratory specimens dur ing the acute phase of infection.  Positive  results are indicative of active infection with SARS-CoV-2.  Clinical  correlation with patient history and other diagnostic information is  necessary to determine patient  infection status.  Positive results do  not rule out bacterial infection or co-infection with other viruses. If result is PRESUMPTIVE POSTIVE SARS-CoV-2 nucleic acids MAY BE PRESENT.   A presumptive positive result was obtained on the submitted specimen  and confirmed on repeat testing.  While 2019 novel coronavirus  (SARS-CoV-2) nucleic acids may be present in the submitted sample  additional confirmatory testing may be necessary for epidemiological  and / or clinical management purposes  to differentiate between  SARS-CoV-2 and other Sarbecovirus currently known to infect humans.  If clinically indicated additional testing with an alternate test  methodology [phone]) is advised. The SARS-CoV-2 RNA is generally  detectable in upper and lower respiratory sp ecimens during the acute  phase of infection. The expected result is Negative. Fact Sheet for Patients:  StrictlyIdeas.no  Fact Sheet for Healthcare Providers: BankingDealers.co.za This test is not yet approved or cleared by the [place] FDA and has been authorized for detection and/or diagnosis of SARS-CoV-2 by FDA under an Emergency Use Authorization (EUA).  This EUA will remain in effect (meaning this test can be used) for the duration of the COVID-19 declaration under Section 564(b)(1) of the Act, 21 U.S.C. section 360bbb-3(b)(1), unless the authorization is terminated or revoked sooner. Performed at [facility], [place] [address], [place], [place] [address]   Gastrointestinal Panel by PCR , Stool     Status: None   Collection Time: 06/24/19 11:52 PM   Specimen: STOOL  Result Value Ref Range Status   Campylobacter species NOT DETECTED NOT DETECTED Final   Plesimonas shigelloides NOT DETECTED NOT DETECTED Final   Salmonella species NOT DETECTED NOT DETECTED Final   Yersinia enterocolitica NOT DETECTED NOT DETECTED Final   Vibrio species NOT DETECTED NOT DETECTED Final   Vibrio cholerae NOT DETECTED NOT DETECTED Final   Enteroaggregative E coli (EAEC) NOT DETECTED NOT DETECTED Final   Enteropathogenic E coli (EPEC) NOT DETECTED NOT DETECTED Final   Enterotoxigenic E coli (ETEC) NOT DETECTED NOT DETECTED Final   Shiga like toxin producing E coli (STEC) NOT DETECTED NOT DETECTED Final   Shigella/Enteroinvasive E coli (EIEC) NOT DETECTED NOT DETECTED Final   Cryptosporidium NOT DETECTED NOT DETECTED Final   Cyclospora cayetanensis NOT DETECTED NOT DETECTED Final   Entamoeba histolytica NOT DETECTED NOT DETECTED Final   Giardia lamblia NOT DETECTED NOT DETECTED Final   Adenovirus F40/41 NOT DETECTED NOT DETECTED Final   Astrovirus NOT DETECTED NOT DETECTED Final   Norovirus GI/GII NOT DETECTED NOT DETECTED Final   Rotavirus A NOT DETECTED NOT DETECTED Final   Sapovirus (I, II, IV, and V) NOT DETECTED NOT DETECTED Final    Comment: Performed at [facility], [address] [place], [place] [address]  C Difficile Quick Screen w PCR reflex     Status: None   Collection Time: 06/24/19 11:52 PM   Specimen: STOOL  Result Value Ref Range Status   C Diff antigen NEGATIVE NEGATIVE Final   C Diff toxin NEGATIVE NEGATIVE Final   C Diff interpretation No C. difficile detected.  Final    Comment: Performed at [facility], [place] [address], [place], [place] [address]  Surgical pcr screen     Status: Abnormal   Collection Time: 06/25/19  4:16 AM   Specimen: Nasal Mucosa; Nasal Swab  Result Value Ref Range Status   MRSA, PCR NEGATIVE NEGATIVE Final   Staphylococcus aureus POSITIVE (A) NEGATIVE Final    Comment: (NOTE) The Xpert SA  Assay (FDA approved for NASAL specimens in patients 83 years of age and older), is one component of a comprehensive surveillance program. It is not intended to diagnose infection nor to guide or monitor treatment. Performed at [facility], [address] [place], [place] [address]          Radiology Studies: Nm Hepatobiliary Liver Func  Result Date: 06/24/2019 CLINICAL DATA:  Cholelithiasis EXAM: NUCLEAR MEDICINE HEPATOBILIARY IMAGING TECHNIQUE: Sequential images of the abdomen were obtained out to 60 minutes following intravenous administration of radiopharmaceutical. RADIOPHARMACEUTICALS:  5.07 mCi [id number]  Choletec IV COMPARISON:  None FINDINGS: Normal tracer clearance from bloodstream indicating normal hepatocellular function. Prompt excretion of tracer into the biliary tree. Small bowel visualized at 15 minutes. At 55 minutes, gallbladder had not visualized. Patient's blood pressure was borderline low, precluding IV morphine administration. Delayed imaging was performed at 1611 hours. No definite delayed visualization of the gallbladder is seen. A tiny focus of tracer to the RIGHT lateral aspect of the CBD is felt to represent the duodenal bulb. IMPRESSION: Patent CBD. Nonvisualization of the gallbladder despite  delayed imaging at nearly 4 hours, consistent with acute cholecystitis. Electronically Signed   By: Ulyses SouthwardMark  Boles M.D.   On: 06/24/2019 17:16        Scheduled Meds: . carvedilol  12.5 mg Oral BID WC  . Chlorhexidine Gluconate Cloth  6 each Topical Daily  . insulin aspart  0-5 Units Subcutaneous QHS  . insulin aspart  0-9 Units Subcutaneous TID WC  . levothyroxine  125 mcg Oral QAC breakfast  . mupirocin ointment  1 application Nasal BID  . rosuvastatin  10 mg Oral QPC supper  . sodium chloride flush  3 mL Intravenous Once  . vitamin B-12  5,000 mcg Oral Daily   Continuous Infusions: . sodium chloride 75 mL/hr at 06/26/19 0600  . cefTRIAXone (ROCEPHIN)  IV Stopped (06/25/19 2304)  . metronidazole Stopped (06/26/19 0216)     LOS: 2 days   Time spent: 35min  Azucena FallenWilliam C Kasir Hallenbeck, DO [facility]  If 7PM-7AM, please contact night-coverage [email or web address] Password [email or web address] 06/26/2019, 8:50 AM

## 2019-06-26 NOTE — Progress Notes (Signed)
Pre-op report called and given to Serena Colonel, RN in Short Stay and made aware that coreg was not given this am per Holli Humbles, MD. All questions answered to satisfaction. OR personnel transported pt off the unit via bed.

## 2019-06-26 NOTE — Progress Notes (Signed)
Pt returned to 6N26 from PACU. Received report from [place], Therapist, sports. Will continue to monitor.

## 2019-06-26 NOTE — Progress Notes (Addendum)
Progress Note  Patient Name: Margaret AversSudha [name] Date of Encounter: 06/26/2019  Primary Cardiologist: Lance MussJayadeep Varanasi, MD   Subjective   Patient is awaiting surgery. She denies chest pain or sob.   Inpatient Medications    Scheduled Meds:  carvedilol  12.5 mg Oral BID WC   Chlorhexidine Gluconate Cloth  6 each Topical Daily   insulin aspart  0-5 Units Subcutaneous QHS   insulin aspart  0-9 Units Subcutaneous TID WC   levothyroxine  125 mcg Oral QAC breakfast   mupirocin ointment  1 application Nasal BID   rosuvastatin  10 mg Oral QPC supper   sodium chloride flush  3 mL Intravenous Once   vitamin B-12  5,000 mcg Oral Daily   Continuous Infusions:  sodium chloride 75 mL/hr at 06/26/19 0600   cefTRIAXone (ROCEPHIN)  IV Stopped (06/25/19 2304)   metronidazole Stopped (06/26/19 0216)   PRN Meds: acetaminophen **OR** acetaminophen, gabapentin, morphine injection, ondansetron (ZOFRAN) IV   Vital Signs    Vitals:   06/25/19 0800 06/25/19 1651 06/25/19 2113 06/26/19 0530  BP: (!) 97/49 111/68 (!) 103/59 (!) 122/53  Pulse: 86 89 87 86  Resp: 17 20 16 16   Temp: 98.3 F (36.8 C) 98.2 F (36.8 C) 98 F (36.7 C) 98 F (36.7 C)  TempSrc: Oral Oral Oral Oral  SpO2: 98% 100% 100% 100%  Weight:      Height:        Intake/Output Summary (Last 24 hours) at 06/26/2019 0815 Last data filed at 06/26/2019 0530 Gross per 24 hour  Intake 1110 ml  Output 600 ml  Net 510 ml   Last 3 Weights 06/25/2019 06/24/2019 06/23/2019  Weight (lbs) 135 lb 12.9 oz 126 lb 15.8 oz 139 lb  Weight (kg) 61.6 kg 57.6 kg 63.05 kg      Telemetry    Not on telemetry  ECG     No new- Personally Reviewed  Physical Exam   GEN: No acute distress.   Neck: No JVD Cardiac: RRR, systolic murmurs, rubs, or gallops.  Respiratory: Clear to auscultation bilaterally. GI: Soft, nontender, non-distended  MS: Mild edema; No deformity. Neuro:  Nonfocal  Psych: Normal affect   Labs    High Sensitivity  Troponin:  No results for input(s): TROPONINIHS in the last 720 hours.    Chemistry Recent Labs  Lab 06/23/19 1728 06/24/19 0603 06/25/19 0248 06/26/19 0402  NA 128* 133* 137 137  K 4.0 4.0 3.8 3.5  CL 92* 99 103 107  CO2 24 22 22  19*  GLUCOSE 302* 184* 139* 126*  BUN 22 25* 26* 22  CREATININE 1.36* 1.33* 1.41* 1.19*  CALCIUM 8.9 8.4* 7.9* 7.7*  PROT 7.3  --  5.5* 5.7*  ALBUMIN 3.2*  --  2.2* 2.1*  AST 19  --  13* 13*  ALT 16  --  11 11  ALKPHOS 59  --  45 46  BILITOT 1.1  --  0.6 1.0  GFRNONAA 36* 37* 34* 42*  GFRAA 42* 43* 40* 49*  ANIONGAP 12 12 12 11      Hematology Recent Labs  Lab 06/24/19 0603 06/25/19 0248 06/26/19 0402  WBC 12.8* 10.1 8.9  RBC 3.73* 2.93* 3.03*  HGB 11.2* 9.0* 9.3*  HCT 35.5* 28.3* 30.0*  MCV 95.2 96.6 99.0  MCH 30.0 30.7 30.7  MCHC 31.5 31.8 31.0  RDW 13.5 13.6 13.4  PLT 137* 109* 131*    BNP Recent Labs  Lab 06/24/19 0603  BNP 414.2*  DDimer No results for input(s): DDIMER in the last 168 hours.   Radiology    Nm Hepatobiliary Liver Func  Result Date: 06/24/2019 CLINICAL DATA:  Cholelithiasis EXAM: NUCLEAR MEDICINE HEPATOBILIARY IMAGING TECHNIQUE: Sequential images of the abdomen were obtained out to 60 minutes following intravenous administration of radiopharmaceutical. RADIOPHARMACEUTICALS:  5.07 mCi Tc-62m  Choletec IV COMPARISON:  None FINDINGS: Normal tracer clearance from bloodstream indicating normal hepatocellular function. Prompt excretion of tracer into the biliary tree. Small bowel visualized at 15 minutes. At 55 minutes, gallbladder had not visualized. Patient's blood pressure was borderline low, precluding IV morphine administration. Delayed imaging was performed at 1611 hours. No definite delayed visualization of the gallbladder is seen. A tiny focus of tracer to the RIGHT lateral aspect of the CBD is felt to represent the duodenal bulb. IMPRESSION: Patent CBD. Nonvisualization of the gallbladder despite delayed  imaging at nearly 4 hours, consistent with acute cholecystitis. Electronically Signed   By: Lavonia Dana M.D.   On: 06/24/2019 17:16    Cardiac Studies   Echo 06/25/19 IMPRESSIONS  1. The left ventricle has normal systolic function, with an ejection fraction of 55-60%. The cavity size was normal. Left ventricular diastolic Doppler parameters are consistent with pseudonormalization. Elevated left atrial and left ventricular  end-diastolic pressures.  2. The right ventricle has normal systolic function. The cavity was normal. There is no increase in right ventricular wall thickness. Right ventricular systolic pressure is moderately elevated with an estimated pressure of 55.6 mmHg.  3. Left atrial size was mildly dilated.  4. Right atrial size was mildly dilated.  5. The mitral valve is degenerative.  6. Tricuspid valve regurgitation is moderate.  7. The aortic valve is tricuspid. Moderate thickening of the aortic valve. Moderate calcification of the aortic valve. Mild-moderate stenosis of the aortic valve.  8. The aorta is normal unless otherwise noted.  Patient Profile     85 y.o. female with a hx of pulmonary HTN, HTN, HLD, DM2, PVD, CAD (prior LAD stent occlusion, with progression of CAD to LM disease--CABG), CKD stage III, and Chronic diastolic heart failure, and hypothyroidism who is being seen today for the evaluation of pre-op clearance for possible cholecystectomy at the request of Dr. Avon Gully.   Assessment & Plan    Pre-op Clearance Cardiology was consulted for pre-op clearance for possible cholecystectomy. Patient had a positive HIDA scan yesterday and surgery is being recommended. Patient has a history of pulmonary HTN, HTN, HLD, CAD s/p CABG, CKD stage III, and CHF.  - Patient has not been seen by cardiology since 2015. At that time she was hospitalized for acute respiratory failure and acute heart failure. Patient had previous cath, prior LAD stent occlusion, CABG in 2011. Repeat  cath showed severe underlying disease and severe MR thought to be ischemic. TEE showed EF 50-55% mild to moderate MR and PA peak pressure 52 mm HG. Patient was lost to follows as she was traveling back and forth from [place] to the [place], but reports further hospitalizations for pulmonary edema and heart failure. Patient had been taking  Coreg 12.5 BID, Crestor 10 mg daily, Plavix 75 mg daily,  lasix 40 mg daily. - Patient denies any ischemic symptoms. She denies chest pain at rest or with exertion.  - She reports her last hospital admission was 8 months ago in [place] for acute heart failure/pulmonary edema. She takes lasix daily and has not had recent swelling or worsened sob.  - Activity level is mild to moderate. Patient has  sob on exertion at baseline and is only able to walk 1-2 blocks before becoming winded. Patient uses a cane around the house and a walker when she goes out. She does not have O2 at home but reports she had used it in [place] in the past. - METS 4.7  -Echo yesterday showed EF 55-60%, psuedonormalization, elevated left atrial and LVEDP, mildly dilated RA and LA, degenerative Mitral valve with mild MR, moderate TR, mod calcification of aortic valve.  - Given prior h/o of worsening symptoms/hypoxia during a myoview stress test I would not repeat it.   - Calculated Risk 10.1 % 30-day risk of death, MI, or cardiac arrest according to the Revised Cardiac Risk Index for Pre-Operative Risk - Patient is an intermediate risk for surgery.  - Plan for surgery this morning. Will follow up post-procedure to monitor volume status and breathing.   CAD s/p CABG 2011 - Patient had prior LAD stent occlusion, with progression of CAD to LM disease>>CABG in 2011 - Cath in 2015 showed severe underlying 3V CAD with patent SVG to OM2 and on 3; moderate mid RCA stenosis which was not grafted; patent LIMA to LAD, and SVG to diagonal; mildly reduced LV systolic function with evidence of severe MR; severely elevated  filling pressures with giant V waves noted on pressure racing. MR was thought to be ischemic.  - Home meds include Coreg 12.5 BID, Crestor 10 mg daily, and Plavix 75 mg daily - Not on aspirin at baseline. Plavix held for surgery. Would resume as soon as felt safe by primary teams.  - Patient denies any chest pain  Chronic diastolic Heart Failure Echo 07/2014 showed EF 50-55%, pseudonormal pattern of diastolic dysfunction with elevated filling pressures, no wall motion abnormalities, mild to moderate MR, mod TR, PA peak pressure 52 mm HG.  - Echo this admission showed  EF 55-60% with psuedonormalization.  - BNP on admission was 414 - Home meds include lasix 40 mg daily and coreg 12.5 mg BID - Patient receiving IVF for surgery. She has mild edema on exam. Monitor post-procedure.  PAD - Last seen by Dr. Kirke Corin in 2015 and noninvasive evaluation showed a normal ABI on the right side and 0.88 on the left. Duplex showed evidence of proximal left common femoral artery stenosis.  - Decided to pursue medical management since symptoms were not lifestyle limiting.  - Currently asymptomatic  Hyperlipidemia - Rosuvastatin 10 mg daily - LDL 31, HDL 28, TG 58  Mild to moderate Mitral valve regurgitation, ischemic - Found in 2015, thought to be ischemic due to occlusion of SVG-OM - On Ace inhibitor - Echo yesterday showed mild MR with degeneration  CKD - Baseline creatinine 1.2-1.5 - Stable  DM2 - Elevated on admission to 302 - A1C 7.1  Pulmonary HTN - Seen on echo in 2015 PA peak pressure 52 mm HG.  - Echo yesterday showed right ventricular systolic pressure is moderately elevated with an estimated pressure of 55.6 mmHg  For questions or updates, please contact [facility] Please consult [email or web address] for contact info under     Signed, Cadence David Stall, PA-C  06/26/2019, 8:15 AM     The patient was seen, examined and discussed with Cadence David Stall, PA-C   and I agree with the  above.   Given underlying CAD, chronic diastolic CHF the patient is considered an intermediate risk for a moderate risk surgery, however she has no signs of angina, decompensated CHF or severe valvular disease. There is no  absolute contraindication for this patient to proceed with surgery. We would recommend to continue using carvedilol in the perioperative period. Avoid excessive fluid resuscitation during surgery as her LVEDP is elevated. We will follow up in the postoperative period.   The patient asymptomatic from cardiac standpoint overnight, no signs of fluid overload, we will follow post op.   Tobias AlexanderKatarina Magenta Schmiesing, MD 06/26/2019   Addendum:  I was called that her surgery was canceled as she went to A. fib with RVR prior to the surgery.  EKG from 1209 today shows sinus tachycardia with frequent PACs.  She was brought back to the room and telemetry shows sinus tach around 100 with PACs.  I cannot prove that she truly was in atrial fibrillation however with the first episode that was shorter than 1 hour in the settings of acute sepsis and stress, and borderline anemia I would not recommend to start systemic anticoagulation.  Her carvedilol was held yesterday secondary to low blood pressure, I would restart at lower dose 6.25 mg p.o. twice daily.  There is currently no contraindication from cardiac standpoint to proceed with her surgery.  Family is determined to take her home to be able to feed her for at least few days to get stronger, I tried to explain that it is probably more beneficial to perform surgery rather sooner than later and feed her afterwards, however they seem to be already decided to take her home anyway.   Tobias AlexanderKatarina Shawntell Dixson, MD 06/26/2019

## 2019-06-26 NOTE — Progress Notes (Signed)
Patient ID: Margaret [name], female   DOB: Jul 22, 1934, 85 y.o.   MRN: [id number]  Cleared by Cardiology - intermediate risk Still having some RUQ pain.  Will plan lap chole with IOC today.  All questions answered through her daughter.  Imogene Burn. [name], MD, [facility]  General/ Trauma Surgery Beeper [phone]  06/26/2019 10:45 AM

## 2019-06-26 NOTE — Progress Notes (Signed)
ANTICOAGULATION CONSULT NOTE - Initial Consult  Pharmacy Consult for Heparin Indication: atrial fibrillation  Allergies  Allergen Reactions  . Myoview [Technetium-45m] Shortness Of Breath  . Penicillins Other (See Comments)    Unknown allergic reaction     Patient Measurements: Height: 5' (152.4 cm) Weight: 135 lb 12.9 oz (61.6 kg) IBW/kg (Calculated) : 45.5 Heparin Dosing Weight:  59 kg  Vital Signs: Temp: 99.2 F (37.3 C) (09/03 1319) Temp Source: Oral (09/03 1319) BP: 132/73 (09/03 1319) Pulse Rate: 81 (09/03 1319)  Labs: Recent Labs    06/24/19 0603 06/25/19 0248 06/26/19 0402  HGB 11.2* 9.0* 9.3*  HCT 35.5* 28.3* 30.0*  PLT 137* 109* 131*  CREATININE 1.33* 1.41* 1.19*    Estimated Creatinine Clearance: 29.3 mL/min (A) (by C-G formula based on SCr of 1.19 mg/dL (H)).   Medical History: Past Medical History:  Diagnosis Date  . Acute on chronic renal failure ([place]) 07/31/2014  . Acute pulmonary edema with congestive heart failure ([place]) 07/17/2014  . Acute renal insufficiency 07/19/2014  . Anemia   . ARF (acute renal failure) ([place]) 07/31/2014  . Atherosclerotic peripheral vascular disease with intermittent claudication ([place]) 04/2014   LEA Dopplers: RABI 1.02/ LABI: 0.61 (L SFA, Pop & DP blunted wave forms) -- correlates with L leg claudication  . Atrophic vaginitis 04/2013  . CAD (coronary artery disease)    a. prior PCI LAD 2005. b. CABG 2011.  Marland Kitchen Chronic diastolic CHF (congestive heart failure) ([place])   . CKD (chronic kidney disease), stage III ([place])   . Hx of CABG   . Hx of echocardiogram 05/2014   EF 50-55%, inf-lat HK, Gr 2 DD, very mild AS (mean 10 mmHg), mild MR, PASP 36 mmHg; calcified density in LA (suggest TEE)  . Hyperlipidemia with target LDL less than 70   . Mitral regurgitation   . PAD (peripheral artery disease) ([place])   . Pulmonary hypertension ([place])   . RESPIRATORY ARREST assoc w MYOVIEW   . Thyroid disease   . Tricuspid regurgitation   . Type  2 diabetes mellitus ([place])   . Vitamin B12 deficiency     Assessment: Anticoag: To OR and went into afib on the table. Hgb down to 9.3. Plts only 131  Goal of Therapy:  Heparin level 0.3-0.7 units/ml Monitor platelets by anticoagulation protocol: Yes   Plan:  Start IV heparin with 3500 unit bolus Heparin infusion at 850 units/hr Heparin level in 6-8 hrs after started Daily HL and CBC  Mikea Quadros S. Alford Highland, PharmD, [facility] Clinical Staff Pharmacist Eilene Ghazi Stillinger 06/26/2019,1:45 PM

## 2019-06-27 DIAGNOSIS — K810 Acute cholecystitis: Secondary | ICD-10-CM

## 2019-06-27 LAB — CBC
HCT: 32.6 % — ABNORMAL LOW (ref 36.0–46.0)
Hemoglobin: 10.1 g/dL — ABNORMAL LOW (ref 12.0–15.0)
MCH: 30.3 pg (ref 26.0–34.0)
MCHC: 31 g/dL (ref 30.0–36.0)
MCV: 97.9 fL (ref 80.0–100.0)
Platelets: 133 10*3/uL — ABNORMAL LOW (ref 150–400)
RBC: 3.33 MIL/uL — ABNORMAL LOW (ref 3.87–5.11)
RDW: 13.5 % (ref 11.5–15.5)
WBC: 12.1 10*3/uL — ABNORMAL HIGH (ref 4.0–10.5)
nRBC: 0 % (ref 0.0–0.2)

## 2019-06-27 LAB — COMPREHENSIVE METABOLIC PANEL
ALT: 11 U/L (ref 0–44)
AST: 13 U/L — ABNORMAL LOW (ref 15–41)
Albumin: 2.2 g/dL — ABNORMAL LOW (ref 3.5–5.0)
Alkaline Phosphatase: 47 U/L (ref 38–126)
Anion gap: 8 (ref 5–15)
BUN: 18 mg/dL (ref 8–23)
CO2: 21 mmol/L — ABNORMAL LOW (ref 22–32)
Calcium: 7.7 mg/dL — ABNORMAL LOW (ref 8.9–10.3)
Chloride: 109 mmol/L (ref 98–111)
Creatinine, Ser: 1.15 mg/dL — ABNORMAL HIGH (ref 0.44–1.00)
GFR calc Af Amer: 51 mL/min — ABNORMAL LOW (ref >60)
GFR calc non Af Amer: 44 mL/min — ABNORMAL LOW (ref >60)
Glucose, Bld: 145 mg/dL — ABNORMAL HIGH (ref 70–99)
Potassium: 3.7 mmol/L (ref 3.5–5.1)
Sodium: 138 mmol/L (ref 135–145)
Total Bilirubin: 0.5 mg/dL (ref 0.3–1.2)
Total Protein: 6 g/dL — ABNORMAL LOW (ref 6.5–8.1)

## 2019-06-27 LAB — GLUCOSE, CAPILLARY
Glucose-Capillary: 158 mg/dL — ABNORMAL HIGH (ref 70–99)
Glucose-Capillary: 142 mg/dL — ABNORMAL HIGH (ref 70–99)
Glucose-Capillary: 174 mg/dL — ABNORMAL HIGH (ref 70–99)
Glucose-Capillary: 153 mg/dL — ABNORMAL HIGH (ref 70–99)

## 2019-06-27 LAB — TROPONIN I (HIGH SENSITIVITY): Troponin I (High Sensitivity): 28 ng/L — ABNORMAL HIGH (ref <18)

## 2019-06-27 MED ORDER — SIMETHICONE 80 MG PO CHEW
80.0000 mg | CHEWABLE_TABLET | Freq: Once | ORAL | Status: AC
  Administered 2019-06-27: 80 mg via ORAL
  Filled 2019-06-27: qty 1

## 2019-06-27 NOTE — Progress Notes (Signed)
Progress Note  Patient Name: Margaret [name] Date of Encounter: 06/27/2019  Primary Cardiologist: Lance Muss, MD   Subjective   Patient is awaiting surgery. She denies chest pain or sob, she has RUQ pain related to her cholecystitis.  Inpatient Medications    Scheduled Meds: . amiodarone  150 mg Intravenous Once  . carvedilol  6.25 mg Oral BID WC  . Chlorhexidine Gluconate Cloth  6 each Topical Daily  . heparin  3,500 Units Intravenous Once  . insulin aspart  0-5 Units Subcutaneous QHS  . insulin aspart  0-9 Units Subcutaneous TID WC  . levothyroxine  125 mcg Oral QAC breakfast  . mupirocin ointment  1 application Nasal BID  . rosuvastatin  10 mg Oral QPC supper  . sodium chloride flush  3 mL Intravenous Once  . vitamin B-12  5,000 mcg Oral Daily   Continuous Infusions: . sodium chloride 75 mL/hr at 06/26/19 0600  . cefTRIAXone (ROCEPHIN)  IV 2 g (06/26/19 2205)  . lactated ringers 10 mL/hr at 06/26/19 1018  . metronidazole 500 mg (06/27/19 0353)   PRN Meds: acetaminophen **OR** acetaminophen, gabapentin, morphine injection, ondansetron (ZOFRAN) IV   Vital Signs    Vitals:   06/26/19 2235 06/27/19 0239 06/27/19 0636 06/27/19 0824  BP: 113/62 134/74 (!) 123/54 134/76  Pulse: 85 99 96 93  Resp: 16 16 15 16   Temp: 98.3 F (36.8 C) 98.5 F (36.9 C) 98.5 F (36.9 C) 98.1 F (36.7 C)  TempSrc: Oral Oral Oral Oral  SpO2: 99% 100% 97% 97%  Weight:      Height:        Intake/Output Summary (Last 24 hours) at 06/27/2019 1005 Last data filed at 06/27/2019 0852 Gross per 24 hour  Intake 953.75 ml  Output -  Net 953.75 ml   Last 3 Weights 06/26/2019 06/25/2019 06/24/2019  Weight (lbs) 135 lb 12.9 oz 135 lb 12.9 oz 126 lb 15.8 oz  Weight (kg) 61.6 kg 61.6 kg 57.6 kg     Telemetry    Not on telemetry  ECG     No new- Personally Reviewed  Physical Exam   GEN: No acute distress.   Neck: No JVD Cardiac: RRR, systolic murmurs, rubs, or gallops.  Respiratory: Clear  to auscultation bilaterally. GI: Soft, nontender, non-distended  MS: Mild edema; No deformity. Neuro:  Nonfocal  Psych: Normal affect   Labs    High Sensitivity Troponin:  No results for input(s): TROPONINIHS in the last 720 hours.    Chemistry Recent Labs  Lab 06/25/19 0248 06/26/19 0402 06/27/19 0341  NA 137 137 138  K 3.8 3.5 3.7  CL 103 107 109  CO2 22 19* 21*  GLUCOSE 139* 126* 145*  BUN 26* 22 18  CREATININE 1.41* 1.19* 1.15*  CALCIUM 7.9* 7.7* 7.7*  PROT 5.5* 5.7* 6.0*  ALBUMIN 2.2* 2.1* 2.2*  AST 13* 13* 13*  ALT 11 11 11   ALKPHOS 45 46 47  BILITOT 0.6 1.0 0.5  GFRNONAA 34* 42* 44*  GFRAA 40* 49* 51*  ANIONGAP 12 11 8      Hematology Recent Labs  Lab 06/25/19 0248 06/26/19 0402 06/27/19 0341  WBC 10.1 8.9 12.1*  RBC 2.93* 3.03* 3.33*  HGB 9.0* 9.3* 10.1*  HCT 28.3* 30.0* 32.6*  MCV 96.6 99.0 97.9  MCH 30.7 30.7 30.3  MCHC 31.8 31.0 31.0  RDW 13.6 13.4 13.5  PLT 109* 131* 133*    BNP Recent Labs  Lab 06/24/19 0603  BNP 414.2*  DDimer No results for input(s): DDIMER in the last 168 hours.   Radiology    No results found.  Cardiac Studies   Echo 06/25/19 IMPRESSIONS  1. The left ventricle has normal systolic function, with an ejection fraction of 55-60%. The cavity size was normal. Left ventricular diastolic Doppler parameters are consistent with pseudonormalization. Elevated left atrial and left ventricular  end-diastolic pressures.  2. The right ventricle has normal systolic function. The cavity was normal. There is no increase in right ventricular wall thickness. Right ventricular systolic pressure is moderately elevated with an estimated pressure of 55.6 mmHg.  3. Left atrial size was mildly dilated.  4. Right atrial size was mildly dilated.  5. The mitral valve is degenerative.  6. Tricuspid valve regurgitation is moderate.  7. The aortic valve is tricuspid. Moderate thickening of the aortic valve. Moderate calcification of the  aortic valve. Mild-moderate stenosis of the aortic valve.  8. The aorta is normal unless otherwise noted.  Patient Profile     85 y.o. female with a hx of pulmonary HTN, HTN, HLD, DM2, PVD, CAD (prior LAD stent occlusion, with progression of CAD to LM disease--CABG), CKD stage III, and Chronic diastolic heart failure, and hypothyroidism who is being seen today for the evaluation of pre-op clearance for possible cholecystectomy at the request of Dr. Avon Gully.   Assessment & Plan    Pre-op Clearance CAD s/p CABG 4098 Chronic diastolic Heart Failure CAD Hyperlipidemia Mild to moderate Mitral valve regurgitation, ischemic CKD DM2 Pulmonary HTN  Given underlying CAD, chronic diastolic CHF the patient is considered an intermediate risk for a moderate risk surgery, however she has no signs of angina, decompensated CHF or severe valvular disease. There is no absolute contraindication for this patient to proceed with surgery. We would recommend to continue using carvedilol in the perioperative period. Avoid excessive fluid resuscitation during surgery as her LVEDP is elevated.  Yesterday her surgery was cancelled for presumed a-fib, however there is no evidence for atrial fibrillation, ECG in Epic shows sinus tachycardia with PACs, she is currently in sinus tachycardia. Her pain is in RUQ and is related to her cholecystitis, there is no concern for an ACS. From cardiac standpoint there is no reason to continue postponing her surgery. Please continue her carvedilol. We will continue to follow in the perioperative period.     Ena Dawley, MD 06/27/2019

## 2019-06-27 NOTE — Consult Note (Signed)
Chief Complaint: Patient was seen in consultation today for acute cholecystitis  Referring Physician(s): Dr. Gershon Crane  Supervising Physician: Markus Daft  Patient Status: Margaret [name] - In-pt  History of Present Illness: Margaret [name] is a 85 y.o. female with past medical history of CKD, CAD s/p CABG, diastolic CHF, DM who presented to [name] ED with RUQ abdominal pain 06/23/19.  Patient was planning to undergo cholecystectomy after cardiac clearance obtained, however when she was taken to the OR she developed tachycardia- a fib vs. SVT.  Surgery was deferred and a percutaneous cholecystostomy tube was requested.  Patient and family with several questions as to best practice to manage her acute cholecystectomy which has delayed decision making, however they are now agreeable to pursuing cholecystomy tube placement.   Case reviewed and approved by Dr. Kathlene Cote.  PA met with patient and family at bedside. Patient currently stable.  Interpreter services did not have correct dialect.  Daughter present at bedside.   Past Medical History:  Diagnosis Date   Acute on chronic renal failure ([place]) 07/31/2014   Acute pulmonary edema with congestive heart failure ([place]) 07/17/2014   Acute renal insufficiency 07/19/2014   Anemia    ARF (acute renal failure) ([place]) 07/31/2014   Atherosclerotic peripheral vascular disease with intermittent claudication ([place]) 04/2014   LEA Dopplers: RABI 1.02/ LABI: 0.61 (L SFA, Pop & DP blunted wave forms) -- correlates with L leg claudication   Atrophic vaginitis 04/2013   CAD (coronary artery disease)    a. prior PCI LAD 2005. b. CABG 2011.   Chronic diastolic CHF (congestive heart failure) (HCC)    CKD (chronic kidney disease), stage III (HCC)    Hx of CABG    Hx of echocardiogram 05/2014   EF 50-55%, inf-lat HK, Gr 2 DD, very mild AS (mean 10 mmHg), mild MR, PASP 36 mmHg; calcified density in LA (suggest TEE)   Hyperlipidemia with target LDL less than 70    Mitral  regurgitation    PAD (peripheral artery disease) (HCC)    Pulmonary hypertension (HCC)    RESPIRATORY ARREST assoc w MYOVIEW    Thyroid disease    Tricuspid regurgitation    Type 2 diabetes mellitus ([place])    Vitamin B12 deficiency     Past Surgical History:  Procedure Laterality Date   CARDIAC CATHETERIZATION  2005   LAD stent occluded   CARDIAC CATHETERIZATION  03/2010    Ostial left main 60, OM1 70, circumflex 50-60, mid LAD stent occluded, mid RCA 30-40, RV marginal 90, EF 45-50%. >>> CABG   CORONARY ARTERY BYPASS GRAFT  03/2010   LIMA-LAD, SVG-D1, seqSVG-OM1-OM3   LEFT HEART CATHETERIZATION WITH CORONARY/GRAFT ANGIOGRAM N/A 07/22/2014   Procedure: LEFT HEART CATHETERIZATION WITH Beatrix Fetters;  Surgeon: Wellington Hampshire, MD;  Location: [facility];  Service: Cardiovascular;  Laterality: N/A;   TOTAL KNEE ARTHROPLASTY      Allergies: Myoview [technetium-73m and Penicillins  Medications: Prior to Admission medications   Medication Sig Start Date End Date Taking? Authorizing Provider  carvedilol (COREG) 12.5 MG tablet Take 1 tablet (12.5 mg total) by mouth 2 (two) times daily with a meal. Patient taking differently: Take 6.25 mg by mouth 2 (two) times daily with a meal.  08/11/14  Yes Arida, MMertie Clause MD  Cyanocobalamin (VITAMIN B-12) 2500 MCG TABS Take 5,000 mcg by mouth daily. 08/02/14  Yes Vann, Jessica U, DO  furosemide (LASIX) 40 MG tablet Take 40 mg by mouth daily as needed for fluid.  08/02/14  Yes Eulogio Bear U, DO  gabapentin (NEURONTIN) 100 MG capsule 100 mg, 3 times a day as needed for nerve pain Patient taking differently: Take 100 mg by mouth 3 (three) times daily as needed (nerve pain).  09/11/14  Yes Elayne Snare, MD  glipiZIDE-metformin (METAGLIP) 5-500 MG per tablet Take 1 tablet by mouth 2 (two) times daily before a meal.   Yes [provider]  levothyroxine (SYNTHROID) 100 MCG tablet Take 75-100 mcg by mouth See admin  instructions. Taking 1 tablet (146mg) Monday- Friday and 759m on Saturday, Sunday   Yes [provider]  linagliptin (TRADJENTA) 5 MG TABS tablet Take 5 mg by mouth every morning.   Yes [provider]  Multiple Vitamin (MULTIVITAMIN) tablet Take 1 tablet by mouth daily.   Yes [provider]  rosuvastatin (CRESTOR) 10 MG tablet Take 1 tablet (10 mg total) by mouth daily after supper. [place] taking differently: Take 10 mg by mouth daily after supper. ([place]/2/15  Yes Barrett, RhEvelene CroonPA-C  clopidogrel (PLAVIX) 75 MG tablet Take 75 mg by mouth daily.    [provider]  levothyroxine (SYNTHROID, LEVOTHROID) 125 MCG tablet Take 1 tablet (125 mcg total) by mouth daily before breakfast. 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   Food insecurity    Worry: Not on file    Inability: Not on fiLexicographereeds    Medical: Not on file    Non-medical: Not on file  Tobacco Use   Smoking status: Never Smoker   Smokeless tobacco: Never Used  Substance and Sexual Activity   Alcohol use: No   Drug use: No   Sexual activity: Not on file  Lifestyle   Physical activity    Days per week: Not on file    Minutes per session: Not on file   Stress: Not on file  Relationships   Social connections    Talks on phone: Not on file    Gets together: Not on file    Attends religious service: Not on  file    Active member of club or organization: Not on file    Attends meetings of clubs or organizations: Not on file    Relationship status: Not on file  Other Topics Concern   Not on file  Social History Narrative   Son is POA.   Does not smoke - never smoked.   Sp[place] in [place]    Review of Systems: A 12 point ROS discussed and pertinent positives are indicated in the HPI above.  All other systems are negative.  Review of Systems  Constitutional: Negative for fatigue and fever.  Respiratory: Negative for cough and shortness of breath.   Cardiovascular: Negative for chest pain.  Gastrointestinal: Positive  for abdominal pain and nausea. Negative for vomiting.  Genitourinary: Negative for dysuria.  Musculoskeletal: Negative for back pain.  Psychiatric/Behavioral: Negative for behavioral problems and confusion.    Vital Signs: BP (!) 154/65 (BP Location: Right Arm)    Pulse 88    Temp 98 F (36.7 C) (Oral)    Resp 20    Ht 5' (1.524 m)    Wt 135 lb 12.9 oz (61.6 kg)    SpO2 97%    BMI 26.52 kg/m   Physical Exam Vitals signs and nursing note reviewed.  Constitutional:      Appearance: She is well-developed.  HENT:     Mouth/Throat:     Mouth: Mucous membranes are moist.     Pharynx: Oropharynx is clear.  Cardiovascular:     Rate and Rhythm: Normal rate and regular rhythm.  Pulmonary:     Effort: Pulmonary effort is normal. No respiratory distress.     Breath sounds: Normal breath sounds.  Abdominal:     Tenderness: There is abdominal tenderness in the right upper quadrant.  Skin:    General: Skin is warm and dry.  Neurological:     General: No focal deficit present.     Mental Status: She is alert and oriented to person, place, and time.  Psychiatric:        Mood and Affect: Mood normal.        Behavior: Behavior normal.      MD Evaluation Airway: WNL Heart: WNL Abdomen: WNL Chest/ Lungs: WNL ASA  Classification:  3 Mallampati/Airway Score: One   Imaging: Nm Hepatobiliary Liver Func  Result Date: 06/24/2019 CLINICAL DATA:  Cholelithiasis EXAM: NUCLEAR MEDICINE HEPATOBILIARY IMAGING TECHNIQUE: Sequential images of the abdomen were obtained out to 60 minutes following intravenous administration of radiopharmaceutical. RADIOPHARMACEUTICALS:  5.07 mCi Tc-36m Choletec IV COMPARISON:  None FINDINGS: Normal tracer clearance from bloodstream indicating normal hepatocellular function. Prompt excretion of tracer into the biliary tree. Small bowel visualized at 15 minutes. At 55 minutes, gallbladder had not visualized. Patient's blood pressure was borderline low, precluding IV morphine administration. Delayed imaging was performed at 1611 hours. No definite delayed visualization of the gallbladder is seen. A tiny focus of tracer to the RIGHT lateral aspect of the CBD is felt to represent the duodenal bulb. IMPRESSION: Patent CBD. Nonvisualization of the gallbladder despite delayed imaging at nearly 4 hours, consistent with acute cholecystitis. Electronically Signed   By: MLavonia DanaM.D.   On: 06/24/2019 17:16   Dg Chest Port 1 View  Result Date: 06/23/2019 CLINICAL DATA:  Initial evaluation for acute fever, epigastric pain. EXAM: PORTABLE CHEST 1 VIEW COMPARISON:  Prior radiograph from 07/31/2014. FINDINGS: Median sternotomy wires underlying surgical clips and CABG markers noted. Cardiomegaly, stable. Mediastinal silhouette normal. Aortic atherosclerosis. Lungs mildly hypoinflated. Perihilar vascular congestion without overt pulmonary edema. No pleural effusion. No consolidative airspace opacity. No pneumothorax. Few scattered calcified granulomata noted. No acute osseous abnormality. Soft tissue calcifications overlie the left neck and chest. IMPRESSION: 1. Stable cardiomegaly with mild perihilar vascular congestion without overt pulmonary edema. 2. No other active cardiopulmonary disease. Electronically Signed   By:  BJeannine BogaM.D.   On: 06/23/2019 23:25   [place] Limited Ruq  Result Date: 06/24/2019 CLINICAL DATA:  Right upper quadrant pain. EXAM: ULTRASOUND ABDOMEN LIMITED RIGHT UPPER QUADRANT COMPARISON:  None. FINDINGS: Gallbladder: Distended with multiple gallstones. Borderline wall thickening of 3-4 mm. No pericholecystic fluid. No sonographic Murphy sign noted by sonographer. Common bile duct:  Diameter: 3-4 mm, normal. Liver: No focal lesion identified. Within normal limits in parenchymal echogenicity. Portal vein is patent on color Doppler imaging with normal direction of blood flow towards the liver. Other: None. IMPRESSION: 1. Gallstones with borderline wall thickening of 3-4 mm. Consider nuclear medicine HIDA scan if there is clinical concern for acute cholecystitis. No sonographic Murphy sign or pericholecystic fluid. 2. No biliary dilatation. Electronically Signed   By: Keith Rake M.D.   On: 06/24/2019 01:20    Labs:  CBC: Recent Labs    06/24/19 0603 06/25/19 0248 06/26/19 0402 06/27/19 0341  WBC 12.8* 10.1 8.9 12.1*  HGB 11.2* 9.0* 9.3* 10.1*  HCT 35.5* 28.3* 30.0* 32.6*  PLT 137* 109* 131* 133*    COAGS: No results for input(s): INR, APTT in the last 8760 hours.  BMP: Recent Labs    06/24/19 0603 06/25/19 0248 06/26/19 0402 06/27/19 0341  NA 133* 137 137 138  K 4.0 3.8 3.5 3.7  CL 99 103 107 109  CO2 22 22 19* 21*  GLUCOSE 184* 139* 126* 145*  BUN 25* 26* 22 18  CALCIUM 8.4* 7.9* 7.7* 7.7*  CREATININE 1.33* 1.41* 1.19* 1.15*  GFRNONAA 37* 34* 42* 44*  GFRAA 43* 40* 49* 51*    LIVER FUNCTION TESTS: Recent Labs    06/23/19 1728 06/25/19 0248 06/26/19 0402 06/27/19 0341  BILITOT 1.1 0.6 1.0 0.5  AST 19 13* 13* 13*  ALT _0 ALKPHOS 59 45 46 47  PROT 7.3 5.5* 5.7* 6.0*  ALBUMIN 3.2* 2.2* 2.1* 2.2*    TUMOR MARKERS: No results for input(s): AFPTM, CEA, CA199, CHROMGRNA in the last 8760 hours.  Assessment and Plan: Acute  cholecystitis  Patient planning for cholecystectomy, however with increased cardiac risk for surgery and new development of a fib vs. SVT.  Surgery deferred and cholecystostomy tube requested.  Family and patient now agreeable to proceed.  She is stable upon assessment today.  Afebrile, WBC 12.1 HR 78 BP 154/65  Check INR tomorrow AM.  NPO p MN  Attempted to complete visit with online interpreter services, however patient speaks a dialect not available and Hindi translator was not well-understood by patient.  Daughter acted as Optometrist per patient request.   All questions answered. Patient and family provide consent.   Risks and benefits discussed with the patient including, but not limited to bleeding, infection, gallbladder perforation, bile leak, sepsis or even death.  All of the patient's questions were answered, patient is agreeable to proceed. Consent signed and in chart.  Thank you for this interesting consult.  I greatly enjoyed meeting Margaret [name] and look forward to participating in their care.  A copy of this report was sent to the requesting provider on this date.  Electronically Signed: Docia Barrier, PA 06/27/2019, 5:24 PM   I spent a total of 40 Minutes    in face to face in clinical consultation, greater than 50% of which was counseling/coordinating care for acute cholecystitis.

## 2019-06-27 NOTE — Progress Notes (Signed)
PROGRESS NOTE  Margaret [name] YTK:[id number] DOB: 04-22-1934 DOA: 06/23/2019 PCP: Vernie Shanks, MD   LOS: 3 days   Brief Narrative / Interim history: 85 year old female with hyperlipidemia, diabetes mellitus, hypothyroidism, DVT, pulmonary hypertension, CAD, history of CABG, systolic CHF with EF of 55%, chronic kidney disease stage III who presented to the hospital and was admitted on 06/23/2019 due to right upper quadrant abdominal pain.  She was found to have acute cholecystitis.  Subjective: Complains of chest pain earlier to the surgical team but denies any complaints of pain to me.  States that she is feeling well  Assessment & Plan: Principal Problem:   Abdominal pain Active Problems:   Vitamin B12 deficiency   Chronic diastolic CHF (congestive heart failure), NYHA class 2 (HCC)   Hyperlipidemia with target LDL less than 70   CAD (coronary artery disease) of artery bypass graft   CKD (chronic kidney disease), stage III (HCC)   UTI (urinary tract infection)   Hyponatremia   Type II diabetes mellitus with renal manifestations ([place])   Sepsis ([place])   Hypothyroidism   Sinus tachycardia   Principal Problem Acute cholecystitis -Appreciate general surgery input, initial plans were in place for patient to undergo cholecystectomy on 9/3, however she developed tachycardia/?  A. fib and then her surgery was canceled and cardiology was consulted -HIDA scan was consistent with acute cholecystitis, right upper quadrant ultrasound showed gallstones with borderline wall thickening, no sonographic Murphy sign or pericholecystic fluid. -Currently on antibiotics, continue -May undergo percutaneous cholecystostomy per surgery, however family quite reticent about this.  They and the surgical team are discussing with family member who is a GI fellow and his attending, and it appears that she may be transferred to [facility]  Active Problems Sepsis due to UTI -Sepsis physiology resolved,  continue antibiotics.  Cultures without growth  ?  A. fib -Cardiology seen, however there is no evidence for A. fib and likely representing sinus tach with PACs.  She is cleared for surgery from cardiac standpoint.  Chronic combined CHF, history of CAD/CABG -2D echo 2011 showed an EF of 45%.  Repeat echo studies have showed the EF is now improved. -Appears euvolemic, continue Coreg, continue statin  Hypothyroidism -Continue Synthroid  Chronic kidney disease stage III -Unclear baseline, in 2015 creatinine was 2.0.  Currently better  Type 2 diabetes mellitus -A1c 7.1 which is at goal in her age group  Hyponatremia -Resolved.  Home Lasix is on hold  Scheduled Meds: . amiodarone  150 mg Intravenous Once  . carvedilol  6.25 mg Oral BID WC  . Chlorhexidine Gluconate Cloth  6 each Topical Daily  . insulin aspart  0-5 Units Subcutaneous QHS  . insulin aspart  0-9 Units Subcutaneous TID WC  . levothyroxine  125 mcg Oral QAC breakfast  . mupirocin ointment  1 application Nasal BID  . rosuvastatin  10 mg Oral QPC supper  . sodium chloride flush  3 mL Intravenous Once  . vitamin B-12  5,000 mcg Oral Daily   Continuous Infusions: . sodium chloride 75 mL/hr at 06/27/19 1144  . cefTRIAXone (ROCEPHIN)  IV 2 g (06/26/19 2205)  . lactated ringers 10 mL/hr at 06/26/19 1018  . metronidazole 500 mg (06/27/19 1035)   PRN Meds:.acetaminophen **OR** acetaminophen, gabapentin, morphine injection, ondansetron (ZOFRAN) IV  DVT prophylaxis: SCDs Code Status: Full code Family Communication: d/w patient  Disposition Plan: home when stable  Consultants:   Cardiology   General surgery   Procedures:   None  Antimicrobials:  None    Objective: Vitals:   06/27/19 0239 06/27/19 0636 06/27/19 0824 06/27/19 1044  BP: 134/74 (!) 123/54 134/76 138/61  Pulse: 99 96 93 89  Resp: 16 15 16 20   Temp: 98.5 F (36.9 C) 98.5 F (36.9 C) 98.1 F (36.7 C) 98.4 F (36.9 C)  TempSrc: Oral Oral  Oral Oral  SpO2: 100% 97% 97% 100%  Weight:      Height:        Intake/Output Summary (Last 24 hours) at 06/27/2019 1328 Last data filed at 06/27/2019 0852 Gross per 24 hour  Intake 653.75 ml  Output -  Net 653.75 ml   Filed Weights   06/24/19 0652 06/25/19 0408 06/26/19 1009  Weight: 57.6 kg 61.6 kg 61.6 kg    Examination:  Constitutional: NAD Eyes: PERRL, lids and conjunctivae normal ENMT: Mucous membranes are moist.  Respiratory: clear to auscultation bilaterally, no wheezing, no crackles. Normal respiratory effort.  Cardiovascular: Regular rate and rhythm, no murmurs / rubs / gallops. No LE edema Abdomen: no tenderness. Bowel sounds positive.  Musculoskeletal: no clubbing / cyanosis.  Skin: no rashes Neurologic: CN 2-12 grossly intact. Strength 5/5 in all 4.   Data Reviewed: I have independently reviewed following labs and imaging studies   CBC: Recent Labs  Lab 06/23/19 1728 06/24/19 0603 06/25/19 0248 06/26/19 0402 06/27/19 0341  WBC 15.7* 12.8* 10.1 8.9 12.1*  HGB 11.9* 11.2* 9.0* 9.3* 10.1*  HCT 37.3 35.5* 28.3* 30.0* 32.6*  MCV 94.9 95.2 96.6 99.0 97.9  PLT 162 137* 109* 131* 133*   Basic Metabolic Panel: Recent Labs  Lab 06/23/19 1728 06/24/19 0603 06/25/19 0248 06/26/19 0402 06/27/19 0341  NA 128* 133* 137 137 138  K 4.0 4.0 3.8 3.5 3.7  CL 92* 99 103 107 109  CO2 24 22 22  19* 21*  GLUCOSE 302* 184* 139* 126* 145*  BUN 22 25* 26* 22 18  CREATININE 1.36* 1.33* 1.41* 1.19* 1.15*  CALCIUM 8.9 8.4* 7.9* 7.7* 7.7*   GFR: Estimated Creatinine Clearance: 30.4 mL/min (A) (by C-G formula based on SCr of 1.15 mg/dL (H)). Liver Function Tests: Recent Labs  Lab 06/23/19 1728 06/25/19 0248 06/26/19 0402 06/27/19 0341  AST 19 13* 13* 13*  ALT 16 11 11 11   ALKPHOS 59 45 46 47  BILITOT 1.1 0.6 1.0 0.5  PROT 7.3 5.5* 5.7* 6.0*  ALBUMIN 3.2* 2.2* 2.1* 2.2*   Recent Labs  Lab 06/23/19 1728  LIPASE 25   No results for input(s): AMMONIA in the  last 168 hours. Coagulation Profile: No results for input(s): INR, PROTIME in the last 168 hours. Cardiac Enzymes: No results for input(s): CKTOTAL, CKMB, CKMBINDEX, TROPONINI in the last 168 hours. BNP (last 3 results) No results for input(s): PROBNP in the last 8760 hours. HbA1C: No results for input(s): HGBA1C in the last 72 hours. CBG: Recent Labs  Lab 06/26/19 0806 06/26/19 1825 06/26/19 2120 06/27/19 0822 06/27/19 1238  GLUCAP 107* 187* 153* 158* 142*   Lipid Profile: Recent Labs    06/26/19 0402  CHOL 71  HDL 28*  LDLCALC 31  TRIG 58  CHOLHDL 2.5   Thyroid Function Tests: No results for input(s): TSH, T4TOTAL, FREET4, T3FREE, THYROIDAB in the last 72 hours. Anemia Panel: No results for input(s): VITAMINB12, FOLATE, FERRITIN, TIBC, IRON, RETICCTPCT in the last 72 hours. Urine analysis:    Component Value Date/Time   COLORURINE AMBER (A) 06/23/2019 1941   APPEARANCEUR TURBID (A) 06/23/2019 1941   LABSPEC  1.017 06/23/2019 1941   PHURINE 5.0 06/23/2019 1941   GLUCOSEU 150 (A) 06/23/2019 1941   HGBUR SMALL (A) 06/23/2019 1941   BILIRUBINUR NEGATIVE 06/23/2019 1941   KETONESUR 5 (A) 06/23/2019 1941   PROTEINUR 100 (A) 06/23/2019 1941   UROBILINOGEN 0.2 07/31/2014 0235   NITRITE NEGATIVE 06/23/2019 1941   LEUKOCYTESUR LARGE (A) 06/23/2019 1941   Sepsis Labs: Invalid input(s): PROCALCITONIN, LACTICIDVEN  Recent Results (from the past 240 hour(s))  Blood culture (routine x 2)     Status: None (Preliminary result)   Collection Time: 06/23/19 11:35 PM   Specimen: BLOOD  Result Value Ref Range Status   Specimen Description BLOOD RIGHT ARM  Final   Special Requests   Final    BOTTLES DRAWN AEROBIC AND ANAEROBIC Blood Culture results may not be optimal due to an inadequate volume of blood received in culture bottles   Culture   Final    NO GROWTH 3 DAYS Performed at [facility], [address] [place], [place] [address]    Report Status PENDING   Incomplete  Blood culture (routine x 2)     Status: None (Preliminary result)   Collection Time: 06/23/19 11:40 PM   Specimen: BLOOD  Result Value Ref Range Status   Specimen Description BLOOD LEFT ARM  Final   Special Requests   Final    BOTTLES DRAWN AEROBIC AND ANAEROBIC Blood Culture adequate volume   Culture   Final    NO GROWTH 3 DAYS Performed at [facility], [address] [place], [place] [address]    Report Status PENDING  Incomplete  SARS Coronavirus 2 [facility](Hospital order, Performed in [facility] hospital lab) Nasopharyngeal Nasopharyngeal Swab     Status: None   Collection Time: 06/24/19  3:05 AM   Specimen: Nasopharyngeal Swab  Result Value Ref Range Status   SARS Coronavirus 2 NEGATIVE NEGATIVE Final    Comment: (NOTE) If result is NEGATIVE SARS-CoV-2 target nucleic acids are NOT DETECTED. The SARS-CoV-2 RNA is generally detectable in upper and lower  respiratory specimens during the acute phase of infection. The lowest  concentration of SARS-CoV-2 viral copies this assay can detect is 250  copies / mL. A negative result does not preclude SARS-CoV-2 infection  and should not be used as the sole basis for treatment or other  patient management decisions.  A negative result may occur with  improper specimen collection / handling, submission of specimen other  than nasopharyngeal swab, presence of viral mutation(s) within the  areas targeted by this assay, and inadequate number of viral copies  (<250 copies / mL). A negative result must be combined with clinical  observations, patient history, and epidemiological information. If result is POSITIVE SARS-CoV-2 target nucleic acids are DETECTED. The SARS-CoV-2 RNA is generally detectable in upper and lower  respiratory specimens dur ing the acute phase of infection.  Positive  results are indicative of active infection with SARS-CoV-2.  Clinical  correlation with patient history and other diagnostic information is   necessary to determine patient infection status.  Positive results do  not rule out bacterial infection or co-infection with other viruses. If result is PRESUMPTIVE POSTIVE SARS-CoV-2 nucleic acids MAY BE PRESENT.   A presumptive positive result was obtained on the submitted specimen  and confirmed on repeat testing.  While 2019 novel coronavirus  (SARS-CoV-2) nucleic acids may be present in the submitted sample  additional confirmatory testing may be necessary for epidemiological  and / or clinical  management purposes  to differentiate between  SARS-CoV-2 and other Sarbecovirus currently known to infect humans.  If clinically indicated additional testing with an alternate test  methodology [phone]) is advised. The SARS-CoV-2 RNA is generally  detectable in upper and lower respiratory sp ecimens during the acute  phase of infection. The expected result is Negative. Fact Sheet for Patients:  BoilerBrush.com.cyhttps://www.fda.gov/media/136312/download Fact Sheet for Healthcare Providers: https://pope.com/https://www.fda.gov/media/136313/download This test is not yet approved or cleared by the [place] FDA and has been authorized for detection and/or diagnosis of SARS-CoV-2 by FDA under an Emergency Use Authorization (EUA).  This EUA will remain in effect (meaning this test can be used) for the duration of the COVID-19 declaration under Section 564(b)(1) of the Act, 21 U.S.C. section 360bbb-3(b)(1), unless the authorization is terminated or revoked sooner. Performed at [facility], [address] [place], [place] [address]   Gastrointestinal Panel by PCR , Stool     Status: None   Collection Time: 06/24/19 11:52 PM   Specimen: STOOL  Result Value Ref Range Status   Campylobacter species NOT DETECTED NOT DETECTED Final   Plesimonas shigelloides NOT DETECTED NOT DETECTED Final   Salmonella species NOT DETECTED NOT DETECTED Final   Yersinia enterocolitica NOT DETECTED NOT DETECTED Final   Vibrio species  NOT DETECTED NOT DETECTED Final   Vibrio cholerae NOT DETECTED NOT DETECTED Final   Enteroaggregative E coli (EAEC) NOT DETECTED NOT DETECTED Final   Enteropathogenic E coli (EPEC) NOT DETECTED NOT DETECTED Final   Enterotoxigenic E coli (ETEC) NOT DETECTED NOT DETECTED Final   Shiga like toxin producing E coli (STEC) NOT DETECTED NOT DETECTED Final   Shigella/Enteroinvasive E coli (EIEC) NOT DETECTED NOT DETECTED Final   Cryptosporidium NOT DETECTED NOT DETECTED Final   Cyclospora cayetanensis NOT DETECTED NOT DETECTED Final   Entamoeba histolytica NOT DETECTED NOT DETECTED Final   Giardia lamblia NOT DETECTED NOT DETECTED Final   Adenovirus F40/41 NOT DETECTED NOT DETECTED Final   Astrovirus NOT DETECTED NOT DETECTED Final   Norovirus GI/GII NOT DETECTED NOT DETECTED Final   Rotavirus A NOT DETECTED NOT DETECTED Final   Sapovirus (I, II, IV, and V) NOT DETECTED NOT DETECTED Final    Comment: Performed at [facility], [address] [place], [place] [address]  C Difficile Quick Screen w PCR reflex     Status: None   Collection Time: 06/24/19 11:52 PM   Specimen: STOOL  Result Value Ref Range Status   C Diff antigen NEGATIVE NEGATIVE Final   C Diff toxin NEGATIVE NEGATIVE Final   C Diff interpretation No C. difficile detected.  Final    Comment: Performed at [facility], [address] [place], [place] [address]  Surgical pcr screen     Status: Abnormal   Collection Time: 06/25/19  4:16 AM   Specimen: Nasal Mucosa; Nasal Swab  Result Value Ref Range Status   MRSA, PCR NEGATIVE NEGATIVE Final   Staphylococcus aureus POSITIVE (A) NEGATIVE Final    Comment: (NOTE) The Xpert SA Assay (FDA approved for NASAL specimens in patients 83 years of age and older), is one component of a comprehensive surveillance program. It is not intended to diagnose infection nor to guide or monitor treatment. Performed at [facility], [address] [place], [place] [address]        Radiology Studies: No results found.   Margaret Pertostin Yaeli Hartung, MD, PhD [facility]  Contact via  [email or web address]  [facility] Office Info  P: [phone] [phone]

## 2019-06-27 NOTE — Progress Notes (Signed)
Pt and her daughter agreed to have perc cholecystostomy.

## 2019-06-27 NOTE — Progress Notes (Signed)
According to Saverio Danker pt has having chest pain and ordered troponin stat, checked the v/s stable, I asked her where is your pain RLQ abdominal pain and left chest and I  Asked her for pain scale she answered zero, its only muscle pain, offer pain med but refused, nurse tech was present at the bedside, alert and oriented, will continue to monitor, will held the surgery as of today.

## 2019-06-27 NOTE — Progress Notes (Signed)
This AC called to [employer] at pt's daughter request to speak to Hydrographic surveyor.  Pt's daughter Jarrett Soho would like to proceed with surgery and does not want to have drain placed. Per Jarrett Soho, Cardiology is not concerned with the cardiac event that stopped the initial surgery and she'd like to have the lap Chole instead of having drain placed.  Please contact Jarrett Soho to discuss plan.  AKingBSNRN- AC

## 2019-06-27 NOTE — Progress Notes (Addendum)
Patient ID: Margaret [name], female   DOB: July 31, 1934, 85 y.o.   MRN: [id number]    1 Day Post-Op  Subjective: Walked in this morning and patient is holding her left breast and complaining of left sided chest pain.  She has a heating pad on her left breast and states her pain is radiating up to her axilla.  She also complains of worsening pain in her RUQ in her abd.  She denies nausea.  ROS: + left-sided chest pain, no nausea, + abdominal pain, everything else currently negative  Objective: Vital signs in last 24 hours: Temp:  [97.7 F (36.5 C)-99.2 F (37.3 C)] 98.1 F (36.7 C) (09/04 0824) Pulse Rate:  [72-117] 93 (09/04 0824) Resp:  [14-22] 16 (09/04 0824) BP: (109-164)/(50-118) 134/76 (09/04 0824) SpO2:  [93 %-100 %] 97 % (09/04 0824) Weight:  [61.6 kg] 61.6 kg (09/03 1009) Last BM Date: 06/25/19  Intake/Output from previous day: 09/03 0701 - 09/04 0700 In: 953.8 [P.O.:180; I.V.:673.8; IV Piggyback:100] Out: -  Intake/Output this shift: No intake/output data recorded.  PE: Gen: NAD, seems a bit anxious Heart: regular with some skipped beats, EKG this morning shows sinus rhythm, septal infarct age undetermined.  Vitals are normal Lungs: CTAB Abd: soft, very tender in RUQ, +BS, ND Skin: warm, dry  Lab Results:  Recent Labs    06/26/19 0402 06/27/19 0341  WBC 8.9 12.1*  HGB 9.3* 10.1*  HCT 30.0* 32.6*  PLT 131* 133*   BMET Recent Labs    06/26/19 0402 06/27/19 0341  NA 137 138  K 3.5 3.7  CL 107 109  CO2 19* 21*  GLUCOSE 126* 145*  BUN 22 18  CREATININE 1.19* 1.15*  CALCIUM 7.7* 7.7*   PT/INR No results for input(s): LABPROT, INR in the last 72 hours. CMP     Component Value Date/Time   NA 138 06/27/2019 0341   K 3.7 06/27/2019 0341   CL 109 06/27/2019 0341   CO2 21 (L) 06/27/2019 0341   GLUCOSE 145 (H) 06/27/2019 0341   BUN 18 06/27/2019 0341   CREATININE 1.15 (H) 06/27/2019 0341   CALCIUM 7.7 (L) 06/27/2019 0341   PROT 6.0 (L) 06/27/2019 0341   ALBUMIN 2.2 (L) 06/27/2019 0341   AST 13 (L) 06/27/2019 0341   ALT 11 06/27/2019 0341   ALKPHOS 47 06/27/2019 0341   BILITOT 0.5 06/27/2019 0341   GFRNONAA 44 (L) 06/27/2019 0341   GFRAA 51 (L) 06/27/2019 0341   Lipase     Component Value Date/Time   LIPASE 25 06/23/2019 1728       Studies/Results: No results found.  Anti-infectives: Anti-infectives (From admission, onward)   Start     Dose/Rate Route Frequency Ordered Stop   06/26/19 2200  cefTRIAXone (ROCEPHIN) 2 g in sodium chloride 0.9 % 100 mL IVPB     2 g 200 mL/hr over 30 Minutes Intravenous Every 24 hours 06/26/19 1340     06/26/19 1400  metroNIDAZOLE (FLAGYL) IVPB 500 mg     500 mg 100 mL/hr over 60 Minutes Intravenous Every 8 hours 06/26/19 1340     06/24/19 2200  cefTRIAXone (ROCEPHIN) 2 g in sodium chloride 0.9 % 100 mL IVPB  Status:  Discontinued     2 g 200 mL/hr over 30 Minutes Intravenous Every 24 hours 06/24/19 0314 06/26/19 0954   06/24/19 1000  metroNIDAZOLE (FLAGYL) IVPB 500 mg  Status:  Discontinued     500 mg 100 mL/hr over 60 Minutes Intravenous Every 8 hours  06/24/19 0314 06/26/19 1340   06/24/19 0145  metroNIDAZOLE (FLAGYL) IVPB 500 mg     500 mg 100 mL/hr over 60 Minutes Intravenous  Once 06/24/19 0137 06/24/19 0609   06/24/19 0145  cefTRIAXone (ROCEPHIN) 2 g in sodium chloride 0.9 % 100 mL IVPB  Status:  Discontinued     2 g 200 mL/hr over 30 Minutes Intravenous  Once 06/24/19 0137 06/24/19 0137   06/24/19 0145  cefTRIAXone (ROCEPHIN) 1 g in sodium chloride 0.9 % 100 mL IVPB  Status:  Discontinued     1 g 200 mL/hr over 30 Minutes Intravenous  Once 06/24/19 0138 06/24/19 0534   06/23/19 2300  cefTRIAXone (ROCEPHIN) 1 g in sodium chloride 0.9 % 100 mL IVPB     1 g 200 mL/hr over 30 Minutes Intravenous  Once 06/23/19 2249 06/24/19 0244       Assessment/Plan DM Pulmonary HTN HTN CAD, s/p CABG, with  New left-sided chest pain this morning, troponin ordered CKD Stage III CHF UTI PAF -  back in sinus this am  Cholecystitis  -HIDA positive.  attempted surgery yesterday but prior to induction patient went into rapid afib with RVR in the 140s.  She has converted back to sinus on her own.  She was once again cleared by cardiology yesterday to proceed with surgery.  Upon my arrival this morning, the patient is holding her left breast and complaining of pain going to her left axilla along with RUQ abdominal pain.  I have ordered a stat troponin.  EKG this morning shows NSR with some PVCs and septal infarct, age undetermined.   -unclear if this chest pain is cardiac or not (could be anxiety or MSK as apparently she told the nurse), but given her intermediate cardiac risk, new a fib yesterday, and now this, we feel it is safest to likely proceed with perc chole drain to treat her acute gallbladder infection.  The gb infection is likely driving demand ischemia and her a fib incident yesterday etc.  However, induction of general anesthesia can worsen this and cause more problems.  We feel that draining the gallbladder with a perc chole drain and interval cholecystectomy is the SAFEST option for the patient at this point.  I have discussed this for over 30 minutes on the phone with the daughter and son.  Initially, they were ok with this and now they are unsure.  They are coming into the hospital to speak in person which I encouraged as this would be much more helpful.  FEN -NPO VTE -SCDs ID -Rocephin/Flagyl  I have spent 60 minutes with this patient and her family.   LOS: 3 days    Henreitta Cea , [facility] 06/27/2019, 8:30 AM Pager: [phone]

## 2019-06-27 NOTE — Plan of Care (Signed)
  Problem: Activity: Goal: Risk for activity intolerance will decrease Outcome: Progressing   Problem: Nutrition: Goal: Adequate nutrition will be maintained Outcome: Progressing   Problem: Coping: Goal: Level of anxiety will decrease Outcome: Progressing   

## 2019-06-28 ENCOUNTER — Inpatient Hospital Stay (HOSPITAL_COMMUNITY): Payer: Medicare Other

## 2019-06-28 ENCOUNTER — Encounter (HOSPITAL_COMMUNITY): Payer: Self-pay | Admitting: Diagnostic Radiology

## 2019-06-28 DIAGNOSIS — R1010 Upper abdominal pain, unspecified: Secondary | ICD-10-CM

## 2019-06-28 HISTORY — PX: IR PERC CHOLECYSTOSTOMY: IMG2326

## 2019-06-28 LAB — COMPREHENSIVE METABOLIC PANEL
ALT: 10 U/L (ref 0–44)
AST: 13 U/L — ABNORMAL LOW (ref 15–41)
Albumin: 2.1 g/dL — ABNORMAL LOW (ref 3.5–5.0)
Alkaline Phosphatase: 56 U/L (ref 38–126)
Anion gap: 13 (ref 5–15)
BUN: 18 mg/dL (ref 8–23)
CO2: 18 mmol/L — ABNORMAL LOW (ref 22–32)
Calcium: 7.4 mg/dL — ABNORMAL LOW (ref 8.9–10.3)
Chloride: 107 mmol/L (ref 98–111)
Creatinine, Ser: 1.14 mg/dL — ABNORMAL HIGH (ref 0.44–1.00)
GFR calc Af Amer: 51 mL/min — ABNORMAL LOW (ref >60)
GFR calc non Af Amer: 44 mL/min — ABNORMAL LOW (ref >60)
Glucose, Bld: 191 mg/dL — ABNORMAL HIGH (ref 70–99)
Potassium: 3.5 mmol/L (ref 3.5–5.1)
Sodium: 138 mmol/L (ref 135–145)
Total Bilirubin: 0.6 mg/dL (ref 0.3–1.2)
Total Protein: 5.6 g/dL — ABNORMAL LOW (ref 6.5–8.1)

## 2019-06-28 LAB — CBC
HCT: 28.1 % — ABNORMAL LOW (ref 36.0–46.0)
Hemoglobin: 8.8 g/dL — ABNORMAL LOW (ref 12.0–15.0)
MCH: 30.7 pg (ref 26.0–34.0)
MCHC: 31.3 g/dL (ref 30.0–36.0)
MCV: 97.9 fL (ref 80.0–100.0)
Platelets: 119 10*3/uL — ABNORMAL LOW (ref 150–400)
RBC: 2.87 MIL/uL — ABNORMAL LOW (ref 3.87–5.11)
RDW: 13.8 % (ref 11.5–15.5)
WBC: 11.4 10*3/uL — ABNORMAL HIGH (ref 4.0–10.5)
nRBC: 0 % (ref 0.0–0.2)

## 2019-06-28 LAB — GLUCOSE, CAPILLARY
Glucose-Capillary: 189 mg/dL — ABNORMAL HIGH (ref 70–99)
Glucose-Capillary: 149 mg/dL — ABNORMAL HIGH (ref 70–99)
Glucose-Capillary: 152 mg/dL — ABNORMAL HIGH (ref 70–99)
Glucose-Capillary: 173 mg/dL — ABNORMAL HIGH (ref 70–99)

## 2019-06-28 LAB — PROTIME-INR
INR: 1.4 — ABNORMAL HIGH (ref 0.8–1.2)
Prothrombin Time: 17 seconds — ABNORMAL HIGH (ref 11.4–15.2)

## 2019-06-28 MED ORDER — FENTANYL CITRATE (PF) 100 MCG/2ML IJ SOLN
INTRAMUSCULAR | Status: AC | PRN
  Administered 2019-06-28: 25 ug via INTRAVENOUS

## 2019-06-28 MED ORDER — CALCIUM CARBONATE ANTACID 500 MG PO CHEW
200.0000 mg | CHEWABLE_TABLET | ORAL | Status: DC | PRN
  Administered 2019-06-28 (×2): 200 mg via ORAL
  Filled 2019-06-28 (×2): qty 1

## 2019-06-28 MED ORDER — MIDAZOLAM HCL 2 MG/2ML IJ SOLN
INTRAMUSCULAR | Status: AC | PRN
  Administered 2019-06-28: 0.5 mg via INTRAVENOUS

## 2019-06-28 MED ORDER — IOHEXOL 300 MG/ML  SOLN
50.0000 mL | Freq: Once | INTRAMUSCULAR | Status: AC | PRN
  Administered 2019-06-28: 10 mL

## 2019-06-28 MED ORDER — LIDOCAINE HCL 1 % IJ SOLN
INTRAMUSCULAR | Status: AC | PRN
  Administered 2019-06-28: 10 mL

## 2019-06-28 MED ORDER — LIDOCAINE HCL 1 % IJ SOLN
INTRAMUSCULAR | Status: AC
  Filled 2019-06-28: qty 20

## 2019-06-28 MED ORDER — FENTANYL CITRATE (PF) 100 MCG/2ML IJ SOLN
INTRAMUSCULAR | Status: AC
  Filled 2019-06-28: qty 2

## 2019-06-28 MED ORDER — MIDAZOLAM HCL 2 MG/2ML IJ SOLN
INTRAMUSCULAR | Status: AC
  Filled 2019-06-28: qty 2

## 2019-06-28 NOTE — Progress Notes (Signed)
Patient ID: Margaret [name], female   DOB: 04-03-1934, 85 y.o.   MRN: [id number]    2 Days Post-Op  Subjective: Pt denies pain this AM.  Spoke with her family on the phone.  Dr. Corliss Skains spent quite a bit of time on phone with family yesterday to discuss how the timing has increased risk of open cholecystectomy and intraoperative injuries.     Objective: Vital signs in last 24 hours: Temp:  [97.5 F (36.4 C)-98.4 F (36.9 C)] 97.6 F (36.4 C) (09/05 0519) Pulse Rate:  [62-93] 87 (09/05 0519) Resp:  [16-20] 17 (09/05 0519) BP: (113-154)/(53-100) 113/53 (09/05 0519) SpO2:  [97 %-100 %] 100 % (09/05 0519) Last BM Date: 06/27/19  Intake/Output from previous day: 09/04 0701 - 09/05 0700 In: 100 [P.O.:100] Out: -  Intake/Output this shift: No intake/output data recorded.  PE: Gen: NAD Heart: regular  Lungs: CTAB Abd: soft, ND, NT Skin: warm, dry  Lab Results:  Recent Labs    06/27/19 0341 06/28/19 0405  WBC 12.1* 11.4*  HGB 10.1* 8.8*  HCT 32.6* 28.1*  PLT 133* 119*   BMET Recent Labs    06/27/19 0341 06/28/19 0405  NA 138 138  K 3.7 3.5  CL 109 107  CO2 21* 18*  GLUCOSE 145* 191*  BUN 18 18  CREATININE 1.15* 1.14*  CALCIUM 7.7* 7.4*   PT/INR Recent Labs    06/28/19 0405  LABPROT 17.0*  INR 1.4*   CMP     Component Value Date/Time   NA 138 06/28/2019 0405   K 3.5 06/28/2019 0405   CL 107 06/28/2019 0405   CO2 18 (L) 06/28/2019 0405   GLUCOSE 191 (H) 06/28/2019 0405   BUN 18 06/28/2019 0405   CREATININE 1.14 (H) 06/28/2019 0405   CALCIUM 7.4 (L) 06/28/2019 0405   PROT 5.6 (L) 06/28/2019 0405   ALBUMIN 2.1 (L) 06/28/2019 0405   AST 13 (L) 06/28/2019 0405   ALT 10 06/28/2019 0405   ALKPHOS 56 06/28/2019 0405   BILITOT 0.6 06/28/2019 0405   GFRNONAA 44 (L) 06/28/2019 0405   GFRAA 51 (L) 06/28/2019 0405   Lipase     Component Value Date/Time   LIPASE 25 06/23/2019 1728       Studies/Results: No results found.  Anti-infectives:  Anti-infectives (From admission, onward)   Start     Dose/Rate Route Frequency Ordered Stop   06/26/19 2200  cefTRIAXone (ROCEPHIN) 2 g in sodium chloride 0.9 % 100 mL IVPB     2 g 200 mL/hr over 30 Minutes Intravenous Every 24 hours 06/26/19 1340     06/26/19 1400  metroNIDAZOLE (FLAGYL) IVPB 500 mg     500 mg 100 mL/hr over 60 Minutes Intravenous Every 8 hours 06/26/19 1340     06/24/19 2200  cefTRIAXone (ROCEPHIN) 2 g in sodium chloride 0.9 % 100 mL IVPB  Status:  Discontinued     2 g 200 mL/hr over 30 Minutes Intravenous Every 24 hours 06/24/19 0314 06/26/19 0954   06/24/19 1000  metroNIDAZOLE (FLAGYL) IVPB 500 mg  Status:  Discontinued     500 mg 100 mL/hr over 60 Minutes Intravenous Every 8 hours 06/24/19 0314 06/26/19 1340   06/24/19 0145  metroNIDAZOLE (FLAGYL) IVPB 500 mg     500 mg 100 mL/hr over 60 Minutes Intravenous  Once 06/24/19 0137 06/24/19 0609   06/24/19 0145  cefTRIAXone (ROCEPHIN) 2 g in sodium chloride 0.9 % 100 mL IVPB  Status:  Discontinued  2 g 200 mL/hr over 30 Minutes Intravenous  Once 06/24/19 0137 06/24/19 0137   06/24/19 0145  cefTRIAXone (ROCEPHIN) 1 g in sodium chloride 0.9 % 100 mL IVPB  Status:  Discontinued     1 g 200 mL/hr over 30 Minutes Intravenous  Once 06/24/19 0138 06/24/19 0534   06/23/19 2300  cefTRIAXone (ROCEPHIN) 1 g in sodium chloride 0.9 % 100 mL IVPB     1 g 200 mL/hr over 30 Minutes Intravenous  Once 06/23/19 2249 06/24/19 0244       Assessment/Plan DM Pulmonary HTN HTN CAD, s/p CABG CKD Stage III CHF UTI PAF - back in sinus this am  Cholecystitis  Perc chole tube planned.  IR has seen.  This is planned today.    FEN -NPO VTE -SCDs ID -Rocephin/Flagyl   LOS: 4 days   Milus Height, MD FACS Surgical Oncology, General Surgery, Trauma and [place] [facility], [place] [phone] Check [email or web address], password [facility] for coverage night/weekend

## 2019-06-28 NOTE — Procedures (Signed)
Interventional Radiology Procedure:   Indications: Cholecystitis  Procedure: Image guided cholecystostomy tube placement  Findings: 15 ml of brown purulent fluid removed from gallbladder.  Gallbladder decompressed with 10 Fr drain.    Complications: None     EBL: less than 10 ml  Plan: Follow output, send fluid for culture.     Marquis Diles R. Anselm Pancoast, MD  Pager: [phone]

## 2019-06-28 NOTE — Progress Notes (Signed)
PROGRESS NOTE  Florestine AversSudha Templeton ZOX:[id number] DOB: 07/14/1934 DOA: 06/23/2019 PCP: Ileana LaddWong, Francis P, MD   LOS: 4 days   Brief Narrative / Interim history: 85 year old female with hyperlipidemia, diabetes mellitus, hypothyroidism, DVT, pulmonary hypertension, CAD, history of CABG, systolic CHF with EF of 45%, chronic kidney disease stage III who presented to the hospital and was admitted on 06/23/2019 due to right upper quadrant abdominal pain.  She was found to have acute cholecystitis.  Subjective: No chest pain, no shortness of breath.  No abdominal pain this morning  Assessment & Plan: Principal Problem:   Abdominal pain Active Problems:   Vitamin B12 deficiency   Chronic diastolic CHF (congestive heart failure), NYHA class 2 (HCC)   Hyperlipidemia with target LDL less than 70   CAD (coronary artery disease) of artery bypass graft   CKD (chronic kidney disease), stage III (HCC)   UTI (urinary tract infection)   Hyponatremia   Type II diabetes mellitus with renal manifestations (HCC)   Sepsis (HCC)   Hypothyroidism   Sinus tachycardia   Principal Problem Acute cholecystitis -Appreciate general surgery input, initial plans were in place for patient to undergo cholecystectomy on 9/3, however she developed tachycardia/?  A. fib and then her surgery was canceled and cardiology was consulted -HIDA scan was consistent with acute cholecystitis, right upper quadrant ultrasound showed gallstones with borderline wall thickening, no sonographic Murphy sign or pericholecystic fluid. -Currently on antibiotics, continue -After significant discussions between surgical team, family, decision was made to proceed with cholecystostomy and delayed cholecystectomy in a few weeks.  IR consulted  Active Problems Sepsis due to UTI -Sepsis physiology resolved, continue antibiotics.  Cultures without growth  ?  A. fib -Cardiology seen, however there is no evidence for A. fib and likely representing sinus  tach with PACs.  She is cleared for surgery from cardiac standpoint. -Discussed with Dr. Eden EmmsNishan this morning   Chronic combined CHF, history of CAD/CABG -2D echo 2011 showed an EF of 45%.  Repeat echo studies have showed the EF is now improved. -Appears euvolemic, continue Coreg, continue statin  Hypothyroidism -Continue Synthroid  Chronic kidney disease stage III -Unclear baseline, in 2015 creatinine was 2.0.  Currently better, 1.14 this morning  Type 2 diabetes mellitus -A1c 7.1 which is at goal in her age group  Hyponatremia -Resolved.  Home Lasix is on hold  Scheduled Meds: . amiodarone  150 mg Intravenous Once  . carvedilol  6.25 mg Oral BID WC  . Chlorhexidine Gluconate Cloth  6 each Topical Daily  . insulin aspart  0-5 Units Subcutaneous QHS  . insulin aspart  0-9 Units Subcutaneous TID WC  . levothyroxine  125 mcg Oral QAC breakfast  . lidocaine      . mupirocin ointment  1 application Nasal BID  . rosuvastatin  10 mg Oral QPC supper  . sodium chloride flush  3 mL Intravenous Once  . vitamin B-12  5,000 mcg Oral Daily   Continuous Infusions: . sodium chloride 75 mL/hr at 06/27/19 1144  . cefTRIAXone (ROCEPHIN)  IV Stopped (06/27/19 2236)  . lactated ringers 10 mL/hr at 06/26/19 1018  . metronidazole 500 mg (06/28/19 0253)   PRN Meds:.acetaminophen **OR** acetaminophen, gabapentin, morphine injection, ondansetron (ZOFRAN) IV  DVT prophylaxis: SCDs Code Status: Full code Family Communication: d/w patient  Disposition Plan: home when stable  Consultants:   Cardiology   General surgery   Procedures:   None   Antimicrobials:  None    Objective: Vitals:   06/27/19  1044 06/27/19 1624 06/27/19 2018 06/28/19 0519  BP: 138/61 (!) 154/65 (!) 118/100 (!) 113/53  Pulse: 89 88 62 87  Resp: 20 20 18 17   Temp: 98.4 F (36.9 C) 98 F (36.7 C) (!) 97.5 F (36.4 C) 97.6 F (36.4 C)  TempSrc: Oral Oral Oral Oral  SpO2: 100% 97% 100% 100%  Weight:       Height:        Intake/Output Summary (Last 24 hours) at 06/28/2019 1043 Last data filed at 06/27/2019 1600 Gross per 24 hour  Intake 100 ml  Output -  Net 100 ml   Filed Weights   06/24/19 0652 06/25/19 0408 06/26/19 1009  Weight: 57.6 kg 61.6 kg 61.6 kg    Examination:  Constitutional: No distress, laying in bed Eyes: No scleral icterus ENMT: mmm Respiratory: Clear bilaterally without wheezing or crackles Cardiovascular: Regular rate and rhythm, no murmurs.  No edema Abdomen: Soft, nontender, nondistended, active bowel Musculoskeletal: no clubbing / cyanosis.  Skin: No rash seen Neurologic: Nonfocal, equal strength  Data Reviewed: I have independently reviewed following labs and imaging studies   CBC: Recent Labs  Lab 06/24/19 0603 06/25/19 0248 06/26/19 0402 06/27/19 0341 06/28/19 0405  WBC 12.8* 10.1 8.9 12.1* 11.4*  HGB 11.2* 9.0* 9.3* 10.1* 8.8*  HCT 35.5* 28.3* 30.0* 32.6* 28.1*  MCV 95.2 96.6 99.0 97.9 97.9  PLT 137* 109* 131* 133* 147*   Basic Metabolic Panel: Recent Labs  Lab 06/24/19 0603 06/25/19 0248 06/26/19 0402 06/27/19 0341 06/28/19 0405  NA 133* 137 137 138 138  K 4.0 3.8 3.5 3.7 3.5  CL 99 103 107 109 107  CO2 22 22 19* 21* 18*  GLUCOSE 184* 139* 126* 145* 191*  BUN 25* 26* 22 18 18   CREATININE 1.33* 1.41* 1.19* 1.15* 1.14*  CALCIUM 8.4* 7.9* 7.7* 7.7* 7.4*   GFR: Estimated Creatinine Clearance: 30.6 mL/min (A) (by C-G formula based on SCr of 1.14 mg/dL (H)). Liver Function Tests: Recent Labs  Lab 06/23/19 1728 06/25/19 0248 06/26/19 0402 06/27/19 0341 06/28/19 0405  AST 19 13* 13* 13* 13*  ALT 16 11 11 11 10   ALKPHOS 59 45 46 47 56  BILITOT 1.1 0.6 1.0 0.5 0.6  PROT 7.3 5.5* 5.7* 6.0* 5.6*  ALBUMIN 3.2* 2.2* 2.1* 2.2* 2.1*   Recent Labs  Lab 06/23/19 1728  LIPASE 25   No results for input(s): AMMONIA in the last 168 hours. Coagulation Profile: Recent Labs  Lab 06/28/19 0405  INR 1.4*   Cardiac Enzymes: No  results for input(s): CKTOTAL, CKMB, CKMBINDEX, TROPONINI in the last 168 hours. BNP (last 3 results) No results for input(s): PROBNP in the last 8760 hours. HbA1C: No results for input(s): HGBA1C in the last 72 hours. CBG: Recent Labs  Lab 06/27/19 0822 06/27/19 1238 06/27/19 1708 06/27/19 2058 06/28/19 0820  GLUCAP 158* 142* 174* 153* 189*   Lipid Profile: Recent Labs    06/26/19 0402  CHOL 71  HDL 28*  LDLCALC 31  TRIG 58  CHOLHDL 2.5   Thyroid Function Tests: No results for input(s): TSH, T4TOTAL, FREET4, T3FREE, THYROIDAB in the last 72 hours. Anemia Panel: No results for input(s): VITAMINB12, FOLATE, FERRITIN, TIBC, IRON, RETICCTPCT in the last 72 hours. Urine analysis:    Component Value Date/Time   COLORURINE AMBER (A) 06/23/2019 1941   APPEARANCEUR TURBID (A) 06/23/2019 1941   LABSPEC 1.017 06/23/2019 1941   PHURINE 5.0 06/23/2019 1941   GLUCOSEU 150 (A) 06/23/2019 1941   HGBUR  SMALL (A) 06/23/2019 1941   BILIRUBINUR NEGATIVE 06/23/2019 1941   KETONESUR 5 (A) 06/23/2019 1941   PROTEINUR 100 (A) 06/23/2019 1941   UROBILINOGEN 0.2 07/31/2014 0235   NITRITE NEGATIVE 06/23/2019 1941   LEUKOCYTESUR LARGE (A) 06/23/2019 1941   Sepsis Labs: Invalid input(s): PROCALCITONIN, LACTICIDVEN  Recent Results (from the past 240 hour(s))  Blood culture (routine x 2)     Status: None (Preliminary result)   Collection Time: 06/23/19 11:35 PM   Specimen: BLOOD  Result Value Ref Range Status   Specimen Description BLOOD RIGHT ARM  Final   Special Requests   Final    BOTTLES DRAWN AEROBIC AND ANAEROBIC Blood Culture results may not be optimal due to an inadequate volume of blood received in culture bottles   Culture   Final    NO GROWTH 3 DAYS Performed at [facility], [address] [place], [place] [address]    Report Status PENDING  Incomplete  Blood culture (routine x 2)     Status: None (Preliminary result)   Collection Time: 06/23/19 11:40 PM    Specimen: BLOOD  Result Value Ref Range Status   Specimen Description BLOOD LEFT ARM  Final   Special Requests   Final    BOTTLES DRAWN AEROBIC AND ANAEROBIC Blood Culture adequate volume   Culture   Final    NO GROWTH 3 DAYS Performed at [facility], [address] [place], [place] [address]    Report Status PENDING  Incomplete  SARS Coronavirus 2 [facility](Hospital order, Performed in [facility] hospital lab) Nasopharyngeal Nasopharyngeal Swab     Status: None   Collection Time: 06/24/19  3:05 AM   Specimen: Nasopharyngeal Swab  Result Value Ref Range Status   SARS Coronavirus 2 NEGATIVE NEGATIVE Final    Comment: (NOTE) If result is NEGATIVE SARS-CoV-2 target nucleic acids are NOT DETECTED. The SARS-CoV-2 RNA is generally detectable in upper and lower  respiratory specimens during the acute phase of infection. The lowest  concentration of SARS-CoV-2 viral copies this assay can detect is 250  copies / mL. A negative result does not preclude SARS-CoV-2 infection  and should not be used as the sole basis for treatment or other  patient management decisions.  A negative result may occur with  improper specimen collection / handling, submission of specimen other  than nasopharyngeal swab, presence of viral mutation(s) within the  areas targeted by this assay, and inadequate number of viral copies  (<250 copies / mL). A negative result must be combined with clinical  observations, patient history, and epidemiological information. If result is POSITIVE SARS-CoV-2 target nucleic acids are DETECTED. The SARS-CoV-2 RNA is generally detectable in upper and lower  respiratory specimens dur ing the acute phase of infection.  Positive  results are indicative of active infection with SARS-CoV-2.  Clinical  correlation with patient history and other diagnostic information is  necessary to determine patient infection status.  Positive results do  not rule out bacterial infection or  co-infection with other viruses. If result is PRESUMPTIVE POSTIVE SARS-CoV-2 nucleic acids MAY BE PRESENT.   A presumptive positive result was obtained on the submitted specimen  and confirmed on repeat testing.  While 2019 novel coronavirus  (SARS-CoV-2) nucleic acids may be present in the submitted sample  additional confirmatory testing may be necessary for epidemiological  and / or clinical management purposes  to differentiate between  SARS-CoV-2 and other Sarbecovirus currently known to infect humans.  If clinically  indicated additional testing with an alternate test  methodology [phone]) is advised. The SARS-CoV-2 RNA is generally  detectable in upper and lower respiratory sp ecimens during the acute  phase of infection. The expected result is Negative. Fact Sheet for Patients:  BoilerBrush.com.cy Fact Sheet for Healthcare Providers: https://pope.com/ This test is not yet approved or cleared by the [place] FDA and has been authorized for detection and/or diagnosis of SARS-CoV-2 by FDA under an Emergency Use Authorization (EUA).  This EUA will remain in effect (meaning this test can be used) for the duration of the COVID-19 declaration under Section 564(b)(1) of the Act, 21 U.S.C. section 360bbb-3(b)(1), unless the authorization is terminated or revoked sooner. Performed at [facility], [address] [place], [place] [address]   Gastrointestinal Panel by PCR , Stool     Status: None   Collection Time: 06/24/19 11:52 PM   Specimen: STOOL  Result Value Ref Range Status   Campylobacter species NOT DETECTED NOT DETECTED Final   Plesimonas shigelloides NOT DETECTED NOT DETECTED Final   Salmonella species NOT DETECTED NOT DETECTED Final   Yersinia enterocolitica NOT DETECTED NOT DETECTED Final   Vibrio species NOT DETECTED NOT DETECTED Final   Vibrio cholerae NOT DETECTED NOT DETECTED Final   Enteroaggregative E coli  (EAEC) NOT DETECTED NOT DETECTED Final   Enteropathogenic E coli (EPEC) NOT DETECTED NOT DETECTED Final   Enterotoxigenic E coli (ETEC) NOT DETECTED NOT DETECTED Final   Shiga like toxin producing E coli (STEC) NOT DETECTED NOT DETECTED Final   Shigella/Enteroinvasive E coli (EIEC) NOT DETECTED NOT DETECTED Final   Cryptosporidium NOT DETECTED NOT DETECTED Final   Cyclospora cayetanensis NOT DETECTED NOT DETECTED Final   Entamoeba histolytica NOT DETECTED NOT DETECTED Final   Giardia lamblia NOT DETECTED NOT DETECTED Final   Adenovirus F40/41 NOT DETECTED NOT DETECTED Final   Astrovirus NOT DETECTED NOT DETECTED Final   Norovirus GI/GII NOT DETECTED NOT DETECTED Final   Rotavirus A NOT DETECTED NOT DETECTED Final   Sapovirus (I, II, IV, and V) NOT DETECTED NOT DETECTED Final    Comment: Performed at [facility], [address] [place], [place] [address]  C Difficile Quick Screen w PCR reflex     Status: None   Collection Time: 06/24/19 11:52 PM   Specimen: STOOL  Result Value Ref Range Status   C Diff antigen NEGATIVE NEGATIVE Final   C Diff toxin NEGATIVE NEGATIVE Final   C Diff interpretation No C. difficile detected.  Final    Comment: Performed at [facility], [address] [place], [place] [address]  Surgical pcr screen     Status: Abnormal   Collection Time: 06/25/19  4:16 AM   Specimen: Nasal Mucosa; Nasal Swab  Result Value Ref Range Status   MRSA, PCR NEGATIVE NEGATIVE Final   Staphylococcus aureus POSITIVE (A) NEGATIVE Final    Comment: (NOTE) The Xpert SA Assay (FDA approved for NASAL specimens in patients 14 years of age and older), is one component of a comprehensive surveillance program. It is not intended to diagnose infection nor to guide or monitor treatment. Performed at [facility], [address] [place], [place] [address]       Radiology Studies: No results found.   Pamella Pert, MD, PhD [facility]  Contact via   [email or web address]  [facility] Office Info P: [phone] F: [phone]

## 2019-06-28 NOTE — Progress Notes (Signed)
Progress Note  Patient Name: Margaret [name] Date of Encounter: 06/28/2019  Primary Cardiologist: Lance Muss, MD   Subjective   No cardiac complaints   Inpatient Medications    Scheduled Meds: . amiodarone  150 mg Intravenous Once  . carvedilol  6.25 mg Oral BID WC  . Chlorhexidine Gluconate Cloth  6 each Topical Daily  . insulin aspart  0-5 Units Subcutaneous QHS  . insulin aspart  0-9 Units Subcutaneous TID WC  . levothyroxine  125 mcg Oral QAC breakfast  . mupirocin ointment  1 application Nasal BID  . rosuvastatin  10 mg Oral QPC supper  . sodium chloride flush  3 mL Intravenous Once  . vitamin B-12  5,000 mcg Oral Daily   Continuous Infusions: . sodium chloride 75 mL/hr at 06/27/19 1144  . cefTRIAXone (ROCEPHIN)  IV Stopped (06/27/19 2236)  . lactated ringers 10 mL/hr at 06/26/19 1018  . metronidazole 500 mg (06/28/19 0253)   PRN Meds: acetaminophen **OR** acetaminophen, gabapentin, morphine injection, ondansetron (ZOFRAN) IV   Vital Signs    Vitals:   06/27/19 1044 06/27/19 1624 06/27/19 2018 06/28/19 0519  BP: 138/61 (!) 154/65 (!) 118/100 (!) 113/53  Pulse: 89 88 62 87  Resp: 20 20 18 17   Temp: 98.4 F (36.9 C) 98 F (36.7 C) (!) 97.5 F (36.4 C) 97.6 F (36.4 C)  TempSrc: Oral Oral Oral Oral  SpO2: 100% 97% 100% 100%  Weight:      Height:        Intake/Output Summary (Last 24 hours) at 06/28/2019 0717 Last data filed at 06/27/2019 1600 Gross per 24 hour  Intake 100 ml  Output -  Net 100 ml   Last 3 Weights 06/26/2019 06/25/2019 06/24/2019  Weight (lbs) 135 lb 12.9 oz 135 lb 12.9 oz 126 lb 15.8 oz  Weight (kg) 61.6 kg 61.6 kg 57.6 kg     Telemetry    Not on telemetry  ECG     No new- Personally Reviewed  Physical Exam   Affect appropriate Elderly indian female  HEENT: normal Neck supple with no adenopathy JVP normal no bruits no thyromegaly Lungs clear with no wheezing and good diaphragmatic motion Heart:  S1/S2 no murmur, no rub,  gallop or click PMI normal Abdomen: RUQ pain  Distal pulses intact with no bruits No edema Neuro non-focal Skin warm and dry No muscular weakness  Labs    High Sensitivity Troponin:   Recent Labs  Lab 06/27/19 0918  TROPONINIHS 28*      Chemistry Recent Labs  Lab 06/26/19 0402 06/27/19 0341 06/28/19 0405  NA 137 138 138  K 3.5 3.7 3.5  CL 107 109 107  CO2 19* 21* 18*  GLUCOSE 126* 145* 191*  BUN 22 18 18   CREATININE 1.19* 1.15* 1.14*  CALCIUM 7.7* 7.7* 7.4*  PROT 5.7* 6.0* 5.6*  ALBUMIN 2.1* 2.2* 2.1*  AST 13* 13* 13*  ALT 11 11 10   ALKPHOS 46 47 56  BILITOT 1.0 0.5 0.6  GFRNONAA 42* 44* 44*  GFRAA 49* 51* 51*  ANIONGAP 11 8 13      Hematology Recent Labs  Lab 06/26/19 0402 06/27/19 0341 06/28/19 0405  WBC 8.9 12.1* 11.4*  RBC 3.03* 3.33* 2.87*  HGB 9.3* 10.1* 8.8*  HCT 30.0* 32.6* 28.1*  MCV 99.0 97.9 97.9  MCH 30.7 30.3 30.7  MCHC 31.0 31.0 31.3  RDW 13.4 13.5 13.8  PLT 131* 133* 119*    BNP Recent Labs  Lab 06/24/19 0603  BNP 414.2*     DDimer No results for input(s): DDIMER in the last 168 hours.   Radiology    No results found.  Cardiac Studies   Echo 06/25/19 IMPRESSIONS  1. The left ventricle has normal systolic function, with an ejection fraction of 55-60%. The cavity size was normal. Left ventricular diastolic Doppler parameters are consistent with pseudonormalization. Elevated left atrial and left ventricular  end-diastolic pressures.  2. The right ventricle has normal systolic function. The cavity was normal. There is no increase in right ventricular wall thickness. Right ventricular systolic pressure is moderately elevated with an estimated pressure of 55.6 mmHg.  3. Left atrial size was mildly dilated.  4. Right atrial size was mildly dilated.  5. The mitral valve is degenerative.  6. Tricuspid valve regurgitation is moderate.  7. The aortic valve is tricuspid. Moderate thickening of the aortic valve. Moderate calcification  of the aortic valve. Mild-moderate stenosis of the aortic valve.  8. The aorta is normal unless otherwise noted.  Patient Profile     85 y.o. female with a hx of pulmonary HTN, HTN, HLD, DM2, PVD, CAD (prior LAD stent occlusion, with progression of CAD to LM disease--CABG), CKD stage III, and Chronic diastolic heart failure, and hypothyroidism who is being seen today for the evaluation of pre-op clearance for possible cholecystectomy at the request of Dr. Avon Gully.   Assessment & Plan    Pre-op Clearance CAD s/p CABG 8315 Chronic diastolic Heart Failure CAD Hyperlipidemia Mild to moderate Mitral valve regurgitation, ischemic CKD DM2 Pulmonary HTN  Stable no angina RUQ pain from cholecystitis  No afib. Rhythm sinus rates 80's this am [facility] for surgery from cardiology perspective Apparently per primary service plan is for cholecystostomy tube instead     Jenkins Rouge, MD 06/28/2019

## 2019-06-29 LAB — CBC
HCT: 30.7 % — ABNORMAL LOW (ref 36.0–46.0)
Hemoglobin: 9.4 g/dL — ABNORMAL LOW (ref 12.0–15.0)
MCH: 29.7 pg (ref 26.0–34.0)
MCHC: 30.6 g/dL (ref 30.0–36.0)
MCV: 97.2 fL (ref 80.0–100.0)
Platelets: 121 10*3/uL — ABNORMAL LOW (ref 150–400)
RBC: 3.16 MIL/uL — ABNORMAL LOW (ref 3.87–5.11)
RDW: 14.1 % (ref 11.5–15.5)
WBC: 10.3 10*3/uL (ref 4.0–10.5)
nRBC: 0 % (ref 0.0–0.2)

## 2019-06-29 LAB — COMPREHENSIVE METABOLIC PANEL
ALT: 9 U/L (ref 0–44)
AST: 16 U/L (ref 15–41)
Albumin: 2.1 g/dL — ABNORMAL LOW (ref 3.5–5.0)
Alkaline Phosphatase: 49 U/L (ref 38–126)
Anion gap: 12 (ref 5–15)
BUN: 21 mg/dL (ref 8–23)
CO2: 18 mmol/L — ABNORMAL LOW (ref 22–32)
Calcium: 7.4 mg/dL — ABNORMAL LOW (ref 8.9–10.3)
Chloride: 109 mmol/L (ref 98–111)
Creatinine, Ser: 1.24 mg/dL — ABNORMAL HIGH (ref 0.44–1.00)
GFR calc Af Amer: 47 mL/min — ABNORMAL LOW (ref >60)
GFR calc non Af Amer: 40 mL/min — ABNORMAL LOW (ref >60)
Glucose, Bld: 169 mg/dL — ABNORMAL HIGH (ref 70–99)
Potassium: 3.5 mmol/L (ref 3.5–5.1)
Sodium: 139 mmol/L (ref 135–145)
Total Bilirubin: 0.5 mg/dL (ref 0.3–1.2)
Total Protein: 5.4 g/dL — ABNORMAL LOW (ref 6.5–8.1)

## 2019-06-29 LAB — CULTURE, BLOOD (ROUTINE X 2)
Culture: NO GROWTH
Culture: NO GROWTH
Special Requests: ADEQUATE

## 2019-06-29 LAB — GLUCOSE, CAPILLARY
Glucose-Capillary: 168 mg/dL — ABNORMAL HIGH (ref 70–99)
Glucose-Capillary: 236 mg/dL — ABNORMAL HIGH (ref 70–99)
Glucose-Capillary: 171 mg/dL — ABNORMAL HIGH (ref 70–99)
Glucose-Capillary: 317 mg/dL — ABNORMAL HIGH (ref 70–99)

## 2019-06-29 MED ORDER — METRONIDAZOLE 500 MG PO TABS
500.0000 mg | ORAL_TABLET | Freq: Three times a day (TID) | ORAL | Status: DC
  Administered 2019-06-29 – 2019-06-30 (×4): 500 mg via ORAL
  Filled 2019-06-29 (×4): qty 1

## 2019-06-29 MED ORDER — SODIUM CHLORIDE 0.9% FLUSH
5.0000 mL | Freq: Three times a day (TID) | INTRAVENOUS | Status: DC
  Administered 2019-06-29 – 2019-06-30 (×2): 5 mL

## 2019-06-29 NOTE — Progress Notes (Signed)
PROGRESS NOTE  Margaret [name] HXT:[id number] DOB: Jun 30, 1934 DOA: 06/23/2019 PCP: Ileana Ladd, MD   LOS: 5 days   Brief Narrative / Interim history: 85 year old female with hyperlipidemia, diabetes mellitus, hypothyroidism, DVT, pulmonary hypertension, CAD, history of CABG, systolic CHF with EF of 45%, chronic kidney disease stage III who presented to the hospital and was admitted on 06/23/2019 due to right upper quadrant abdominal pain.  She was found to have acute cholecystitis.  Subjective: Feels well, denies any significant pain.  No nausea or vomiting.  Has not had any solid food yet postprocedural  Assessment & Plan: Principal Problem:   Abdominal pain Active Problems:   Vitamin B12 deficiency   Chronic diastolic CHF (congestive heart failure), NYHA class 2 (HCC)   Hyperlipidemia with target LDL less than 70   CAD (coronary artery disease) of artery bypass graft   CKD (chronic kidney disease), stage III (HCC)   UTI (urinary tract infection)   Hyponatremia   Type II diabetes mellitus with renal manifestations (HCC)   Sepsis (HCC)   Hypothyroidism   Sinus tachycardia   Principal Problem Acute cholecystitis -Appreciate general surgery input, initial plans were in place for patient to undergo cholecystectomy on 9/3, however she developed tachycardia/?  A. fib and then her surgery was canceled and cardiology was consulted -HIDA scan was consistent with acute cholecystitis, right upper quadrant ultrasound showed gallstones with borderline wall thickening, no sonographic Murphy sign or pericholecystic fluid. -Currently on antibiotics, continue -After significant discussions between surgical team, family, decision was made to proceed with cholecystostomy and delayed cholecystectomy in a few weeks.  IR consulted, patient is status post percutaneous cholecystostomy on 9/5.  Advance diet today, cultures sent from IR and pending  Active Problems Sepsis due to UTI -Sepsis physiology  resolved, continue antibiotics.  -Cultures show few gram-negative rods and few gram-positive rods.  Continue current antibiotics, tailor based on results  ?  A. fib -Cardiology seen, however there is no evidence for A. fib and likely representing sinus tach with PACs.  She is cleared for surgery from cardiac standpoint. -Stable, cardiology signed off  Chronic combined CHF, history of CAD/CABG -2D echo 2011 showed an EF of 45%.  Repeat echo studies have showed the EF is now improved. -Appears euvolemic, continue Coreg, continue statin  Hypothyroidism -Continue Synthroid  Chronic kidney disease stage III -Unclear baseline, in 2015 creatinine was 2.0.  Overall stable  Type 2 diabetes mellitus -A1c 7.1 which is at goal in her age group  Hyponatremia -Resolved.  Home Lasix is on hold  Scheduled Meds:  amiodarone  150 mg Intravenous Once   carvedilol  6.25 mg Oral BID WC   Chlorhexidine Gluconate Cloth  6 each Topical Daily   insulin aspart  0-5 Units Subcutaneous QHS   insulin aspart  0-9 Units Subcutaneous TID WC   levothyroxine  125 mcg Oral QAC breakfast   metroNIDAZOLE  500 mg Oral Q8H   mupirocin ointment  1 application Nasal BID   rosuvastatin  10 mg Oral QPC supper   sodium chloride flush  3 mL Intravenous Once   vitamin B-12  5,000 mcg Oral Daily   Continuous Infusions:  sodium chloride Stopped (06/28/19 1207)   cefTRIAXone (ROCEPHIN)  IV Stopped (06/28/19 2230)   lactated ringers 10 mL/hr at 06/26/19 1018   PRN Meds:.acetaminophen **OR** acetaminophen, calcium carbonate, gabapentin, morphine injection, ondansetron (ZOFRAN) IV  DVT prophylaxis: SCDs Code Status: Full code Family Communication: d/w patient and daughter at the bedside Disposition Plan:  home when stable  Consultants:   Cardiology   General surgery   Procedures:   None   Antimicrobials:  None    Objective: Vitals:   06/28/19 1137 06/28/19 1437 06/28/19 2135 06/29/19 0515    BP: (!) 141/61 (!) 118/43 (!) 97/47 (!) 119/53  Pulse: 82 87 86 85  Resp: 18 17 20    Temp:  98.7 F (37.1 C) 98.4 F (36.9 C) 97.8 F (36.6 C)  TempSrc:  Oral Oral Oral  SpO2: 100% 100% 99% 100%  Weight:      Height:        Intake/Output Summary (Last 24 hours) at 06/29/2019 1102 Last data filed at 06/29/2019 0846 Gross per 24 hour  Intake 2670.04 ml  Output 100 ml  Net 2570.04 ml   Filed Weights   06/24/19 0652 06/25/19 0408 06/26/19 1009  Weight: 57.6 kg 61.6 kg 61.6 kg    Examination:  Constitutional: No distress, appearing comfortable Eyes: No scleral icterus ENMT: Moist mucous membranes Respiratory: Clear to auscultation, no wheezing, no crackles Cardiovascular: Regular rate and rhythm, no murmurs.  No peripheral edema Abdomen: Soft, NT, ND, bowel sounds positive Musculoskeletal: no clubbing / cyanosis.  Skin: No rashes seen Neurologic: No focal deficits  Data Reviewed: I have independently reviewed following labs and imaging studies   CBC: Recent Labs  Lab 06/25/19 0248 06/26/19 0402 06/27/19 0341 06/28/19 0405 06/29/19 0346  WBC 10.1 8.9 12.1* 11.4* 10.3  HGB 9.0* 9.3* 10.1* 8.8* 9.4*  HCT 28.3* 30.0* 32.6* 28.1* 30.7*  MCV 96.6 99.0 97.9 97.9 97.2  PLT 109* 131* 133* 119* 121*   Basic Metabolic Panel: Recent Labs  Lab 06/25/19 0248 06/26/19 0402 06/27/19 0341 06/28/19 0405 06/29/19 0346  NA 137 137 138 138 139  K 3.8 3.5 3.7 3.5 3.5  CL 103 107 109 107 109  CO2 22 19* 21* 18* 18*  GLUCOSE 139* 126* 145* 191* 169*  BUN 26* 22 18 18 21   CREATININE 1.41* 1.19* 1.15* 1.14* 1.24*  CALCIUM 7.9* 7.7* 7.7* 7.4* 7.4*   GFR: Estimated Creatinine Clearance: 28.2 mL/min (A) (by C-G formula based on SCr of 1.24 mg/dL (H)). Liver Function Tests: Recent Labs  Lab 06/25/19 0248 06/26/19 0402 06/27/19 0341 06/28/19 0405 06/29/19 0346  AST 13* 13* 13* 13* 16  ALT 11 11 11 10 9   ALKPHOS 45 46 47 56 49  BILITOT 0.6 1.0 0.5 0.6 0.5  PROT 5.5* 5.7*  6.0* 5.6* 5.4*  ALBUMIN 2.2* 2.1* 2.2* 2.1* 2.1*   Recent Labs  Lab 06/23/19 1728  LIPASE 25   No results for input(s): AMMONIA in the last 168 hours. Coagulation Profile: Recent Labs  Lab 06/28/19 0405  INR 1.4*   Cardiac Enzymes: No results for input(s): CKTOTAL, CKMB, CKMBINDEX, TROPONINI in the last 168 hours. BNP (last 3 results) No results for input(s): PROBNP in the last 8760 hours. HbA1C: No results for input(s): HGBA1C in the last 72 hours. CBG: Recent Labs  Lab 06/28/19 0820 06/28/19 1237 06/28/19 1653 06/28/19 2131 06/29/19 0805  GLUCAP 189* 149* 152* 173* 168*   Lipid Profile: No results for input(s): CHOL, HDL, LDLCALC, TRIG, CHOLHDL, LDLDIRECT in the last 72 hours. Thyroid Function Tests: No results for input(s): TSH, T4TOTAL, FREET4, T3FREE, THYROIDAB in the last 72 hours. Anemia Panel: No results for input(s): VITAMINB12, FOLATE, FERRITIN, TIBC, IRON, RETICCTPCT in the last 72 hours. Urine analysis:    Component Value Date/Time   COLORURINE AMBER (A) 06/23/2019 1941   APPEARANCEUR TURBID (  A) 06/23/2019 1941   LABSPEC 1.017 06/23/2019 1941   PHURINE 5.0 06/23/2019 1941   GLUCOSEU 150 (A) 06/23/2019 1941   HGBUR SMALL (A) 06/23/2019 1941   BILIRUBINUR NEGATIVE 06/23/2019 1941   KETONESUR 5 (A) 06/23/2019 1941   PROTEINUR 100 (A) 06/23/2019 1941   UROBILINOGEN 0.2 07/31/2014 0235   NITRITE NEGATIVE 06/23/2019 1941   LEUKOCYTESUR LARGE (A) 06/23/2019 1941   Sepsis Labs: Invalid input(s): PROCALCITONIN, LACTICIDVEN  Recent Results (from the past 240 hour(s))  Blood culture (routine x 2)     Status: None   Collection Time: 06/23/19 11:35 PM   Specimen: BLOOD  Result Value Ref Range Status   Specimen Description BLOOD RIGHT ARM  Final   Special Requests   Final    BOTTLES DRAWN AEROBIC AND ANAEROBIC Blood Culture results may not be optimal due to an inadequate volume of blood received in culture bottles   Culture   Final    NO GROWTH 5  DAYS Performed at [facility], [place] [address] [place], [place] [address]    Report Status 06/29/2019 FINAL  Final  Blood culture (routine x 2)     Status: None   Collection Time: 06/23/19 11:40 PM   Specimen: BLOOD  Result Value Ref Range Status   Specimen Description BLOOD LEFT ARM  Final   Special Requests   Final    BOTTLES DRAWN AEROBIC AND ANAEROBIC Blood Culture adequate volume   Culture   Final    NO GROWTH 5 DAYS Performed at [facility], [place] [address] [place], [place] [address]    Report Status 06/29/2019 FINAL  Final  SARS Coronavirus 2 [facility] order, Performed in [facility] hospital lab) Nasopharyngeal Nasopharyngeal Swab     Status: None   Collection Time: 06/24/19  3:05 AM   Specimen: Nasopharyngeal Swab  Result Value Ref Range Status   SARS Coronavirus 2 NEGATIVE NEGATIVE Final    Comment: (NOTE) If result is NEGATIVE SARS-CoV-2 target nucleic acids are NOT DETECTED. The SARS-CoV-2 RNA is generally detectable in upper and lower  respiratory specimens during the acute phase of infection. The lowest  concentration of SARS-CoV-2 viral copies this assay can detect is 250  copies / mL. A negative result does not preclude SARS-CoV-2 infection  and should not be used as the sole basis for treatment or other  patient management decisions.  A negative result may occur with  improper specimen collection / handling, submission of specimen other  than nasopharyngeal swab, presence of viral mutation(s) within the  areas targeted by this assay, and inadequate number of viral copies  (<250 copies / mL). A negative result must be combined with clinical  observations, patient history, and epidemiological information. If result is POSITIVE SARS-CoV-2 target nucleic acids are DETECTED. The SARS-CoV-2 RNA is generally detectable in upper and lower  respiratory specimens dur ing the acute phase of infection.  Positive  results are indicative of active infection  with SARS-CoV-2.  Clinical  correlation with patient history and other diagnostic information is  necessary to determine patient infection status.  Positive results do  not rule out bacterial infection or co-infection with other viruses. If result is PRESUMPTIVE POSTIVE SARS-CoV-2 nucleic acids MAY BE PRESENT.   A presumptive positive result was obtained on the submitted specimen  and confirmed on repeat testing.  While 2019 novel coronavirus  (SARS-CoV-2) nucleic acids may be present in the submitted sample  additional confirmatory testing may be necessary for epidemiological  and / or clinical management purposes  to differentiate between  SARS-CoV-2 and other Sarbecovirus currently known to infect humans.  If clinically indicated additional testing with an alternate test  methodology [phone] is advised. The SARS-CoV-2 RNA is generally  detectable in upper and lower respiratory sp ecimens during the acute  phase of infection. The expected result is Negative. Fact Sheet for Patients:  BoilerBrush.com.cyhttps://www.fda.gov/media/136312/download Fact Sheet for Healthcare Providers: https://pope.com/https://www.fda.gov/media/136313/download This test is not yet approved or cleared by the [place] FDA and has been authorized for detection and/or diagnosis of SARS-CoV-2 by FDA under an Emergency Use Authorization (EUA).  This EUA will remain in effect (meaning this test can be used) for the duration of the COVID-19 declaration under Section 564(b)(1) of the Act, 21 U.S.C. section 360bbb-3(b)(1), unless the authorization is terminated or revoked sooner. Performed at [facility], [address] [place], [place] [address]   Gastrointestinal Panel by PCR , Stool     Status: None   Collection Time: 06/24/19 11:52 PM   Specimen: STOOL  Result Value Ref Range Status   Campylobacter species NOT DETECTED NOT DETECTED Final   Plesimonas shigelloides NOT DETECTED NOT DETECTED Final   Salmonella species NOT  DETECTED NOT DETECTED Final   Yersinia enterocolitica NOT DETECTED NOT DETECTED Final   Vibrio species NOT DETECTED NOT DETECTED Final   Vibrio cholerae NOT DETECTED NOT DETECTED Final   Enteroaggregative E coli (EAEC) NOT DETECTED NOT DETECTED Final   Enteropathogenic E coli (EPEC) NOT DETECTED NOT DETECTED Final   Enterotoxigenic E coli (ETEC) NOT DETECTED NOT DETECTED Final   Shiga like toxin producing E coli (STEC) NOT DETECTED NOT DETECTED Final   Shigella/Enteroinvasive E coli (EIEC) NOT DETECTED NOT DETECTED Final   Cryptosporidium NOT DETECTED NOT DETECTED Final   Cyclospora cayetanensis NOT DETECTED NOT DETECTED Final   Entamoeba histolytica NOT DETECTED NOT DETECTED Final   Giardia lamblia NOT DETECTED NOT DETECTED Final   Adenovirus F40/41 NOT DETECTED NOT DETECTED Final   Astrovirus NOT DETECTED NOT DETECTED Final   Norovirus GI/GII NOT DETECTED NOT DETECTED Final   Rotavirus A NOT DETECTED NOT DETECTED Final   Sapovirus (I, II, IV, and V) NOT DETECTED NOT DETECTED Final    Comment: Performed at [facility], [address] [place], [place] [address]  C Difficile Quick Screen w PCR reflex     Status: None   Collection Time: 06/24/19 11:52 PM   Specimen: STOOL  Result Value Ref Range Status   C Diff antigen NEGATIVE NEGATIVE Final   C Diff toxin NEGATIVE NEGATIVE Final   C Diff interpretation No C. difficile detected.  Final    Comment: Performed at [facility], [address] [place], [place] [address]  Surgical pcr screen     Status: Abnormal   Collection Time: 06/25/19  4:16 AM   Specimen: Nasal Mucosa; Nasal Swab  Result Value Ref Range Status   MRSA, PCR NEGATIVE NEGATIVE Final   Staphylococcus aureus POSITIVE (A) NEGATIVE Final    Comment: (NOTE) The Xpert SA Assay (FDA approved for NASAL specimens in patients 83 years of age and older), is one component of a comprehensive surveillance program. It is not intended to diagnose infection nor  to guide or monitor treatment. Performed at [facility], [address] [place], [place] [address]   Aerobic/Anaerobic Culture (surgical/deep wound)     Status: None (Preliminary result)   Collection Time: 06/28/19 11:43 AM   Specimen:  Abscess; Bile  Result Value Ref Range Status   Specimen Description ABSCESS GALL BLADDER  Final   Special Requests NONE  Final   Gram Stain   Final    ABUNDANT WBC PRESENT, PREDOMINANTLY PMN FEW GRAM NEGATIVE RODS RARE GRAM POSITIVE RODS Performed at [facility], [address] [place], [place] [address]    Culture PENDING  Incomplete   Report Status PENDING  Incomplete      Radiology Studies: Ir Perc Cholecystostomy  Result Date: 06/28/2019 INDICATION: 85 year old with acute cholecystitis and not a good surgical candidate at this time. Plan for percutaneous cholecystostomy tube placement. EXAM: PERCUTANEOUS CHOLECYSTOSTOMY TUBE PLACEMENT WITH ULTRASOUND AND FLUOROSCOPIC GUIDANCE MEDICATIONS: Inpatient receiving antibiotics. ANESTHESIA/SEDATION: Moderate (conscious) sedation was employed during this procedure. A total of Versed 0.5 mg and Fentanyl 25 mcg was administered intravenously. Moderate Sedation Time: 11 minutes. The patient's level of consciousness and vital signs were monitored continuously by radiology nursing throughout the procedure under my direct supervision. FLUOROSCOPY TIME:  Fluoroscopy Time: 1 minutes 48 seconds, 5 mGy COMPLICATIONS: None immediate. PROCEDURE: Informed written consent was obtained from the patient and daughter after a thorough discussion of the procedural risks, benefits and alternatives. All questions were addressed. Maximal Sterile Barrier Technique was utilized including caps, mask, sterile gowns, sterile gloves, sterile drape, hand hygiene and skin antiseptic. A timeout was performed prior to the initiation of the procedure. Right side of the abdomen was evaluated with ultrasound. The right abdomen was  prepped and draped in sterile fashion. Skin was anesthetized with 1% lidocaine. A 21 gauge needle was directed into the gallbladder from a transhepatic approach using real-time ultrasound guidance. Needle position confirmed within the gallbladder. 0.018 wire was placed. An Accustick dilator set was placed. J wire was placed. Tract was dilated to accommodate a 10.2 [place] multipurpose drain. 15 mL of brown purulent thick fluid was removed from the gallbladder. Gallbladder was decompressed after aspiration. Small amount of contrast was injected to confirm placement. Catheter was flushed with saline and attached to a gravity bag. Catheter was sutured to the skin. Fluoroscopic and ultrasound images were taken and saved for documentation. FINDINGS: Gallbladder wall thickening with sludge. 15 mL of purulent fluid was removed from the gallbladder. Gallbladder was decompressed at the end of the procedure. IMPRESSION: Successful percutaneous cholecystostomy tube placement. Electronically Signed   By: Richarda OverlieAdam  Henn M.D.   On: 06/28/2019 15:34     Pamella Pertostin Darrelle Barrell, MD, PhD [facility]  Contact via  [email or web address]  [facility] Office Info P: [phone] F: [phone]

## 2019-06-29 NOTE — Progress Notes (Signed)
Referring Physician(s): Dr Darnelle Spangle  Supervising Physician: Simonne Come  Patient Status:  [facility] - In-pt  Chief Complaint:  Perc chole drain placed 9/5  Subjective:  Feels some better already Up in bed Daughter at bedside Pt has eaten little today denies pain or N/V  Allergies: Myoview [technetium-19m] and Penicillins  Medications: Prior to Admission medications   Medication Sig Start Date End Date Taking? Authorizing Provider  carvedilol (COREG) 12.5 MG tablet Take 1 tablet (12.5 mg total) by mouth 2 (two) times daily with a meal. Patient taking differently: Take 6.25 mg by mouth 2 (two) times daily with a meal.  08/11/14  Yes Arida, Chelsea Aus, MD  Cyanocobalamin (VITAMIN B-12) 2500 MCG TABS Take 5,000 mcg by mouth daily. 08/02/14  Yes Vann, Jessica U, DO  furosemide (LASIX) 40 MG tablet Take 40 mg by mouth daily as needed for fluid.  08/02/14  Yes Vann, Jessica U, DO  gabapentin (NEURONTIN) 100 MG capsule 100 mg, 3 times a day as needed for nerve pain Patient taking differently: Take 100 mg by mouth 3 (three) times daily as needed (nerve pain).  09/11/14  Yes Reather Littler, MD  glipiZIDE-metformin (METAGLIP) 5-500 MG per tablet Take 1 tablet by mouth 2 (two) times daily before a meal.   Yes [provider]  levothyroxine (SYNTHROID) 100 MCG tablet Take 75-100 mcg by mouth See admin instructions. Taking 1 tablet ( ) Monday- Friday and on Saturday, Sunday   Yes [provider]  linagliptin (TRADJENTA) 5 MG TABS tablet Take 5 mg by mouth every morning.   Yes [provider]  Multiple Vitamin (MULTIVITAMIN) tablet Take 1 tablet by mouth daily.   Yes [provider]  rosuvastatin (CRESTOR) 10 MG tablet Take 1 tablet (10 mg total) by mouth daily after supper. [place] from [place] Patient taking differently: Take 10 mg by mouth daily after supper. ([place] from [place]) 07/24/14  Yes Barrett, Joline Salt, PA-C  clopidogrel (PLAVIX) 75 MG  tablet Take 75 mg by mouth daily.    [provider]  levothyroxine (SYNTHROID, LEVOTHROID) 125 MCG tablet Take 1 tablet (125 mcg total) by mouth daily before breakfast. Patient not taking: Reported on 06/27/2019 07/24/14   Barrett, Joline Salt, PA-C     Vital Signs: BP 107/69 (BP Location: Left Arm)   Pulse 88   Temp 98.6 F (37 C) (Oral)   Resp 20   Ht 5' (1.524 m)   Wt 135 lb 12.9 oz (61.6 kg)   SpO2 100%   BMI 26.52 kg/m   Physical Exam Skin:    General: Skin is warm and dry.     Comments: Site is clean and dry NT no bleeding Flushes easily No pain No recorded OP 20 cc in bag-- light bilious color  ABUNDANT WBC PRESENT, PREDOMINANTLY PMN  FEW GRAM NEGATIVE RODS  RARE GRAM POSITIVE RODS  Performed at [facility], [address], [place], [place] [address]   Culture FEW GRAM NEGATIVE RODS       Imaging: Ir Perc Cholecystostomy  Result Date: 06/28/2019 INDICATION: 85 year old with acute cholecystitis and not a good surgical candidate at this time. Plan for percutaneous cholecystostomy tube placement. EXAM: PERCUTANEOUS CHOLECYSTOSTOMY TUBE PLACEMENT WITH ULTRASOUND AND FLUOROSCOPIC GUIDANCE MEDICATIONS: Inpatient receiving antibiotics. ANESTHESIA/SEDATION: Moderate (conscious) sedation was employed during this procedure. A total of Versed 0.5 mg and Fentanyl 25 mcg was administered intravenously. Moderate Sedation Time: 11 minutes. The patient's level of consciousness and vital signs were monitored  continuously by radiology nursing throughout the procedure under my direct supervision. FLUOROSCOPY TIME:  Fluoroscopy Time: 1 minutes 48 seconds, 5 mGy COMPLICATIONS: None immediate. PROCEDURE: Informed written consent was obtained from the patient and daughter after a thorough discussion of the procedural risks, benefits and alternatives. All questions were addressed. Maximal Sterile Barrier Technique was utilized including caps, mask, sterile gowns, sterile gloves,  sterile drape, hand hygiene and skin antiseptic. A timeout was performed prior to the initiation of the procedure. Right side of the abdomen was evaluated with ultrasound. The right abdomen was prepped and draped in sterile fashion. Skin was anesthetized with 1% lidocaine. A 21 gauge needle was directed into the gallbladder from a transhepatic approach using real-time ultrasound guidance. Needle position confirmed within the gallbladder. 0.018 wire was placed. An Accustick dilator set was placed. J wire was placed. Tract was dilated to accommodate a 10.2 [place] multipurpose drain. 15 mL of brown purulent thick fluid was removed from the gallbladder. Gallbladder was decompressed after aspiration. Small amount of contrast was injected to confirm placement. Catheter was flushed with saline and attached to a gravity bag. Catheter was sutured to the skin. Fluoroscopic and ultrasound images were taken and saved for documentation. FINDINGS: Gallbladder wall thickening with sludge. 15 mL of purulent fluid was removed from the gallbladder. Gallbladder was decompressed at the end of the procedure. IMPRESSION: Successful percutaneous cholecystostomy tube placement. Electronically Signed   By: Markus Daft M.D.   On: 06/28/2019 15:34    Labs:  CBC: Recent Labs    06/26/19 0402 06/27/19 0341 06/28/19 0405 06/29/19 0346  WBC 8.9 12.1* 11.4* 10.3  HGB 9.3* 10.1* 8.8* 9.4*  HCT 30.0* 32.6* 28.1* 30.7*  PLT 131* 133* 119* 121*    COAGS: Recent Labs    06/28/19 0405  INR 1.4*    BMP: Recent Labs    06/26/19 0402 06/27/19 0341 06/28/19 0405 06/29/19 0346  NA 137 138 138 139  K 3.5 3.7 3.5 3.5  CL 107 109 107 109  CO2 19* 21* 18* 18*  GLUCOSE 126* 145* 191* 169*  BUN 22 18 18 21   CALCIUM 7.7* 7.7* 7.4* 7.4*  CREATININE 1.19* 1.15* 1.14* 1.24*  GFRNONAA 42* 44* 44* 40*  GFRAA 49* 51* 51* 47*    LIVER FUNCTION TESTS: Recent Labs    06/26/19 0402 06/27/19 0341 06/28/19 0405 06/29/19 0346   BILITOT 1.0 0.5 0.6 0.5  AST 13* 13* 13* 16  ALT 11 11 10 9   ALKPHOS 46 47 56 49  PROT 5.7* 6.0* 5.6* 5.4*  ALBUMIN 2.1* 2.2* 2.1* 2.1*    Assessment and Plan:  Percutaneous cholecystostomy drain placed in IR 9/5 Doing well Will need drain in place 8 weeks-- unless to OR IR will follow in OP clinic after DC  Electronically Signed: Lavonia Drafts, PA-C 06/29/2019, 1:48 PM   I spent a total of 15 Minutes at the the patient's bedside AND on the patient's hospital floor or unit, greater than 50% of which was counseling/coordinating care for perc chole drain

## 2019-06-29 NOTE — Progress Notes (Signed)
3 Days Post-Op   Subjective/Chief Complaint: Doing fine this am, perc chole in, no pain   Objective: Vital signs in last 24 hours: Temp:  [97.8 F (36.6 C)-98.7 F (37.1 C)] 97.8 F (36.6 C) (09/06 0515) Pulse Rate:  [82-104] 85 (09/06 0515) Resp:  [16-20] 20 (09/05 2135) BP: (97-150)/(43-77) 119/53 (09/06 0515) SpO2:  [99 %-100 %] 100 % (09/06 0515) Last BM Date: 06/28/19  Intake/Output from previous day: 09/05 0701 - 09/06 0700 In: 2910 [P.O.:420; I.V.:784.3; IV Piggyback:1705.7] Out: -  Intake/Output this shift: No intake/output data recorded.  GI: perc chole in place, not much out in bag now, soft nt   Lab Results:  Recent Labs    06/28/19 0405 06/29/19 0346  WBC 11.4* 10.3  HGB 8.8* 9.4*  HCT 28.1* 30.7*  PLT 119* 121*   BMET Recent Labs    06/28/19 0405 06/29/19 0346  NA 138 139  K 3.5 3.5  CL 107 109  CO2 18* 18*  GLUCOSE 191* 169*  BUN 18 21  CREATININE 1.14* 1.24*  CALCIUM 7.4* 7.4*   PT/INR Recent Labs    06/28/19 0405  LABPROT 17.0*  INR 1.4*   ABG No results for input(s): PHART, HCO3 in the last 72 hours.  Invalid input(s): PCO2, PO2  Studies/Results: Ir Perc Cholecystostomy  Result Date: 06/28/2019 INDICATION: 85 year old with acute cholecystitis and not a good surgical candidate at this time. Plan for percutaneous cholecystostomy tube placement. EXAM: PERCUTANEOUS CHOLECYSTOSTOMY TUBE PLACEMENT WITH ULTRASOUND AND FLUOROSCOPIC GUIDANCE MEDICATIONS: Inpatient receiving antibiotics. ANESTHESIA/SEDATION: Moderate (conscious) sedation was employed during this procedure. A total of Versed 0.5 mg and Fentanyl 25 mcg was administered intravenously. Moderate Sedation Time: 11 minutes. The patient's level of consciousness and vital signs were monitored continuously by radiology nursing throughout the procedure under my direct supervision. FLUOROSCOPY TIME:  Fluoroscopy Time: 1 minutes 48 seconds, 5 mGy COMPLICATIONS: None immediate. PROCEDURE:  Informed written consent was obtained from the patient and daughter after a thorough discussion of the procedural risks, benefits and alternatives. All questions were addressed. Maximal Sterile Barrier Technique was utilized including caps, mask, sterile gowns, sterile gloves, sterile drape, hand hygiene and skin antiseptic. A timeout was performed prior to the initiation of the procedure. Right side of the abdomen was evaluated with ultrasound. The right abdomen was prepped and draped in sterile fashion. Skin was anesthetized with 1% lidocaine. A 21 gauge needle was directed into the gallbladder from a transhepatic approach using real-time ultrasound guidance. Needle position confirmed within the gallbladder. 0.018 wire was placed. An Accustick dilator set was placed. J wire was placed. Tract was dilated to accommodate a 10.2 [place] multipurpose drain. 15 mL of brown purulent thick fluid was removed from the gallbladder. Gallbladder was decompressed after aspiration. Small amount of contrast was injected to confirm placement. Catheter was flushed with saline and attached to a gravity bag. Catheter was sutured to the skin. Fluoroscopic and ultrasound images were taken and saved for documentation. FINDINGS: Gallbladder wall thickening with sludge. 15 mL of purulent fluid was removed from the gallbladder. Gallbladder was decompressed at the end of the procedure. IMPRESSION: Successful percutaneous cholecystostomy tube placement. Electronically Signed   By: Richarda Overlie M.D.   On: 06/28/2019 15:34    Anti-infectives: Anti-infectives (From admission, onward)   Start     Dose/Rate Route Frequency Ordered Stop   06/26/19 2200  cefTRIAXone (ROCEPHIN) 2 g in sodium chloride 0.9 % 100 mL IVPB     2 g 200 mL/hr over 30  Minutes Intravenous Every 24 hours 06/26/19 1340     06/26/19 1400  metroNIDAZOLE (FLAGYL) IVPB 500 mg     500 mg 100 mL/hr over 60 Minutes Intravenous Every 8 hours 06/26/19 1340     06/24/19 2200   cefTRIAXone (ROCEPHIN) 2 g in sodium chloride 0.9 % 100 mL IVPB  Status:  Discontinued     2 g 200 mL/hr over 30 Minutes Intravenous Every 24 hours 06/24/19 0314 06/26/19 0954   06/24/19 1000  metroNIDAZOLE (FLAGYL) IVPB 500 mg  Status:  Discontinued     500 mg 100 mL/hr over 60 Minutes Intravenous Every 8 hours 06/24/19 0314 06/26/19 1340   06/24/19 0145  metroNIDAZOLE (FLAGYL) IVPB 500 mg     500 mg 100 mL/hr over 60 Minutes Intravenous  Once 06/24/19 0137 06/24/19 0609   06/24/19 0145  cefTRIAXone (ROCEPHIN) 2 g in sodium chloride 0.9 % 100 mL IVPB  Status:  Discontinued     2 g 200 mL/hr over 30 Minutes Intravenous  Once 06/24/19 0137 06/24/19 0137   06/24/19 0145  cefTRIAXone (ROCEPHIN) 1 g in sodium chloride 0.9 % 100 mL IVPB  Status:  Discontinued     1 g 200 mL/hr over 30 Minutes Intravenous  Once 06/24/19 0138 06/24/19 0534   06/23/19 2300  cefTRIAXone (ROCEPHIN) 1 g in sodium chloride 0.9 % 100 mL IVPB     1 g 200 mL/hr over 30 Minutes Intravenous  Once 06/23/19 2249 06/24/19 0244      Assessment/Plan: Cholecystitis s/p perc chole- [place] -regular diet, can switch to oral abx from my standpoint and dc home when ready -will need f/u drain clinic and with [facility] FEN -regular diet VTE -SCDs, she can have pharm dvt proph from my standpoint ID -Rocephin/Flagyl can switch to orals  Rolm Bookbinder 06/29/2019

## 2019-06-29 NOTE — Progress Notes (Signed)
Progress Note  Patient Name: Margaret [name] Date of Encounter: 06/29/2019  Primary Cardiologist: Lance Muss, MD   Subjective   No cardiac complaints Tube placement yesterday   Inpatient Medications    Scheduled Meds:  amiodarone  150 mg Intravenous Once   carvedilol  6.25 mg Oral BID WC   Chlorhexidine Gluconate Cloth  6 each Topical Daily   insulin aspart  0-5 Units Subcutaneous QHS   insulin aspart  0-9 Units Subcutaneous TID WC   levothyroxine  125 mcg Oral QAC breakfast   mupirocin ointment  1 application Nasal BID   rosuvastatin  10 mg Oral QPC supper   sodium chloride flush  3 mL Intravenous Once   vitamin B-12  5,000 mcg Oral Daily   Continuous Infusions:  sodium chloride Stopped (06/28/19 1207)   cefTRIAXone (ROCEPHIN)  IV Stopped (06/28/19 2230)   lactated ringers 10 mL/hr at 06/26/19 1018   metronidazole Stopped (06/29/19 0458)   PRN Meds: acetaminophen **OR** acetaminophen, calcium carbonate, gabapentin, morphine injection, ondansetron (ZOFRAN) IV   Vital Signs    Vitals:   06/28/19 1137 06/28/19 1437 06/28/19 2135 06/29/19 0515  BP: (!) 141/61 (!) 118/43 (!) 97/47 (!) 119/53  Pulse: 82 87 86 85  Resp: 18 17 20    Temp:  98.7 F (37.1 C) 98.4 F (36.9 C) 97.8 F (36.6 C)  TempSrc:  Oral Oral Oral  SpO2: 100% 100% 99% 100%  Weight:      Height:        Intake/Output Summary (Last 24 hours) at 06/29/2019 0802 Last data filed at 06/29/2019 0600 Gross per 24 hour  Intake 2910.04 ml  Output --  Net 2910.04 ml   Last 3 Weights 06/26/2019 06/25/2019 06/24/2019  Weight (lbs) 135 lb 12.9 oz 135 lb 12.9 oz 126 lb 15.8 oz  Weight (kg) 61.6 kg 61.6 kg 57.6 kg     Telemetry    Not on telemetry  ECG     No new- Personally Reviewed  Physical Exam   Affect appropriate Elderly indian female  HEENT: normal Neck supple with no adenopathy JVP normal no bruits no thyromegaly Lungs clear with no wheezing and good diaphragmatic motion Heart:   S1/S2 no murmur, no rub, gallop or click PMI normal Abdomen: Post cholecystostomy tube placement  Distal pulses intact with no bruits No edema Neuro non-focal Skin warm and dry No muscular weakness  Labs    High Sensitivity Troponin:   Recent Labs  Lab 06/27/19 0918  TROPONINIHS 28*      Chemistry Recent Labs  Lab 06/27/19 0341 06/28/19 0405 06/29/19 0346  NA 138 138 139  K 3.7 3.5 3.5  CL 109 107 109  CO2 21* 18* 18*  GLUCOSE 145* 191* 169*  BUN 18 18 21   CREATININE 1.15* 1.14* 1.24*  CALCIUM 7.7* 7.4* 7.4*  PROT 6.0* 5.6* 5.4*  ALBUMIN 2.2* 2.1* 2.1*  AST 13* 13* 16  ALT 11 10 9   ALKPHOS 47 56 49  BILITOT 0.5 0.6 0.5  GFRNONAA 44* 44* 40*  GFRAA 51* 51* 47*  ANIONGAP 8 13 12      Hematology Recent Labs  Lab 06/27/19 0341 06/28/19 0405 06/29/19 0346  WBC 12.1* 11.4* 10.3  RBC 3.33* 2.87* 3.16*  HGB 10.1* 8.8* 9.4*  HCT 32.6* 28.1* 30.7*  MCV 97.9 97.9 97.2  MCH 30.3 30.7 29.7  MCHC 31.0 31.3 30.6  RDW 13.5 13.8 14.1  PLT 133* 119* 121*    BNP Recent Labs  Lab 06/24/19  5956  BNP 414.2*     DDimer No results for input(s): DDIMER in the last 168 hours.   Radiology    Ir Perc Cholecystostomy  Result Date: 06/28/2019 INDICATION: 85 year old with acute cholecystitis and not a good surgical candidate at this time. Plan for percutaneous cholecystostomy tube placement. EXAM: PERCUTANEOUS CHOLECYSTOSTOMY TUBE PLACEMENT WITH ULTRASOUND AND FLUOROSCOPIC GUIDANCE MEDICATIONS: Inpatient receiving antibiotics. ANESTHESIA/SEDATION: Moderate (conscious) sedation was employed during this procedure. A total of Versed 0.5 mg and Fentanyl 25 mcg was administered intravenously. Moderate Sedation Time: 11 minutes. The patient's level of consciousness and vital signs were monitored continuously by radiology nursing throughout the procedure under my direct supervision. FLUOROSCOPY TIME:  Fluoroscopy Time: 1 minutes 48 seconds, 5 mGy COMPLICATIONS: None immediate.  PROCEDURE: Informed written consent was obtained from the patient and daughter after a thorough discussion of the procedural risks, benefits and alternatives. All questions were addressed. Maximal Sterile Barrier Technique was utilized including caps, mask, sterile gowns, sterile gloves, sterile drape, hand hygiene and skin antiseptic. A timeout was performed prior to the initiation of the procedure. Right side of the abdomen was evaluated with ultrasound. The right abdomen was prepped and draped in sterile fashion. Skin was anesthetized with 1% lidocaine. A 21 gauge needle was directed into the gallbladder from a transhepatic approach using real-time ultrasound guidance. Needle position confirmed within the gallbladder. 0.018 wire was placed. An Accustick dilator set was placed. J wire was placed. Tract was dilated to accommodate a 10.2 [place] multipurpose drain. 15 mL of brown purulent thick fluid was removed from the gallbladder. Gallbladder was decompressed after aspiration. Small amount of contrast was injected to confirm placement. Catheter was flushed with saline and attached to a gravity bag. Catheter was sutured to the skin. Fluoroscopic and ultrasound images were taken and saved for documentation. FINDINGS: Gallbladder wall thickening with sludge. 15 mL of purulent fluid was removed from the gallbladder. Gallbladder was decompressed at the end of the procedure. IMPRESSION: Successful percutaneous cholecystostomy tube placement. Electronically Signed   By: Markus Daft M.D.   On: 06/28/2019 15:34    Cardiac Studies   Echo 06/25/19 IMPRESSIONS  1. The left ventricle has normal systolic function, with an ejection fraction of 55-60%. The cavity size was normal. Left ventricular diastolic Doppler parameters are consistent with pseudonormalization. Elevated left atrial and left ventricular  end-diastolic pressures.  2. The right ventricle has normal systolic function. The cavity was normal. There is no  increase in right ventricular wall thickness. Right ventricular systolic pressure is moderately elevated with an estimated pressure of 55.6 mmHg.  3. Left atrial size was mildly dilated.  4. Right atrial size was mildly dilated.  5. The mitral valve is degenerative.  6. Tricuspid valve regurgitation is moderate.  7. The aortic valve is tricuspid. Moderate thickening of the aortic valve. Moderate calcification of the aortic valve. Mild-moderate stenosis of the aortic valve.  8. The aorta is normal unless otherwise noted.  Patient Profile     85 y.o. female with a hx of pulmonary HTN, HTN, HLD, DM2, PVD, CAD (prior LAD stent occlusion, with progression of CAD to LM disease--CABG), CKD stage III, and Chronic diastolic heart failure, and hypothyroidism who is being seen today for the evaluation of pre-op clearance for possible cholecystectomy at the request of Dr. Avon Gully.   Assessment & Plan    Pre-op Clearance CAD s/p CABG 3875 Chronic diastolic Heart Failure CAD Hyperlipidemia Mild to moderate Mitral valve regurgitation, ischemic CKD DM2 Pulmonary HTN  Stable  no angina RUQ pain from cholecystitis  No afib. Rhythm sinus rates 90's  Post tube placement   Will sign off    Charlton HawsPeter Mabel Roll, MD 06/29/2019

## 2019-06-30 DIAGNOSIS — I25700 Atherosclerosis of coronary artery bypass graft(s), unspecified, with unstable angina pectoris: Secondary | ICD-10-CM

## 2019-06-30 LAB — COMPREHENSIVE METABOLIC PANEL
ALT: 8 U/L (ref 0–44)
AST: 12 U/L — ABNORMAL LOW (ref 15–41)
Albumin: 2.1 g/dL — ABNORMAL LOW (ref 3.5–5.0)
Alkaline Phosphatase: 50 U/L (ref 38–126)
Anion gap: 9 (ref 5–15)
BUN: 20 mg/dL (ref 8–23)
CO2: 21 mmol/L — ABNORMAL LOW (ref 22–32)
Calcium: 7.6 mg/dL — ABNORMAL LOW (ref 8.9–10.3)
Chloride: 107 mmol/L (ref 98–111)
Creatinine, Ser: 1.2 mg/dL — ABNORMAL HIGH (ref 0.44–1.00)
GFR calc Af Amer: 48 mL/min — ABNORMAL LOW (ref >60)
GFR calc non Af Amer: 42 mL/min — ABNORMAL LOW (ref >60)
Glucose, Bld: 178 mg/dL — ABNORMAL HIGH (ref 70–99)
Potassium: 3.6 mmol/L (ref 3.5–5.1)
Sodium: 137 mmol/L (ref 135–145)
Total Bilirubin: 0.4 mg/dL (ref 0.3–1.2)
Total Protein: 5.8 g/dL — ABNORMAL LOW (ref 6.5–8.1)

## 2019-06-30 LAB — CBC
HCT: 31.8 % — ABNORMAL LOW (ref 36.0–46.0)
Hemoglobin: 10.2 g/dL — ABNORMAL LOW (ref 12.0–15.0)
MCH: 30.1 pg (ref 26.0–34.0)
MCHC: 32.1 g/dL (ref 30.0–36.0)
MCV: 93.8 fL (ref 80.0–100.0)
Platelets: 137 10*3/uL — ABNORMAL LOW (ref 150–400)
RBC: 3.39 MIL/uL — ABNORMAL LOW (ref 3.87–5.11)
RDW: 14.2 % (ref 11.5–15.5)
WBC: 12.9 10*3/uL — ABNORMAL HIGH (ref 4.0–10.5)
nRBC: 0 % (ref 0.0–0.2)

## 2019-06-30 LAB — GLUCOSE, CAPILLARY
Glucose-Capillary: 170 mg/dL — ABNORMAL HIGH (ref 70–99)
Glucose-Capillary: 212 mg/dL — ABNORMAL HIGH (ref 70–99)

## 2019-06-30 MED ORDER — CARVEDILOL 12.5 MG PO TABS: 6.2500 mg | ORAL_TABLET | Freq: Two times a day (BID) | ORAL | 0 refills | Status: AC

## 2019-06-30 MED ORDER — CIPROFLOXACIN HCL 500 MG PO TABS: 500.0000 mg | ORAL_TABLET | Freq: Two times a day (BID) | ORAL | 0 refills | Status: AC

## 2019-06-30 MED ORDER — METRONIDAZOLE 500 MG PO TABS: 500.0000 mg | ORAL_TABLET | Freq: Three times a day (TID) | ORAL | 0 refills | Status: AC

## 2019-06-30 MED ORDER — SODIUM CHLORIDE 0.9% FLUSH: 10.0000 mL | Freq: Every day | INTRAVENOUS | 1 refills | Status: AC

## 2019-06-30 NOTE — TOC Initial Note (Signed)
Transition of Care [facility]) - Initial/Assessment Note    Patient Details  Name: Margaret [name] MRN: [id number] Date of Birth: [birth year]  Transition of [facility] CM/SW Contact:    Marilu Favre, RN Phone Number: 06/30/2019, 11:24 AM  Clinical Narrative:                  Spoke to patient and daughter Henna at bedside. Patient from home with husband , Julianne Rice and her family live with patient also.   Explained family will be taught drain care including flushes  Because home health will not be there everyday. Henna voiced understanding.    Henna requesting shower chair and walker . Explained [employer] is closed today so DME will NOT be delivered to room prior to discharge. Henna aware.   Called [employer] ordered shower chair and walker, patient has an account from 2011. Faxed all documentation to [employer] [phone] . Huey Romans will call Henna regarding delivery and cost . Henna aware.  Expected Discharge Plan: [place] Barriers to Discharge: No Barriers Identified   Patient Goals and CMS Choice Patient states their goals for this hospitalization and ongoing recovery are:: to go home CMS Medicare.gov Compare Post Acute Care list provided to:: Patient(DAughter Henna) Choice offered to / list presented to : Patient, Adult Children  Expected Discharge Plan and Services Expected Discharge Plan: [place]   Discharge Planning Services: CM Consult Post Acute Care Choice: [place] arrangements for the past 2 months: Single Family Home Expected Discharge Date: 06/30/19               DME Arranged: Shower stool, Walker rolling DME Agency: [place] Date DME Agency Contacted: 06/30/19 Time DME Agency Contacted: [phone] Representative spoke with at DME Agency: [place]: RN [place] Agency: [address] Date [place]: 06/30/19 Time [place]: 69 Representative spoke with at [place]: [place]  Arrangements/Services Living arrangements for the past 2 months: [place] with:: Self, Adult Children Patient language and need for interpreter reviewed:: Yes Do you feel safe going back to the place where you live?: Yes      Need for Family Participation in Patient Care: Yes (Comment) Care giver support system in place?: Yes (comment)   Criminal Activity/Legal Involvement Pertinent to Current Situation/Hospitalization: No - Comment as needed  Activities of Daily Living Home Assistive Devices/Equipment: None ADL Screening (condition at time of admission) Patient's cognitive ability adequate to safely complete daily activities?: Yes Is the patient deaf or have difficulty hearing?: No Does the patient have difficulty seeing, even when wearing glasses/contacts?: No Does the patient have difficulty concentrating, remembering, or making decisions?: No Patient able to express need for assistance with ADLs?: Yes Does the patient have difficulty dressing or bathing?: No Independently performs ADLs?: Yes (appropriate for developmental age) Does the patient have difficulty walking or climbing stairs?: No Weakness of Legs: None Weakness of Arms/Hands: None  Permission Sought/Granted   Permission granted to share information with : Yes, Verbal Permission Granted     Permission granted to share info w AGENCY: [facility]        Emotional Assessment Appearance:: Appears stated age Attitude/Demeanor/Rapport: Engaged Affect (typically observed): Accepting Orientation: : Oriented to Self, Oriented to Place, Oriented to  Time, Oriented to Situation Alcohol / Substance Use: Not Applicable Psych Involvement: No (comment)  Admission diagnosis:  RUQ pain [R10.11] Acute cystitis without hematuria [  N30.00] Patient Active Problem List   Diagnosis Date Noted  . Sinus tachycardia   . CKD (chronic kidney disease), stage III (HCC) 06/24/2019  . UTI (urinary tract infection)  06/24/2019  . Hyponatremia 06/24/2019  . Type II diabetes mellitus with renal manifestations (HCC) 06/24/2019  . Abdominal pain 06/24/2019  . Sepsis (HCC) 06/24/2019  . Hypothyroidism 06/24/2019  . DM type 2 with diabetic peripheral neuropathy (HCC) 09/13/2014  . CAD (coronary artery disease) of artery bypass graft 08/13/2014  . ARF (acute renal failure) (HCC) 07/31/2014  . Acute on chronic renal failure (HCC) 07/31/2014  . Acute renal failure (HCC) 07/31/2014  . Mitral valve regurgitation, ischemic 07/23/2014  . Moderate to severe pulmonary hypertension (HCC) 07/23/2014  . Acute renal insufficiency 07/19/2014  . Acute on chronic diastolic heart failure (HCC) 07/17/2014  . Acute respiratory failure with hypoxia and hypercapnia - secondary to Acute CHF & pulmonary edema 07/17/2014  . Acute pulmonary edema with congestive heart failure (HCC) 07/17/2014    Class: Present on Admission  . Chronic diastolic CHF (congestive heart failure), NYHA class 2 (HCC)   . Hyperlipidemia with target LDL less than 70   . Atherosclerotic peripheral vascular disease with intermittent claudication (HCC) 04/22/2014  . Type 2 diabetes mellitus (HCC)   . Thyroid disorder   . Vitamin B12 deficiency   . Left main coronary artery disease 03/23/2010   PCP:  Ileana Ladd, MD Pharmacy:   [facility] [address] [place] - [address] [place] [address] Phone: [phone] Fax: [phone]     Social Determinants of Health (SDOH) Interventions    Readmission Risk Interventions No flowsheet data found.

## 2019-06-30 NOTE — Progress Notes (Signed)
Daughter at bedside. Showed patient and daughter how to empty drain bag and to care for the drain. Verbalized understanding of the instructions

## 2019-06-30 NOTE — Discharge Instructions (Signed)
Follow with Vernie Shanks, MD in 5-7 days  Please get a complete blood count and chemistry panel checked by your Primary MD at your next visit, and again as instructed by your Primary MD. Please get your medications reviewed and adjusted by your Primary MD.  Please request your Primary MD to go over all Hospital Tests and Procedure/Radiological results at the follow up, please get all Hospital records sent to your Prim MD by signing hospital release before you go home.  In some cases, there will be blood work, cultures and biopsy results pending at the time of your discharge. Please request that your primary care M.D. goes through all the records of your hospital data and follows up on these results.  If you had Pneumonia of Lung problems at the Hospital: Please get a 2 view Chest X ray done in 6-8 weeks after hospital discharge or sooner if instructed by your Primary MD.  If you have Congestive Heart Failure: Please call your Cardiologist or Primary MD anytime you have any of the following symptoms:  1) 3 pound weight gain in 24 hours or 5 pounds in 1 week  2) shortness of breath, with or without a dry hacking cough  3) swelling in the hands, feet or stomach  4) if you have to sleep on extra pillows at night in order to breathe  Follow cardiac low salt diet and 1.5 lit/day fluid restriction.  If you have diabetes Accuchecks 4 times/day, Once in AM empty stomach and then before each meal. Log in all results and show them to your primary doctor at your next visit. If any glucose reading is under 80 or above 300 call your primary MD immediately.  If you have Seizure/Convulsions/Epilepsy: Please do not drive, operate heavy machinery, participate in activities at heights or participate in high speed sports until you have seen by Primary MD or a Neurologist and advised to do so again. Per [facility] statutes, patients with seizures are not allowed to drive until they have been  seizure-free for six months.  Use caution when using heavy equipment or power tools. Avoid working on ladders or at heights. Take showers instead of baths. Ensure the water temperature is not too high on the home water heater. Do not go swimming alone. Do not lock yourself in a room alone (i.e. bathroom). When caring for infants or small children, sit down when holding, feeding, or changing them to minimize risk of injury to the child in the event you have a seizure. Maintain good sleep hygiene. Avoid alcohol.   If you had Gastrointestinal Bleeding: Please ask your Primary MD to check a complete blood count within one week of discharge or at your next visit. Your endoscopic/colonoscopic biopsies that are pending at the time of discharge, will also need to followed by your Primary MD.  Get Medicines reviewed and adjusted. Please take all your medications with you for your next visit with your Primary MD  Please request your Primary MD to go over all hospital tests and procedure/radiological results at the follow up, please ask your Primary MD to get all Hospital records sent to his/her office.  If you experience worsening of your admission symptoms, develop shortness of breath, life threatening emergency, suicidal or homicidal thoughts you must seek medical attention immediately by calling 911 or calling your MD immediately  if symptoms less severe.  You must read complete instructions/literature along with all the possible adverse reactions/side effects for all the Medicines you  take and that have been prescribed to you. Take any new Medicines after you have completely understood and accpet all the possible adverse reactions/side effects.   Do not drive or operate heavy machinery when taking Pain medications.   Do not take more than prescribed Pain, Sleep and Anxiety Medications  Special Instructions: If you have smoked or chewed Tobacco  in the last 2 yrs please stop smoking, stop any regular  Alcohol  and or any Recreational drug use.  Wear Seat belts while driving.  Please note You were cared for by a hospitalist during your hospital stay. If you have any questions about your discharge medications or the care you received while you were in the hospital after you are discharged, you can call the unit and asked to speak with the hospitalist on call if the hospitalist that took care of you is not available. Once you are discharged, your primary care physician will handle any further medical issues. Please note that NO REFILLS for any discharge medications will be authorized once you are discharged, as it is imperative that you return to your primary care physician (or establish a relationship with a primary care physician if you do not have one) for your aftercare needs so that they can reassess your need for medications and monitor your lab values.  You can reach the hospitalist office at phone [phone] or fax [phone]   If you do not have a primary care physician, you can call [phone] for a physician referral.  Activity: As tolerated with Full fall precautions use walker/cane & assistance as needed    Diet: regular  Disposition Home

## 2019-06-30 NOTE — Discharge Summary (Signed)
Physician Discharge Summary  [name] ZOX:[id number] DOB: 02/12/1934 DOA: 06/23/2019  PCP: Ileana Ladd, MD  Admit date: 06/23/2019 Discharge date: 06/30/2019  Admitted From: home Disposition:  home  Recommendations for Outpatient Follow-up:  1. Follow up with PCP in 1-2 weeks 2. Please follow-up with general surgery in 2 weeks 3. Please follow-up with IR/drain care in 8 weeks  Home Health: RN Equipment/Devices: None  Discharge Condition: Stable CODE STATUS: Full code Diet recommendation: Regular  HPI: Per admitting MD, Margaret [name] is a 85 y.o. female with medical history significant of hyperlipidemia, diabetes mellitus, hypothyroidism, vitamin B12 deficiency, PVD, pulmonary hypertension, CAD, CABG, sCHF with EF of 45%, CKD stage III, who presents with RUQ abdominal pain. Pt speaks gujarati and hindi, and little [place]. The history is obtained with her daughter's help. Per her daughter, patient started having chest pain since yesterday, which is located in the right upper quadrant, constant, sharp, radiating to the back.  Patient does not have nausea vomiting or diarrhea.  Patient had mild fever of 100.  Patient was seen in urgent care, and was sent to ED for further evaluation treatment.  Patient does not have chest pain, cough, shortness of breath.  Patient has mild discomfort on urination. No unilateral weakness. ED Course: pt was found to have WBC 15.7, positive urinalysis (turbid appearance, large amount of leukocyte, many bacteria, WBC>50), pending COVID-19 test, lactic acid 1.8, stable renal function, sodium 128, currently temperature 99, blood pressure 138/82, heart rate 80 to 90s, oxygen sat 98% on room air.  Chest x-ray showed stable cardiomegaly with mild perihilar vascular congestion.    Hospital Course: Principal Problem Acute cholecystitis -General surgery was consulted and followed patient while hospitalized.  She initially underwent a right upper quadrant ultrasound  followed by HIDA scan suggesting acute cholecystitis.  Initial plans were for patient to go to the operating room for cholecystectomy on 9/3, however she developed atrial tachycardia with concern for A. fib, her surgery was canceled and cardiology was consulted.  Given delays, per surgical team, the safest option was considered to be percutaneous cholecystostomy instead of cholecystectomy.  Interventional radiology was consulted and patient underwent percutaneous cholecystostomy placement on 06/28/2019.  Patient was maintained on antibiotics at all times.  Cultures grew E. coli which was resistant to ceftriaxone, however she clinically improved.  She will be transitioned to ciprofloxacin based on sensitivities, she is clinically stable, and will be discharged in stable condition with 7 additional days of antibiotics.  Metronidazole has also been added to her regimen to have anaerobic coverage.  Her nausea, vomiting, abdominal pain have resolved, she is able to tolerate a regular diet with minimal pain at the drain site, have discussed with general surgery and she is stable for home.  Active Problems Sepsis due to UTI -treated with antibiotics as above Atrial tachicardia with PACs -cardiology evaluated patient, they do not feel like this represented A. fib.  She has maintained sinus with rest of her hospital stay Chronic combined CHF, history of CAD/CABG -2D echo 2011 showed an EF of 45%.  Repeat echo studies have showed the EF is now improved.  Appears euvolemic, continue home medications and Lasix as needed Hypothyroidism -Continue Synthroid Chronic kidney disease stage III -Unclear baseline, in 2015 creatinine was 2.0.  Overall stable, 1.2 on discharge Type 2 diabetes mellitus -A1c 7.1 which is at goal in her age group Hyponatremia -resolved  Discharge Diagnoses:  Principal Problem:   Abdominal pain Active Problems:   Vitamin B12  deficiency   Chronic diastolic CHF (congestive heart failure), NYHA  class 2 (HCC)   Hyperlipidemia with target LDL less than 70   CAD (coronary artery disease) of artery bypass graft   CKD (chronic kidney disease), stage III (HCC)   UTI (urinary tract infection)   Hyponatremia   Type II diabetes mellitus with renal manifestations (HCC)   Sepsis (HCC)   Hypothyroidism   Sinus tachycardia   Discharge Instructions  Allergies as of 06/30/2019      Reactions   Myoview [technetium-69m] Shortness Of Breath   Penicillins Other (See Comments)   Unknown allergic reaction       Medication List    TAKE these medications   carvedilol 12.5 MG tablet Commonly known as: COREG Take 0.5 tablets (6.25 mg total) by mouth 2 (two) times daily with a meal.   ciprofloxacin 500 MG tablet Commonly known as: Cipro Take 1 tablet (500 mg total) by mouth 2 (two) times daily for 7 days.   clopidogrel 75 MG tablet Commonly known as: PLAVIX Take 75 mg by mouth daily.   Cyanocobalamin 2500 MCG Tabs Take 5,000 mcg by mouth daily.   furosemide 40 MG tablet Commonly known as: LASIX Take 40 mg by mouth daily as needed for fluid.   gabapentin 100 MG capsule Commonly known as: NEURONTIN 100 mg, 3 times a day as needed for nerve pain What changed:   how much to take  how to take this  when to take this  reasons to take this  additional instructions   glipiZIDE-metformin 5-500 MG tablet Commonly known as: METAGLIP Take 1 tablet by mouth 2 (two) times daily before a meal.   levothyroxine 100 MCG tablet Commonly known as: SYNTHROID Take 75-100 mcg by mouth See admin instructions. Taking 1 tablet ( ) Monday- Friday and on Saturday, Sunday What changed: Another medication with the same name was removed. Continue taking this medication, and follow the directions you see here.   metroNIDAZOLE 500 MG tablet Commonly known as: Flagyl Take 1 tablet (500 mg total) by mouth 3 (three) times daily for 7 days.   multivitamin tablet Take 1 tablet by mouth  daily.   rosuvastatin 10 MG tablet Commonly known as: CRESTOR Take 1 tablet (10 mg total) by mouth daily after supper. Rosavel from [place] What changed: additional instructions   sodium chloride flush 0.9 % Soln Commonly known as: NS 10 mLs by Other route daily. Please flush drain with 10 cc saline daily   Tradjenta 5 MG Tabs tablet Generic drug: linagliptin Take 5 mg by mouth every morning.            Durable Medical Equipment  (From admission, onward)         Start     Ordered   06/30/19 1054  For home use only DME Walker rolling  Once    Comments: Please call daughter Jeanann Lewandowsky [phone]  Question:  Patient needs a walker to treat with the following condition  Answer:  Cholecystitis   06/30/19 1054   06/30/19 1054  For home use only DME Shower stool  Once    Comments: Shower chair   Please call daughter Jeanann Lewandowsky [phone] Thanks   06/30/19 1054         Follow-up Information    Manus Rudd, MD Follow up in 1 month(s).   Specialty: General Surgery Contact information: [address] [place] [address] [phone]  Richarda Overlie, MD Follow up in 8 week(s).   Specialties: Interventional Radiology, Radiology Why: follow up in OP IR Clinic 8 weeks-- flush daily 10 cc sterile saline-- record OP-- we will call pt with date and time of follow up appt; call [phone] if any questions Contact information: [address] [place] [address] [phone]        [employer] Follow up.   Why: Brewing technologist information: [address] [place] [address] [phone]        [facility] Follow up.   Specialty: Home Health Services Contact information: [address] [place] [address] [phone]           Consultations:  General surgery   Cardiology  Interventional radiology  Procedures/Studies:  Percutaneous cholecystostomy  06/28/2019  Nm Hepatobiliary Liver Func  Result Date: 06/24/2019 CLINICAL DATA:  Cholelithiasis EXAM: NUCLEAR MEDICINE HEPATOBILIARY IMAGING TECHNIQUE: Sequential images of the abdomen were obtained out to 60 minutes following intravenous administration of radiopharmaceutical. RADIOPHARMACEUTICALS:  5.07 mCi Tc-50m  Choletec IV COMPARISON:  None FINDINGS: Normal tracer clearance from bloodstream indicating normal hepatocellular function. Prompt excretion of tracer into the biliary tree. Small bowel visualized at 15 minutes. At 55 minutes, gallbladder had not visualized. Patient's blood pressure was borderline low, precluding IV morphine administration. Delayed imaging was performed at 1611 hours. No definite delayed visualization of the gallbladder is seen. A tiny focus of tracer to the RIGHT lateral aspect of the CBD is felt to represent the duodenal bulb. IMPRESSION: Patent CBD. Nonvisualization of the gallbladder despite delayed imaging at nearly 4 hours, consistent with acute cholecystitis. Electronically Signed   By: Ulyses Southward M.D.   On: 06/24/2019 17:16   Ir Perc Cholecystostomy  Result Date: 06/28/2019 INDICATION: 85 year old with acute cholecystitis and not a good surgical candidate at this time. Plan for percutaneous cholecystostomy tube placement. EXAM: PERCUTANEOUS CHOLECYSTOSTOMY TUBE PLACEMENT WITH ULTRASOUND AND FLUOROSCOPIC GUIDANCE MEDICATIONS: Inpatient receiving antibiotics. ANESTHESIA/SEDATION: Moderate (conscious) sedation was employed during this procedure. A total of Versed 0.5 mg and Fentanyl 25 mcg was administered intravenously. Moderate Sedation Time: 11 minutes. The patient's level of consciousness and vital signs were monitored continuously by radiology nursing throughout the procedure under my direct supervision. FLUOROSCOPY TIME:  Fluoroscopy Time: 1 minutes 48 seconds, 5 mGy COMPLICATIONS: None immediate. PROCEDURE: Informed written consent was obtained from the patient and  daughter after a thorough discussion of the procedural risks, benefits and alternatives. All questions were addressed. Maximal Sterile Barrier Technique was utilized including caps, mask, sterile gowns, sterile gloves, sterile drape, hand hygiene and skin antiseptic. A timeout was performed prior to the initiation of the procedure. Right side of the abdomen was evaluated with ultrasound. The right abdomen was prepped and draped in sterile fashion. Skin was anesthetized with 1% lidocaine. A 21 gauge needle was directed into the gallbladder from a transhepatic approach using real-time ultrasound guidance. Needle position confirmed within the gallbladder. 0.018 wire was placed. An Accustick dilator set was placed. J wire was placed. Tract was dilated to accommodate a 10.2 [place] multipurpose drain. 15 mL of brown purulent thick fluid was removed from the gallbladder. Gallbladder was decompressed after aspiration. Small amount of contrast was injected to confirm placement. Catheter was flushed with saline and attached to a gravity bag. Catheter was sutured to the skin. Fluoroscopic and ultrasound images were taken and saved for documentation. FINDINGS: Gallbladder wall thickening with sludge. 15 mL of purulent fluid  was removed from the gallbladder. Gallbladder was decompressed at the end of the procedure. IMPRESSION: Successful percutaneous cholecystostomy tube placement. Electronically Signed   By: Richarda OverlieAdam  Henn M.D.   On: 06/28/2019 15:34   Dg Chest Port 1 View  Result Date: 06/23/2019 CLINICAL DATA:  Initial evaluation for acute fever, epigastric pain. EXAM: PORTABLE CHEST 1 VIEW COMPARISON:  Prior radiograph from 07/31/2014. FINDINGS: Median sternotomy wires underlying surgical clips and CABG markers noted. Cardiomegaly, stable. Mediastinal silhouette normal. Aortic atherosclerosis. Lungs mildly hypoinflated. Perihilar vascular congestion without overt pulmonary edema. No pleural effusion. No consolidative  airspace opacity. No pneumothorax. Few scattered calcified granulomata noted. No acute osseous abnormality. Soft tissue calcifications overlie the left neck and chest. IMPRESSION: 1. Stable cardiomegaly with mild perihilar vascular congestion without overt pulmonary edema. 2. No other active cardiopulmonary disease. Electronically Signed   By: Rise MuBenjamin  McClintock M.D.   On: 06/23/2019 23:25   [place] Abdomen Limited Ruq  Result Date: 06/24/2019 CLINICAL DATA:  Right upper quadrant pain. EXAM: ULTRASOUND ABDOMEN LIMITED RIGHT UPPER QUADRANT COMPARISON:  None. FINDINGS: Gallbladder: Distended with multiple gallstones. Borderline wall thickening of 3-4 mm. No pericholecystic fluid. No sonographic Murphy sign noted by sonographer. Common bile duct: Diameter: 3-4 mm, normal. Liver: No focal lesion identified. Within normal limits in parenchymal echogenicity. Portal vein is patent on color Doppler imaging with normal direction of blood flow towards the liver. Other: None. IMPRESSION: 1. Gallstones with borderline wall thickening of 3-4 mm. Consider nuclear medicine HIDA scan if there is clinical concern for acute cholecystitis. No sonographic Murphy sign or pericholecystic fluid. 2. No biliary dilatation. Electronically Signed   By: Narda RutherfordMelanie  Sanford M.D.   On: 06/24/2019 01:20      Subjective: - no chest pain, shortness of breath, no abdominal pain, nausea or vomiting.   Discharge Exam: BP 119/63 (BP Location: Left Arm)    Pulse 87    Temp 97.9 F (36.6 C) (Oral)    Resp 17    Ht 5' (1.524 m)    Wt 61 kg    SpO2 100%    BMI 26.26 kg/m   General: Pt is alert, awake, not in acute distress Cardiovascular: RRR, S1/S2 +, no rubs, no gallops Respiratory: CTA bilaterally, no wheezing, no rhonchi Abdominal: Soft, NT, ND, bowel sounds + Extremities: no edema, no cyanosis    The results of significant diagnostics from this hospitalization (including imaging, microbiology, ancillary and laboratory) are listed  below for reference.     Microbiology: Recent Results (from the past 240 hour(s))  Blood culture (routine x 2)     Status: None   Collection Time: 06/23/19 11:35 PM   Specimen: BLOOD  Result Value Ref Range Status   Specimen Description BLOOD RIGHT ARM  Final   Special Requests   Final    BOTTLES DRAWN AEROBIC AND ANAEROBIC Blood Culture results may not be optimal due to an inadequate volume of blood received in culture bottles   Culture   Final    NO GROWTH 5 DAYS Performed at [facility], [address] [place], [place] [address]    Report Status 06/29/2019 FINAL  Final  Blood culture (routine x 2)     Status: None   Collection Time: 06/23/19 11:40 PM   Specimen: BLOOD  Result Value Ref Range Status   Specimen Description BLOOD LEFT ARM  Final   Special Requests   Final    BOTTLES DRAWN AEROBIC AND ANAEROBIC Blood Culture adequate volume   Culture  Final    NO GROWTH 5 DAYS Performed at [facility], [place] [address] [place], [place] [address]    Report Status 06/29/2019 FINAL  Final  SARS Coronavirus 2 [facility] order, Performed in [facility] hospital lab) Nasopharyngeal Nasopharyngeal Swab     Status: None   Collection Time: 06/24/19  3:05 AM   Specimen: Nasopharyngeal Swab  Result Value Ref Range Status   SARS Coronavirus 2 NEGATIVE NEGATIVE Final    Comment: (NOTE) If result is NEGATIVE SARS-CoV-2 target nucleic acids are NOT DETECTED. The SARS-CoV-2 RNA is generally detectable in upper and lower  respiratory specimens during the acute phase of infection. The lowest  concentration of SARS-CoV-2 viral copies this assay can detect is 250  copies / mL. A negative result does not preclude SARS-CoV-2 infection  and should not be used as the sole basis for treatment or other  patient management decisions.  A negative result may occur with  improper specimen collection / handling, submission of specimen other  than nasopharyngeal swab, presence of viral  mutation(s) within the  areas targeted by this assay, and inadequate number of viral copies  (<250 copies / mL). A negative result must be combined with clinical  observations, patient history, and epidemiological information. If result is POSITIVE SARS-CoV-2 target nucleic acids are DETECTED. The SARS-CoV-2 RNA is generally detectable in upper and lower  respiratory specimens dur ing the acute phase of infection.  Positive  results are indicative of active infection with SARS-CoV-2.  Clinical  correlation with patient history and other diagnostic information is  necessary to determine patient infection status.  Positive results do  not rule out bacterial infection or co-infection with other viruses. If result is PRESUMPTIVE POSTIVE SARS-CoV-2 nucleic acids MAY BE PRESENT.   A presumptive positive result was obtained on the submitted specimen  and confirmed on repeat testing.  While 2019 novel coronavirus  (SARS-CoV-2) nucleic acids may be present in the submitted sample  additional confirmatory testing may be necessary for epidemiological  and / or clinical management purposes  to differentiate between  SARS-CoV-2 and other Sarbecovirus currently known to infect humans.  If clinically indicated additional testing with an alternate test  methodology [phone]) is advised. The SARS-CoV-2 RNA is generally  detectable in upper and lower respiratory sp ecimens during the acute  phase of infection. The expected result is Negative. Fact Sheet for Patients:  StrictlyIdeas.no Fact Sheet for Healthcare Providers: BankingDealers.co.za This test is not yet approved or cleared by the [place] FDA and has been authorized for detection and/or diagnosis of SARS-CoV-2 by FDA under an Emergency Use Authorization (EUA).  This EUA will remain in effect (meaning this test can be used) for the duration of the COVID-19 declaration under Section 564(b)(1)  of the Act, 21 U.S.C. section 360bbb-3(b)(1), unless the authorization is terminated or revoked sooner. Performed at [facility], [place] [address] [place], [place] [address]   Gastrointestinal Panel by PCR , Stool     Status: None   Collection Time: 06/24/19 11:52 PM   Specimen: STOOL  Result Value Ref Range Status   Campylobacter species NOT DETECTED NOT DETECTED Final   Plesimonas shigelloides NOT DETECTED NOT DETECTED Final   Salmonella species NOT DETECTED NOT DETECTED Final   Yersinia enterocolitica NOT DETECTED NOT DETECTED Final   Vibrio species NOT DETECTED NOT DETECTED Final   Vibrio cholerae NOT DETECTED NOT DETECTED Final   Enteroaggregative E coli (EAEC) NOT DETECTED NOT DETECTED Final  Enteropathogenic E coli (EPEC) NOT DETECTED NOT DETECTED Final   Enterotoxigenic E coli (ETEC) NOT DETECTED NOT DETECTED Final   Shiga like toxin producing E coli (STEC) NOT DETECTED NOT DETECTED Final   Shigella/Enteroinvasive E coli (EIEC) NOT DETECTED NOT DETECTED Final   Cryptosporidium NOT DETECTED NOT DETECTED Final   Cyclospora cayetanensis NOT DETECTED NOT DETECTED Final   Entamoeba histolytica NOT DETECTED NOT DETECTED Final   Giardia lamblia NOT DETECTED NOT DETECTED Final   Adenovirus F40/41 NOT DETECTED NOT DETECTED Final   Astrovirus NOT DETECTED NOT DETECTED Final   Norovirus GI/GII NOT DETECTED NOT DETECTED Final   Rotavirus A NOT DETECTED NOT DETECTED Final   Sapovirus (I, II, IV, and V) NOT DETECTED NOT DETECTED Final    Comment: Performed at [facility], [address] [place], [place] [address]  C Difficile Quick Screen w PCR reflex     Status: None   Collection Time: 06/24/19 11:52 PM   Specimen: STOOL  Result Value Ref [facility]   C Diff antigen NEGATIVE NEGATIVE Final   C Diff toxin NEGATIVE NEGATIVE Final   C Diff interpretation No C. difficile detected.  Final    Comment: Performed at [facility], [address] [place] [address]  Surgical pcr screen     Status: Abnormal   Collection Time: 06/25/19  4:16 AM   Specimen: Nasal Mucosa; Nasal Swab  Result Value Ref Range Status   MRSA, PCR NEGATIVE NEGATIVE Final   Staphylococcus aureus POSITIVE (A) NEGATIVE Final    Comment: (NOTE) The Xpert SA Assay (FDA approved for NASAL specimens in patients 83 years of age and older), is one component of a comprehensive surveillance program. It is not intended to diagnose infection nor to guide or monitor treatment. Performed at [facility], [address] [place], [place] [address]   Aerobic/Anaerobic Culture (surgical/deep wound)     Status: None (Preliminary result)   Collection Time: 06/28/19 11:43 AM   Specimen: Abscess; Bile  Result Value Ref Range Status   Specimen Description ABSCESS GALL BLADDER  Final   Special Requests NONE  Final   Gram Stain   Final    ABUNDANT WBC PRESENT, PREDOMINANTLY PMN FEW GRAM NEGATIVE RODS RARE GRAM POSITIVE RODS Performed at [facility], [address] [place], [place] [address]    Culture   Final    FEW ESCHERICHIA COLI Confirmed Extended Spectrum Beta-Lactamase Producer (ESBL).  In bloodstream infections from ESBL organisms, carbapenems are preferred over piperacillin/tazobactam. They are shown to have a lower risk of mortality. NO ANAEROBES ISOLATED; CULTURE IN PROGRESS FOR 5 DAYS    Report Status PENDING  Incomplete   Organism ID, Bacteria ESCHERICHIA COLI  Final      Susceptibility   Escherichia coli - MIC*    AMPICILLIN >=32 RESISTANT Resistant     CEFAZOLIN >=64 RESISTANT Resistant     CEFEPIME RESISTANT Resistant     CEFTAZIDIME RESISTANT Resistant     CEFTRIAXONE >=64 RESISTANT Resistant     CIPROFLOXACIN <=0.25 SENSITIVE Sensitive     GENTAMICIN <=1 SENSITIVE Sensitive     IMIPENEM <=0.25 SENSITIVE Sensitive     TRIMETH/SULFA <=20 SENSITIVE Sensitive     AMPICILLIN/SULBACTAM 8 SENSITIVE Sensitive     PIP/TAZO <=4 SENSITIVE Sensitive      Extended ESBL POSITIVE Resistant     * FEW ESCHERICHIA COLI     Labs: BNP (last 3 results) Recent Labs  06/24/19 0603  BNP 414.2*   Basic Metabolic Panel: Recent Labs  Lab 06/26/19 0402 06/27/19 0341 06/28/19 0405 06/29/19 0346 06/30/19 0730  NA 137 138 138 139 137  K 3.5 3.7 3.5 3.5 3.6  CL 107 109 107 109 107  CO2 19* 21* 18* 18* 21*  GLUCOSE 126* 145* 191* 169* 178*  BUN CREATININE 1.19* 1.15* 1.14* 1.24* 1.20*  CALCIUM 7.7* 7.7* 7.4* 7.4* 7.6*   Liver Function Tests: Recent Labs  Lab 06/26/19 0402 06/27/19 0341 06/28/19 0405 06/29/19 0346 06/30/19 0730  AST 13* 13* 13* 16 12*  ALT ALKPHOS 46 47 56 49 50  BILITOT 1.0 0.5 0.6 0.5 0.4  PROT 5.7* 6.0* 5.6* 5.4* 5.8*  ALBUMIN 2.1* 2.2* 2.1* 2.1* 2.1*   Recent Labs  Lab 06/23/19 1728  LIPASE 25   No results for input(s): AMMONIA in the last 168 hours. CBC: Recent Labs  Lab 06/26/19 0402 06/27/19 0341 06/28/19 0405 06/29/19 0346 06/30/19 0730  WBC 8.9 12.1* 11.4* 10.3 12.9*  HGB 9.3* 10.1* 8.8* 9.4* 10.2*  HCT 30.0* 32.6* 28.1* 30.7* 31.8*  MCV 99.0 97.9 97.9 97.2 93.8  PLT 131* 133* 119* 121* 137*   Cardiac Enzymes: No results for input(s): CKTOTAL, CKMB, CKMBINDEX, TROPONINI in the last 168 hours. BNP: Invalid input(s): POCBNP CBG: Recent Labs  Lab 06/29/19 1255 06/29/19 1654 06/29/19 2118 06/30/19 0740 06/30/19 1159  GLUCAP 236* 171* 317* 170* 212*   D-Dimer No results for input(s): DDIMER in the last 72 hours. Hgb A1c No results for input(s): HGBA1C in the last 72 hours. Lipid Profile No results for input(s): CHOL, HDL, LDLCALC, TRIG, CHOLHDL, LDLDIRECT in the last 72 hours. Thyroid function studies No results for input(s): TSH, T4TOTAL, T3FREE, THYROIDAB in the last 72 hours.  Invalid input(s): FREET3 Anemia work up No results for input(s): VITAMINB12, FOLATE, FERRITIN, TIBC, IRON, RETICCTPCT in the last 72 hours. Urinalysis    Component Value  Date/Time   COLORURINE AMBER (A) 06/23/2019 1941   APPEARANCEUR TURBID (A) 06/23/2019 1941   LABSPEC 1.017 06/23/2019 1941   PHURINE 5.0 06/23/2019 1941   GLUCOSEU 150 (A) 06/23/2019 1941   HGBUR SMALL (A) 06/23/2019 1941   BILIRUBINUR NEGATIVE 06/23/2019 1941   KETONESUR 5 (A) 06/23/2019 1941   PROTEINUR 100 (A) 06/23/2019 1941   UROBILINOGEN 0.2 07/31/2014 0235   NITRITE NEGATIVE 06/23/2019 1941   LEUKOCYTESUR LARGE (A) 06/23/2019 1941   Sepsis Labs Invalid input(s): PROCALCITONIN,  WBC,  LACTICIDVEN  FURTHER DISCHARGE INSTRUCTIONS:   Get Medicines reviewed and adjusted: Please take all your medications with you for your next visit with your Primary MD   Laboratory/radiological data: Please request your Primary MD to go over all hospital tests and procedure/radiological results at the follow up, please ask your Primary MD to get all Hospital records sent to his/her office.   In some cases, they will be blood work, cultures and biopsy results pending at the time of your discharge. Please request that your primary care M.D. goes through all the records of your hospital data and follows up on these results.   Also Note the following: If you experience worsening of your admission symptoms, develop shortness of breath, life threatening emergency, suicidal or homicidal thoughts you must seek medical attention immediately by calling 911 or calling your MD immediately  if symptoms less severe.   You must read complete instructions/literature along with all the possible adverse reactions/side effects for  all the Medicines you take and that have been prescribed to you. Take any new Medicines after you have completely understood and accpet all the possible adverse reactions/side effects.    Do not drive when taking Pain medications or sleeping medications (Benzodaizepines)   Do not take more than prescribed Pain, Sleep and Anxiety Medications. It is not advisable to combine anxiety,sleep and  pain medications without talking with your primary care practitioner   Special Instructions: If you have smoked or chewed Tobacco  in the last 2 yrs please stop smoking, stop any regular Alcohol  and or any Recreational drug use.   Wear Seat belts while driving.   Please note: You were cared for by a hospitalist during your hospital stay. Once you are discharged, your primary care physician will handle any further medical issues. Please note that NO REFILLS for any discharge medications will be authorized once you are discharged, as it is imperative that you return to your primary care physician (or establish a relationship with a primary care physician if you do not have one) for your post hospital discharge needs so that they can reassess your need for medications and monitor your lab values.  Time coordinating discharge: 35 minutes  SIGNED:  Pamella Pert, MD, PhD 06/30/2019, 1:28 PM

## 2019-06-30 NOTE — Care Management Important Message (Signed)
Important Message  Patient Details  Name: Margaret [name] MRN: [id number] Date of Birth: 1934-10-06   Medicare Important Message Given:  Yes     Memory Argue 06/30/2019, 3:34 PM

## 2019-06-30 NOTE — Progress Notes (Signed)
Discharged home today.patient's son driving her home. Discharged instructions given to daughter Henna. Personal belongings given to son. Advised to pick up medications from pharmacy of choice. Prescriptions given to daughter. Verbalized understanding of instructions

## 2019-07-03 LAB — AEROBIC/ANAEROBIC CULTURE W GRAM STAIN (SURGICAL/DEEP WOUND)

## 2019-07-04 ENCOUNTER — Other Ambulatory Visit: Payer: Self-pay | Admitting: Surgery

## 2019-07-04 DIAGNOSIS — K810 Acute cholecystitis: Secondary | ICD-10-CM

## 2019-07-28 ENCOUNTER — Ambulatory Visit (HOSPITAL_COMMUNITY)
Admission: RE | Admit: 2019-07-28 | Discharge: 2019-07-28 | Disposition: A | Discharge status: Home / Self Care | Payer: Private Health Insurance - Indemnity | Source: Ambulatory Visit | Attending: Surgery | Admitting: Surgery

## 2019-07-28 ENCOUNTER — Other Ambulatory Visit (HOSPITAL_COMMUNITY): Payer: Self-pay | Admitting: Surgery

## 2019-07-28 ENCOUNTER — Encounter (HOSPITAL_COMMUNITY): Payer: Self-pay | Admitting: Interventional Radiology

## 2019-07-28 ENCOUNTER — Other Ambulatory Visit: Payer: Self-pay

## 2019-07-28 DIAGNOSIS — K810 Acute cholecystitis: Secondary | ICD-10-CM

## 2019-07-28 DIAGNOSIS — K8020 Calculus of gallbladder without cholecystitis without obstruction: Secondary | ICD-10-CM | POA: Diagnosis not present

## 2019-07-28 DIAGNOSIS — Z4803 Encounter for change or removal of drains: Secondary | ICD-10-CM | POA: Insufficient documentation

## 2019-07-28 HISTORY — PX: IR EXCHANGE BILIARY DRAIN: IMG6046

## 2019-07-28 MED ORDER — IOHEXOL 300 MG/ML  SOLN
50.0000 mL | Freq: Once | INTRAMUSCULAR | Status: AC | PRN
  Administered 2019-07-28: 15 mL

## 2019-07-28 MED ORDER — LIDOCAINE HCL 1 % IJ SOLN
INTRAMUSCULAR | Status: AC
  Filled 2019-07-28: qty 20

## 2019-07-29 ENCOUNTER — Encounter (HOSPITAL_COMMUNITY): Payer: Self-pay

## 2019-07-29 ENCOUNTER — Other Ambulatory Visit (HOSPITAL_COMMUNITY): Payer: Self-pay | Admitting: Surgery

## 2019-07-29 DIAGNOSIS — K810 Acute cholecystitis: Secondary | ICD-10-CM

## 2019-08-19 ENCOUNTER — Other Ambulatory Visit: Payer: Self-pay

## 2019-11-21 ENCOUNTER — Ambulatory Visit: Payer: Private Health Insurance - Indemnity

## 2019-11-26 ENCOUNTER — Ambulatory Visit: Payer: Private Health Insurance - Indemnity

## 2019-11-29 ENCOUNTER — Ambulatory Visit: Payer: Private Health Insurance - Indemnity | Attending: Internal Medicine

## 2019-11-29 DIAGNOSIS — Z23 Encounter for immunization: Secondary | ICD-10-CM

## 2019-11-29 NOTE — Progress Notes (Signed)
   Covid-19 Vaccination Clinic  Name:  Pinkey Mcjunkin    MRN: [id number] DOB: 1934/08/19  11/29/2019  [name] was observed post Covid-19 immunization for 30 minutes based on pre-vaccination screening without incidence. She was provided with Vaccine Information Sheet and instruction to access the V-Safe system.   [name] was instructed to call 911 with any severe reactions post vaccine: Marland Kitchen Difficulty breathing  . Swelling of your face and throat  . A fast heartbeat  . A bad rash all over your body  . Dizziness and weakness    Immunizations Administered    Name Date Dose VIS Date Route   Pfizer COVID-19 Vaccine 11/29/2019  2:26 AM 0.3 mL 10/03/2019 Intramuscular   Manufacturer: [employer], [employer]   Lot: [id number]   NDC: [address]-2

## 2019-12-24 ENCOUNTER — Ambulatory Visit: Payer: Private Health Insurance - Indemnity | Attending: Internal Medicine

## 2019-12-24 DIAGNOSIS — Z23 Encounter for immunization: Secondary | ICD-10-CM | POA: Insufficient documentation

## 2019-12-24 NOTE — Progress Notes (Signed)
   Covid-19 Vaccination Clinic  Name:  Adalaya Irion    MRN: [id number] DOB: 1934-10-20  12/24/2019  Ms. Muratalla was observed post Covid-19 immunization for 15 minutes without incident. She was provided with Vaccine Information Sheet and instruction to access the V-Safe system.   Ms. Poe was instructed to call 911 with any severe reactions post vaccine: Marland Kitchen Difficulty breathing  . Swelling of face and throat  . A fast heartbeat  . A bad rash all over body  . Dizziness and weakness   Immunizations Administered    Name Date Dose VIS Date Route   Pfizer COVID-19 Vaccine 12/24/2019  3:09 PM 0.3 mL 10/03/2019 Intramuscular   Manufacturer: [employer], [employer]   Lot: [id number]   NDC: [address]-0

## 2021-07-04 ENCOUNTER — Other Ambulatory Visit: Payer: Self-pay

## 2021-07-04 DIAGNOSIS — I739 Peripheral vascular disease, unspecified: Secondary | ICD-10-CM

## 2021-07-11 NOTE — Progress Notes (Signed)
[facility] [place]  ASSESSMENT / PLAN: Margaret [name] is a 87 y.o. female with atherosclerosis of native arteries of bilateral lower extremities causing  atypical symptoms .  Patient counseled patients with asymptomatic peripheral arterial disease or claudication have a 1-2% risk of developing chronic limb threatening ischemia, but a 15-30% risk of mortality in the next 5 years. Intervention should only be considered for medically optimized patients with disabling symptoms.   Recommend the following which can slow the progression of atherosclerosis and reduce the risk of major adverse cardiac / limb events:  Complete cessation from all tobacco products. Blood glucose control with goal A1c < 7%. Blood pressure control with goal blood pressure < 140/90 mmHg. Lipid reduction therapy with goal LDL-C <100 mg/dL (<15 if symptomatic from PAD).  Aspirin 81mg  PO QD.  Atorvastatin 40-80mg  PO QD (or other "high intensity" statin therapy).  Her symptoms are atypical, and I do not think caused by peripheral arterial disease.  Would recommend avoiding intervention if at all possible.  Would only pursue angiogram if should she develop limb threatening ischemia.  She is debilitated and would benefit from physical therapy.  Follow-up with [place] in 6 months for surveillance ABI.  CHIEF COMPLAINT: Leg pain  HISTORY OF PRESENT ILLNESS: Margaret [name] is a 87 y.o. female who presents for evaluation of leg pain.  The patient does not speak much English, her son is with her and acts as a Nurse, learning disability.  The patient reports pain about the left knee which is episodic.  She also reports right pain in the lateral thigh pointing to her iliotibial band.  She has paresthesias and burning discomfort in her feet which bother her, especially at night.  She has symptomatic swelling of her bilateral lower extremities.  She reports shiny discoloration of the skin over her feet and ankles.  Walk far or fast enough to  claudicate.  She does not report symptoms typical of ischemic rest pain of the foot.  She has no ulceration about her feet.  Past Medical History:  Diagnosis Date   Acute on chronic renal failure (HCC) 07/31/2014   Acute pulmonary edema with congestive heart failure (HCC) 07/17/2014   Acute renal insufficiency 07/19/2014   Anemia    ARF (acute renal failure) (HCC) 07/31/2014   Atherosclerotic peripheral vascular disease with intermittent claudication (HCC) 04/2014   LEA Dopplers: RABI 1.02/ LABI: 0.61 (L SFA, Pop & DP blunted wave forms) -- correlates with L leg claudication   Atrophic vaginitis 04/2013   CAD (coronary artery disease)    a. prior PCI LAD 2005. b. CABG 2011.   Chronic diastolic CHF (congestive heart failure) (HCC)    CKD (chronic kidney disease), stage III (HCC)    Hx of CABG    Hx of echocardiogram 05/2014   EF 50-55%, inf-lat HK, Gr 2 DD, very mild AS (mean 10 mmHg), mild MR, PASP 36 mmHg; calcified density in LA (suggest TEE)   Hyperlipidemia with target LDL less than 70    Mitral regurgitation    PAD (peripheral artery disease) (HCC)    Pulmonary hypertension (HCC)    RESPIRATORY ARREST assoc w MYOVIEW    Thyroid disease    Tricuspid regurgitation    Type 2 diabetes mellitus (HCC)    Vitamin B12 deficiency     Past Surgical History:  Procedure Laterality Date   CARDIAC CATHETERIZATION  2005   LAD stent occluded   CARDIAC CATHETERIZATION  03/2010    Ostial left main 60,  OM1 70, circumflex 50-60, mid LAD stent occluded, mid RCA 30-40, RV marginal 90, EF 45-50%. >>> CABG   CORONARY ARTERY BYPASS GRAFT  03/2010   LIMA-LAD, SVG-D1, seqSVG-OM1-OM3   IR EXCHANGE BILIARY DRAIN  07/28/2019   IR PERC CHOLECYSTOSTOMY  06/28/2019   LEFT HEART CATHETERIZATION WITH CORONARY/GRAFT ANGIOGRAM N/A 07/22/2014   Procedure: LEFT HEART CATHETERIZATION WITH Isabel Caprice;  Surgeon: Iran Ouch, MD;  Location: [facility]ATH [facility];  Service: Cardiovascular;  Laterality: N/A;   TOTAL  KNEE ARTHROPLASTY      Family History  Problem Relation Age of Onset   Diabetes Father    Ulcers Other    Diabetes Other    Arthritis Other    Diabetes Other    Heart attack Neg Hx     Social History   Socioeconomic History   Marital status: Single    Spouse name: Not on file   Number of children: Not on file   Years of education: Not on file   Highest education level: Not on file  Occupational History   Occupation: Retired  Tobacco Use   Smoking status: Never   Smokeless tobacco: Never  Vaping Use   Vaping Use: Never used  Substance and Sexual Activity   Alcohol use: No   Drug use: No   Sexual activity: Not on file  Other Topics Concern   Not on file  Social History Narrative   Son is POA.   Does not smoke - never smoked.   Spends 1/2 year in [place] & 1/2 year in [place].   Social Determinants of Health   Financial Resource Strain: Not on file  Food Insecurity: Not on file  Transportation Needs: Not on file  Physical Activity: Not on file  Stress: Not on file  Social Connections: Not on file  Intimate Partner Violence: Not on file    Allergies  Allergen Reactions   Myoview [Technetium-30m] Shortness Of Breath   Penicillins Other (See Comments)    Unknown allergic reaction     Current Outpatient Medications  Medication Sig Dispense Refill   carvedilol (COREG) 12.5 MG tablet Take 0.5 tablets (6.25 mg total) by mouth 2 (two) times daily with a meal. 30 tablet 0   clopidogrel (PLAVIX) 75 MG tablet Take 75 mg by mouth daily.     Cyanocobalamin (VITAMIN B-12) 2500 MCG TABS Take 5,000 mcg by mouth daily. 30 tablet 0   furosemide (LASIX) 40 MG tablet Take 40 mg by mouth daily as needed for fluid.      gabapentin (NEURONTIN) 100 MG capsule 100 mg, 3 times a day as needed for nerve pain (Patient taking differently: Take 100 mg by mouth 3 (three) times daily as needed (nerve pain).) 90 capsule 3   glipiZIDE-metformin (METAGLIP) 5-500 MG per tablet Take 1 tablet by  mouth 2 (two) times daily before a meal.     levothyroxine (SYNTHROID) 100 MCG tablet Take 75-100 mcg by mouth See admin instructions. Taking 1 tablet ( ) Monday- Friday and on Saturday, Sunday     linagliptin (TRADJENTA) 5 MG TABS tablet Take 5 mg by mouth every morning.     Multiple Vitamin (MULTIVITAMIN) tablet Take 1 tablet by mouth daily.     rosuvastatin (CRESTOR) 10 MG tablet Take 1 tablet (10 mg total) by mouth daily after supper. Rosavel from [place] (Patient taking differently: Take 10 mg by mouth daily after supper. (Rosavel from [place])) 30 tablet 6   No current facility-administered medications for this visit.  REVIEW OF SYSTEMS:  [X]  denotes positive finding, [ ]  denotes negative finding Cardiac  Comments:  Chest pain or chest pressure:    Shortness of breath upon exertion:    Short of breath when lying flat:    Irregular heart rhythm:        Vascular    Pain in calf, thigh, or hip brought on by ambulation:    Pain in feet at night that wakes you up from your sleep:     Blood clot in your veins:    Leg swelling:         Pulmonary    Oxygen at home:    Productive cough:     Wheezing:         Neurologic    Sudden weakness in arms or legs:     Sudden numbness in arms or legs:     Sudden onset of difficulty speaking or slurred speech:    Temporary loss of vision in one eye:     Problems with dizziness:         Gastrointestinal    Blood in stool:     Vomited blood:         Genitourinary    Burning when urinating:     Blood in urine:        Psychiatric    Major depression:         Hematologic    Bleeding problems:    Problems with blood clotting too easily:        Skin    Rashes or ulcers:        Constitutional    Fever or chills:      PHYSICAL EXAM Vitals:   07/12/21 1307  BP: 115/78  Pulse: 98  Resp: 18  Temp: 97.9 F (36.6 C)  SpO2: 95%  Weight: 134 lb (60.8 kg)  Height: 5' (1.524 m)    Constitutional: Elderly, chronically ill  appearing. no distress. Appears marginally nourished.  Neurologic: Ambulates with difficulty with a walker. CN intact. no focal findings. no sensory loss. Psychiatric:  Mood and affect symmetric and appropriate. Eyes:  No icterus. No conjunctival pallor. Ears, nose, throat:  mucous membranes moist. Midline trachea.  Cardiac: regular rate and rhythm.  Respiratory:  unlabored. Abdominal:  soft, non-tender, non-distended.  Peripheral vascular: no palpable pedal pulses. Feet are warm. Extremity: 2+ pitting edema from ankles to knees bilaterally. no cyanosis. no pallor.  Skin: no gangrene. no ulceration.  Lymphatic: no Stemmer's sign. no palpable lymphadenopathy.  PERTINENT LABORATORY AND RADIOLOGIC DATA  Most recent CBC CBC Latest Ref Rng & Units 06/30/2019 06/29/2019 06/28/2019  WBC 4.0 - 10.5 K/uL 12.9(H) 10.3 11.4(H)  Hemoglobin 12.0 - 15.0 g/dL 10.2(L) 9.4(L) 8.8(L)  Hematocrit 36.0 - 46.0 % 31.8(L) 30.7(L) 28.1(L)  Platelets 150 - 400 K/uL 137(L) 121(L) 119(L)     Most recent CMP CMP Latest Ref Rng & Units 06/30/2019 06/29/2019 06/28/2019  Glucose 70 - 99 mg/dL 08/29/2019) 08/28/2019) 162(O)  BUN 8 - 23 mg/dL 20 21 18   Creatinine 0.44 - 1.00 mg/dL 469(F) 072(U) )  Sodium 135 - 145 mmol/L 137 139 138  Potassium 3.5 - 5.1 mmol/L 3.6 3.5 3.5  Chloride 98 - 111 mmol/L 107 109 107  CO2 22 - 32 mmol/L 21(L) 18(L) 18(L)  Calcium 8.9 - 10.3 mg/dL 7.6(L) 7.4(L) 7.4(L)  Total Protein 6.5 - 8.1 g/dL 5.75(Y) 5.18(Z) 5.6(L)  Total Bilirubin 0.3 - 1.2 mg/dL 0.4 0.5 0.6  Alkaline Phos 38 - 126  U/L 50 49 56  AST 15 - 41 U/L 12(L) 16 13(L)  ALT 0 - 44 U/L 8 9 10     Renal function CrCl cannot be calculated (Patient's most recent lab result is older than the maximum 21 days allowed.).  Hgb A1c MFr Bld (%)  Date Value  06/24/2019 7.1 (H)    LDL Cholesterol  Date Value Ref Range Status  06/26/2019 31 0 - 99 mg/dL Final    Comment:           Total Cholesterol/HDL:CHD Risk Coronary Heart Disease Risk  Table                     Men   Women  1/2 Average Risk   3.4   3.3  Average Risk       5.0   4.4  2 X Average Risk   9.6   7.1  3 X Average Risk  23.4   11.0        Use the calculated Patient Ratio above and the CHD Risk Table to determine the patient's CHD Risk.        ATP III CLASSIFICATION (LDL):  <100     mg/dL   Optimal  08/26/2019  mg/dL   Near or Above                    Optimal  130-159  mg/dL   Borderline  573-220  mg/dL   High  254-270     mg/dL   Very High Performed at [facility], [address] [place], [place]       LOWER EXTREMITY DOPPLER STUDY   Patient Name:  Margaret [name]  Date of Exam:   07/12/2021  Medical Rec #: 07/14/2021   Accession #:    [id number]  Date of Birth: 02-01-1934    Patient Gender: F  Patient Age:   3 years  Exam Location:  [facility]  Procedure:      VAS Rudene Anda ABI WITH/WO TBI  Referring Phys: [place]    ---------------------------------------------------------------------------  -----     Indications: Peripheral artery disease. Patient complains of constant left  leg               pain   High Risk Factors: Diabetes, no history of smoking, coronary artery  disease.     Comparison Study: No recent exam. Exam of 08/06/2014 right ABI 1.1 Left  0.88   Performing Technologist: 08/08/2014 RVT      Examination Guidelines: A complete evaluation includes at minimum, Doppler  waveform signals and systolic blood pressure reading at the level of  bilateral  brachial, anterior tibial, and posterior tibial arteries, when vessel  segments  are accessible. Bilateral testing is considered an integral part of a  complete  examination. Photoelectric Plethysmograph (PPG) waveforms and toe systolic  pressure readings are included as required and additional duplex testing  as  needed. Limited examinations for reoccurring indications may be performed  as  noted.      ABI Findings:   +---------+------------------+-----+----------+--------+  Right    Rt Pressure (mmHg)IndexWaveform  Comment   +---------+------------------+-----+----------+--------+  Brachial 137                                        +---------+------------------+-----+----------+--------+  PTA      110  0.80 monophasic          +---------+------------------+-----+----------+--------+  DP       >254              1.85 monophasic          +---------+------------------+-----+----------+--------+  Great Toe42                0.31 Abnormal            +---------+------------------+-----+----------+--------+   +---------+------------------+-----+----------+-------+  Left     Lt Pressure (mmHg)IndexWaveform  Comment  +---------+------------------+-----+----------+-------+  Brachial 135                                       +---------+------------------+-----+----------+-------+  PTA      >254              1.85 monophasic         +---------+------------------+-----+----------+-------+  DP       117               0.85 monophasic         +---------+------------------+-----+----------+-------+  Great Toe47                0.34                    +---------+------------------+-----+----------+-------+   +-------+-----------+-----------+------------+------------+  ABI/TBIToday's ABIToday's TBIPrevious ABIPrevious TBI  +-------+-----------+-----------+------------+------------+  Right  [place]         0.31       1.1                       +-------+-----------+-----------+------------+------------+  Left   [place]         0.34       0.88                      +-------+-----------+-----------+------------+------------+        Summary:  Right: Resting right ankle-brachial index indicates noncompressible right  lower extremity arteries. The right toe-brachial index is abnormal.   Left: Resting left ankle-brachial index indicates  noncompressible left  lower extremity arteries. The left toe-brachial index is abnormal.       *See table(s) above for measurements and observations  Rande Brunt. Lenell Antu, MD [facility] Phone Number: [phone] 07/12/2021 1:30 PM  Total time spent on preparing this encounter including chart review, data review, collecting history, examining the patient, coordinating care for this new patient, 60 minutes.  Portions of this report may have been transcribed using voice recognition software.  Every effort has been made to ensure accuracy; however, inadvertent computerized transcription errors may still be present.

## 2021-07-12 ENCOUNTER — Encounter: Payer: Self-pay | Admitting: Vascular Surgery

## 2021-07-12 ENCOUNTER — Ambulatory Visit (INDEPENDENT_AMBULATORY_CARE_PROVIDER_SITE_OTHER): Payer: 59 | Admitting: Vascular Surgery

## 2021-07-12 ENCOUNTER — Ambulatory Visit (HOSPITAL_COMMUNITY)
Admission: RE | Admit: 2021-07-12 | Discharge: 2021-07-12 | Disposition: A | Discharge status: Home / Self Care | Payer: Medicare Other | Source: Ambulatory Visit | Attending: Vascular Surgery | Admitting: Vascular Surgery

## 2021-07-12 ENCOUNTER — Other Ambulatory Visit: Payer: Self-pay

## 2021-07-12 VITALS — BP 115/78 | HR 98 | Temp 97.9°F | Resp 18 | Ht 60.0 in | Wt 134.0 lb

## 2021-07-12 DIAGNOSIS — I739 Peripheral vascular disease, unspecified: Secondary | ICD-10-CM

## 2021-07-12 DIAGNOSIS — I2510 Atherosclerotic heart disease of native coronary artery without angina pectoris: Secondary | ICD-10-CM | POA: Diagnosis not present

## 2021-07-12 DIAGNOSIS — E1151 Type 2 diabetes mellitus with diabetic peripheral angiopathy without gangrene: Secondary | ICD-10-CM | POA: Insufficient documentation

## 2021-07-14 ENCOUNTER — Other Ambulatory Visit: Payer: Self-pay

## 2021-07-14 DIAGNOSIS — I739 Peripheral vascular disease, unspecified: Secondary | ICD-10-CM

## 2021-08-23 NOTE — Progress Notes (Addendum)
Cardiology Office Note   Date:  08/24/2021   ID:  Margaret [name], Margaret [name] 01-Dec-1933, MRN [id number]  PCP:  Vernie Shanks, MD    No chief complaint on file.  CAD  Wt Readings from Last 3 Encounters:  08/24/21 124 lb (56.2 kg)  07/12/21 134 lb (60.8 kg)  06/30/19 134 lb 7.7 oz (61 kg)       History of Present Illness: Margaret [name] is a 87 y.o. female  who has known  history of CAD status post CABG in 2011, diabetes, HL, hypothyroidism.  She has had congestive heart failure and renal failure in the past.  SHe has been hospitalized in [place] as well back in 02/2014 for CHF.    In 2015: "She was admitted 09/25 thru 10/02 after she held her Lasix and BB for a Myoview. She had a Lexiscan injection, developed diarrhea, acute CHF requiring BiPAP and was hypoxic.  She was diuresed and then a R/L heart cath was performed. She had ischemic MR due to occlusion of SVG-OM, medical therapy. TEE showed mod TR and mod pulm HTN, but no ruptured chordae. She was started on an ACE inhibitor. She went to her family MD for f/u visit and blood work was performed which showed significantly worsened renal function. She was taken off Lasix and metformin. When her renal function did not improve, she was instructed to come to the ER, and was admitted 10/09. Renal function improved gradually and was discharged home.  She has seen Dr. Fletcher Anon for PAD as well.    2020 echo showed: "The left ventricle has normal systolic function, with an ejection  fraction of 55-60%. The cavity size was normal. Left ventricular diastolic  Doppler parameters are consistent with pseudonormalization. Elevated left  atrial and left ventricular  end-diastolic pressures.   2. The right ventricle has normal systolic function. The cavity was  normal. There is no increase in right ventricular wall thickness. Right  ventricular systolic pressure is moderately elevated with an estimated  pressure of 55.6 mmHg.   3. Left atrial size was mildly  dilated.   4. Right atrial size was mildly dilated.   5. The mitral valve is degenerative.   6. Tricuspid valve regurgitation is moderate.   7. The aortic valve is tricuspid. Moderate thickening of the aortic  valve. Moderate calcification of the aortic valve. Mild-moderate stenosis  of the aortic valve.   8. The aorta is normal unless otherwise noted. "  I have not seen her in many years. SHe still lives with her elderly husband.   She has been increasing salt intake.  She could not taste her food and so was adding salt to food.  She also eats prepackaged [place] snacks.  Daughter was not aware that these are high in sodium.  Denies : Chest pain. Dizziness. Nitroglycerin use. Orthopnea. Palpitations. Paroxysmal nocturnal dyspnea.  Syncope.    She reports increased shortness of breath along with the lower extremity swelling.   Past Medical History:  Diagnosis Date   Acute on chronic renal failure ([place]) 07/31/2014   Acute pulmonary edema with congestive heart failure ([place]) 07/17/2014   Acute renal insufficiency 07/19/2014   Anemia    ARF (acute renal failure) ([place]) 07/31/2014   Atherosclerotic peripheral vascular disease with intermittent claudication ([place]) 04/2014   LEA Dopplers: RABI 1.02/ LABI: 0.61 (L SFA, Pop & DP blunted wave forms) -- correlates with L leg claudication   Atrophic vaginitis 04/2013   CAD (coronary artery disease)  a. prior PCI LAD 2005. b. CABG 2011.   Chronic diastolic CHF (congestive heart failure) (HCC)    CKD (chronic kidney disease), stage III (HCC)    Hx of CABG    Hx of echocardiogram 05/2014   EF 50-55%, inf-lat HK, Gr 2 DD, very mild AS (mean 10 mmHg), mild MR, PASP 36 mmHg; calcified density in LA (suggest TEE)   Hyperlipidemia with target LDL less than 70    Mitral regurgitation    PAD (peripheral artery disease) (HCC)    Pulmonary hypertension (HCC)    RESPIRATORY ARREST assoc w MYOVIEW    Thyroid disease    Tricuspid regurgitation    Type 2  diabetes mellitus ([place])    Vitamin B12 deficiency     Past Surgical History:  Procedure Laterality Date   CARDIAC CATHETERIZATION  2005   LAD stent occluded   CARDIAC CATHETERIZATION  03/2010    Ostial left main 60, OM1 70, circumflex 50-60, mid LAD stent occluded, mid RCA 30-40, RV marginal 90, EF 45-50%. >>> CABG   CORONARY ARTERY BYPASS GRAFT  03/2010   LIMA-LAD, SVG-D1, seqSVG-OM1-OM3   IR EXCHANGE BILIARY DRAIN  07/28/2019   IR PERC CHOLECYSTOSTOMY  06/28/2019   LEFT HEART CATHETERIZATION WITH CORONARY/GRAFT ANGIOGRAM N/A 07/22/2014   Procedure: LEFT HEART CATHETERIZATION WITH Beatrix Fetters;  Surgeon: Wellington Hampshire, MD;  Location: [facility];  Service: Cardiovascular;  Laterality: N/A;   TOTAL KNEE ARTHROPLASTY       Current Outpatient Medications  Medication Sig Dispense Refill   carvedilol (COREG) 12.5 MG tablet Take 0.5 tablets (6.25 mg total) by mouth 2 (two) times daily with a meal. 30 tablet 0   clopidogrel (PLAVIX) 75 MG tablet Take 75 mg by mouth daily.     Cyanocobalamin (VITAMIN B-12) 2500 MCG TABS Take 5,000 mcg by mouth daily. 30 tablet 0   furosemide (LASIX) 40 MG tablet Take 40 mg by mouth daily as needed for fluid.      gabapentin (NEURONTIN) 100 MG capsule 100 mg, 3 times a day as needed for nerve pain (Patient taking differently: Take 200 mg by mouth 3 (three) times daily as needed (nerve pain).) 90 capsule 3   glipiZIDE-metformin (METAGLIP) 5-500 MG per tablet Take 1 tablet by mouth 2 (two) times daily before a meal.     levothyroxine (SYNTHROID) 100 MCG tablet Take 75-100 mcg by mouth See admin instructions. Taking 1 tablet (157mcg) Monday- Friday and 20mcg on Saturday, Sunday     linagliptin (TRADJENTA) 5 MG TABS tablet Take 5 mg by mouth every morning.     Multiple Vitamin (MULTIVITAMIN) tablet Take 1 tablet by mouth daily.     rosuvastatin (CRESTOR) 10 MG tablet Take 1 tablet (10 mg total) by mouth daily after supper. Rosavel from [place] (Patient  taking differently: Take 10 mg by mouth daily after supper. (Rosavel from [place])) 30 tablet 6   No current facility-administered medications for this visit.    Allergies:   Myoview [technetium-69m] and Penicillins    Social History:  The patient  reports that she has never smoked. She has never used smokeless tobacco. She reports that she does not drink alcohol and does not use drugs.   Family History:  The patient's think it.  family history includes Arthritis in an other family member; Diabetes in her father and other family members; Ulcers in an other family member.    ROS:  Please see the history of present illness.   Otherwise, review of  systems are positive for increased LE swelling.   All other systems are reviewed and negative.    PHYSICAL EXAM: VS:  BP 108/70   Pulse (!) 102   Ht 4\' 10"  (1.473 m)   Wt 124 lb (56.2 kg)   SpO2 96%   BMI 25.92 kg/m  , BMI Body mass index is 25.92 kg/m. GEN: Well nourished, well developed, frail HEENT: normal Neck: no JVD, carotid bruits, or masses Cardiac: RRR; no murmurs, rubs, or gallops, Marked LE edema , R>L, no visible blisters Respiratory:  clear to auscultation bilaterally, normal work of breathing GI: soft, nontender, nondistended, + BS MS: no deformity or atrophy; right TKR scar Skin: warm and dry, no rash; no wounds on feet Neuro:  in a wheelchair Psych: euthymic mood, full affect   EKG:   The ekg ordered today demonstrates sinus tach, PRWP, nonspecific ST changes   Recent Labs: No results found for requested labs within last 8760 hours.   Lipid Panel    Component Value Date/Time   CHOL 71 06/26/2019 0402   TRIG 58 06/26/2019 0402   HDL 28 (L) 06/26/2019 0402   CHOLHDL 2.5 06/26/2019 0402   VLDL 12 06/26/2019 0402   LDLCALC 31 06/26/2019 0402     Other studies Reviewed: Additional studies/ records that were reviewed today with results demonstrating: Cr 1.16 in 05/2021.   ASSESSMENT AND PLAN:  CAD: No chest  pain on medical therapy.  Continue secondary prevention.  CHF/mitral regurgitation: Recent increased salt intake.  Worsening LE edema. Plan for repeat echo. Lasix 40 mg BID for 3 days.  Then can take 40 mg once a day. Check echo as well.  Elevate legs as much as possible.  PAD: No nonhealing sores.  DM: A1C 6.0. Whole food plant based diet. HTN: The current medical regimen is effective;  continue present plan and medications. Hyperlipidemia: The current medical regimen is effective;  continue present plan and medications.    Current medicines are reviewed at length with the patient today.  The patient concerns regarding her medicines were addressed.  The following changes have been made:  No change  Labs/ tests ordered today include:  No orders of the defined types were placed in this encounter.   Recommend 150 minutes/week of aerobic exercise Low fat, low carb, high fiber diet recommended  Disposition:   FU in for echo, then in 4-6 weeks; needs BMet at apt with Dr. 06/2021 in December   Signed, Ronika Kelson, MD  08/24/2021 11:13 AM    [facility] [address] [place], [place], [place]  [place] Phone: [phone]; Fax: [phone]

## 2021-08-24 ENCOUNTER — Ambulatory Visit (INDEPENDENT_AMBULATORY_CARE_PROVIDER_SITE_OTHER): Payer: 59 | Admitting: Interventional Cardiology

## 2021-08-24 ENCOUNTER — Other Ambulatory Visit: Payer: Self-pay

## 2021-08-24 ENCOUNTER — Encounter: Payer: Self-pay | Admitting: Interventional Cardiology

## 2021-08-24 VITALS — BP 108/70 | HR 102 | Ht <= 58 in | Wt 124.0 lb

## 2021-08-24 DIAGNOSIS — E1159 Type 2 diabetes mellitus with other circulatory complications: Secondary | ICD-10-CM | POA: Diagnosis not present

## 2021-08-24 DIAGNOSIS — I739 Peripheral vascular disease, unspecified: Secondary | ICD-10-CM | POA: Diagnosis not present

## 2021-08-24 DIAGNOSIS — E782 Mixed hyperlipidemia: Secondary | ICD-10-CM

## 2021-08-24 DIAGNOSIS — I25118 Atherosclerotic heart disease of native coronary artery with other forms of angina pectoris: Secondary | ICD-10-CM

## 2021-08-24 DIAGNOSIS — I5032 Chronic diastolic (congestive) heart failure: Secondary | ICD-10-CM

## 2021-08-24 DIAGNOSIS — I10 Essential (primary) hypertension: Secondary | ICD-10-CM

## 2021-08-24 DIAGNOSIS — R0602 Shortness of breath: Secondary | ICD-10-CM

## 2021-08-24 MED ORDER — FUROSEMIDE 40 MG PO TABS: ORAL_TABLET | ORAL | 0 refills | Status: AC

## 2021-08-24 NOTE — Patient Instructions (Addendum)
Medication Instructions:  1.Take furosemide (Lasix) to 40 mg by mouth twice a day for the next 3 days, then take 40 mg, one tablet by mouth once a day *If you need a refill on your cardiac medications before your next appointment, please call your pharmacy*   Lab Work: None If you have labs (blood work) drawn today and your tests are completely normal, you will receive your results only by: MyChart Message (if you have MyChart) OR A paper copy in the mail If you have any lab test that is abnormal or we need to change your treatment, we will call you to review the results.   Testing/Procedures: Your physician has requested that you have an echocardiogram. Echocardiography is a painless test that uses sound waves to create images of your heart. It provides your doctor with information about the size and shape of your heart and how well your heart's chambers and valves are working. This procedure takes approximately one hour. There are no restrictions for this procedure.    Follow-Up: At [facility], you and your health needs are our priority.  As part of our continuing mission to provide you with exceptional heart care, we have created designated Provider Care Teams.  These Care Teams include your primary Cardiologist (physician) and Advanced Practice Providers (APPs -  Physician Assistants and Nurse Practitioners) who all work together to provide you with the care you need, when you need it.   Your next appointment:   09/21/21 at 2:20 PM  The format for your next appointment:   In Person  Provider:    Tereso Newcomer, PA-C   Other Instructions Try to reduce your salt intake Be sure to elevate your legs

## 2021-09-21 ENCOUNTER — Telehealth: Payer: Self-pay | Admitting: Physician Assistant

## 2021-09-21 ENCOUNTER — Encounter: Payer: Self-pay | Admitting: Physician Assistant

## 2021-09-21 ENCOUNTER — Other Ambulatory Visit: Payer: Self-pay

## 2021-09-21 ENCOUNTER — Ambulatory Visit (INDEPENDENT_AMBULATORY_CARE_PROVIDER_SITE_OTHER): Payer: 59 | Admitting: Physician Assistant

## 2021-09-21 ENCOUNTER — Ambulatory Visit (HOSPITAL_COMMUNITY): Payer: Medicare Other | Attending: Interventional Cardiology

## 2021-09-21 VITALS — BP 102/64 | HR 72 | Ht <= 58 in | Wt 123.2 lb

## 2021-09-21 DIAGNOSIS — I25810 Atherosclerosis of coronary artery bypass graft(s) without angina pectoris: Secondary | ICD-10-CM | POA: Diagnosis not present

## 2021-09-21 DIAGNOSIS — R0602 Shortness of breath: Secondary | ICD-10-CM | POA: Insufficient documentation

## 2021-09-21 DIAGNOSIS — I340 Nonrheumatic mitral (valve) insufficiency: Secondary | ICD-10-CM

## 2021-09-21 DIAGNOSIS — N1831 Chronic kidney disease, stage 3a: Secondary | ICD-10-CM

## 2021-09-21 DIAGNOSIS — I5032 Chronic diastolic (congestive) heart failure: Secondary | ICD-10-CM | POA: Diagnosis not present

## 2021-09-21 DIAGNOSIS — I10 Essential (primary) hypertension: Secondary | ICD-10-CM

## 2021-09-21 DIAGNOSIS — I272 Pulmonary hypertension, unspecified: Secondary | ICD-10-CM

## 2021-09-21 DIAGNOSIS — I70219 Atherosclerosis of native arteries of extremities with intermittent claudication, unspecified extremity: Secondary | ICD-10-CM

## 2021-09-21 DIAGNOSIS — E785 Hyperlipidemia, unspecified: Secondary | ICD-10-CM

## 2021-09-21 LAB — ECHOCARDIOGRAM COMPLETE
AR max vel: 0.44 cm2
AV Area VTI: 0.36 cm2
AV Area mean vel: 0.38 cm2
AV Mean grad: 14.2 mmHg
AV Peak grad: 22.7 mmHg
Ao pk vel: 2.38 m/s
Area-P 1/2: 5.05 cm2
P 1/2 time: 217 msec
S' Lateral: 3.6 cm

## 2021-09-21 NOTE — Assessment & Plan Note (Signed)
She has been managed conservatively.  She is followed by vascular surgery.

## 2021-09-21 NOTE — Assessment & Plan Note (Signed)
History of CABG in 2011.  Cardiac catheterization in 2015 demonstrated an occluded vein graft to the OM.  Her LIMA to the LAD and vein graft to the diagonal were both patent.  She is doing well without anginal symptoms.  Continue clopidogrel 75 mg daily, rosuvastatin 10 mg daily, carvedilol 6.25 mg twice daily.

## 2021-09-21 NOTE — Patient Instructions (Signed)
Medication Instructions:  1.Be sure to take the furosemide (lasix) 40 mg one tablet by mouth daily *If you need a refill on your cardiac medications before your next appointment, please call your pharmacy*   Lab Work: BMET here today and then have home health repeat in one week If you have labs (blood work) drawn today and your tests are completely normal, you will receive your results only by: MyChart Message (if you have MyChart) OR A paper copy in the mail If you have any lab test that is abnormal or we need to change your treatment, we will call you to review the results.   Follow-Up: At [facility], you and your health needs are our priority.  As part of our continuing mission to provide you with exceptional heart care, we have created designated Provider Care Teams.  These Care Teams include your primary Cardiologist (physician) and Advanced Practice Providers (APPs -  Physician Assistants and Nurse Practitioners) who all work together to provide you with the care you need, when you need it.    Your next appointment:   1 month(s)  The format for your next appointment:   In Person  Provider:   Lance Muss, MD {

## 2021-09-21 NOTE — Progress Notes (Signed)
Cardiology Office Note:    Date:  09/21/2021   ID:  Margaret [name], DOB 1934-07-16, MRN [id number]  PCP:  Ileana Ladd, MD   [facility] Providers Cardiologist:  Lance Muss, MD     Referring MD: Ileana Ladd, MD   Chief Complaint:  Follow-up for CHF    Patient Profile:   Margaret [name] is a 87 y.o. female with:  Coronary artery disease S/p CABG in 2011 Ischemic mitral regurgitation (severe MR) Cath 9/15 (for decompensated HF) >> L-LAD, S-Dx patent; S-OM 100 [severe ischemic MR 2/2 occlusion of S-OM graft] TEE 10/15: mild to mod MR Echocardiogram 06/2019: mild MR (HFpEF) heart failure with preserved ejection fraction  Echocardiogram 06/2019: EF 55-60, Gr 2 DD Aortic stenosis Echocardiogram 06/2019: mean 15.4 mmHg, Vmax 254 cm/s, DI 0.31 Pulmonary hypertension  Echocardiogram 2020: RVSP 55.6 Peripheral arterial disease  Med Rx (has seen Dr. Kirke Corin w [facility] and Dr. Lenell Antu w [facility]) Diabetes mellitus Hyperlipidemia Hypothyroidism Chronic kidney disease stage III  History of Present Illness: Margaret [name] was last seen by Dr. Eldridge Dace 08/24/2021.  She was volume overloaded in the setting of dietary indiscretion with salt.  He placed her on furosemide 40 mg twice daily for 3 days, then 40 mg daily.  A follow-up echo has been ordered and is to be done today.  She returns for follow-up.  She is here with her daughter who helps interpret.  Since last seen, she is feeling much better.  Her breathing has improved.  She has not had chest pain.  She has not had syncope.  Her leg edema is much improved.  Her weight last time was 124 pounds, per our records.  Her daughter notes that her weight was 144 at home and is now 15.  The patient has been taking half a tablet of furosemide on some days.  She is somewhat concerned about worsening kidney function.  ASSESSMENT & PLAN:   (HFpEF) heart failure with preserved ejection fraction (HCC) Clinically, she has had significant improvement since  starting on furosemide.  She is taking 20 mg on some days.  She still has some volume to lose.  I have encouraged her to go ahead and take furosemide 40 mg every day.  Obtain follow-up BMET today.  I will send a copy to her PCP, who plan to obtain follow-up labs tomorrow.  The results of her echocardiogram are not back yet.  We will keep an eye on this and let her know as soon as we have results.  She is traveling to [place] in mid January.  I will bring her back in 1 month so that we can evaluate her once more before she travels.  CAD (coronary artery disease) of artery bypass graft History of CABG in 2011.  Cardiac catheterization in 2015 demonstrated an occluded vein graft to the OM.  Her LIMA to the LAD and vein graft to the diagonal were both patent.  She is doing well without anginal symptoms.  Continue clopidogrel 75 mg daily, rosuvastatin 10 mg daily, carvedilol 6.25 mg twice daily.  Moderate to severe pulmonary hypertension (HCC) Likely related to left heart failure.  Echocardiogram in 2020 with RVSP 55.6.  Repeat echocardiogram done today.  Results are pending.  There were some concerns about her O2 sats at home.  I repeated her oxygen saturation here and she was 95-99%.  Mitral valve regurgitation, ischemic Repeat echocardiogram was performed earlier today.  Results are still pending.  We will notify the patient  and her daughter once we have results available.  Hyperlipidemia with target LDL less than 70 LDL in 05/2021 was 72.  Continue rosuvastatin 10 mg daily.  Atherosclerotic peripheral vascular disease with intermittent claudication ([place]) She has been managed conservatively.  She is followed by vascular surgery.  Essential hypertension Blood pressure is well controlled on carvedilol 6.25 mg twice daily.  CKD (chronic kidney disease), stage III ([place]) Creatinine 05/26/2021 was 1.16.  Obtain BMET today given recent adjustments in diuretics.  I have encouraged her to take furosemide 40 mg  every day.  We will try to obtain a follow-up BMET in 1 week through her home health agency to keep a close eye on her renal function.  I will send a copy of her BMET from today to her PCP.          Dispo:  Return in about 4 weeks (around 10/19/2021) for Routine Follow Up with Dr. Irish Lack, or Richardson Dopp, PA-C.    Prior CV studies: Echocardiogram 06/25/2019 EF 55-60, GRII DD, normal RVSF, RVSP 55.6 (moderate pulm hypertension), mild BAE, moderate TR, mild MR, mild-moderate aortic stenosis (mean gradient 15.4, V-max 254.2 cm/s, DI 0.31)  Transesophageal echocardiogram 07/23/2014 GRII DD, EF 50-55, mild-moderate MR, normal RVSF, PASP 52  R/L cardiac catheterization 07/22/14 PA 64/31, PCWP 32, LVEDP 34; CO 6.12, CI 3.8 LM ost 50 LAD prox 100 at stent LCx mid sub total occlusion RCA mid 37 S-OM ost 100 S-Dx patent w ost 20 L-LAD patent  EF 40-45, severe MR   Cardiac catheterization (5/11):   Ostial left main 60, OM1 70, circumflex 50-60, mid LAD stent occluded, mid RCA 30-40, RV marginal 90, EF 45-50%. >>> CABG  Echocardiogram (2/14-done in [place]):   EF 65%, normal wall motion, PASP 33 mm Hg, normal diastolic function    Past Medical History:  Diagnosis Date   Acute on chronic renal failure ([place]) 07/31/2014   Acute pulmonary edema with congestive heart failure ([place]) 07/17/2014   Acute renal insufficiency 07/19/2014   Anemia    ARF (acute renal failure) ([place]) 07/31/2014   Atherosclerotic peripheral vascular disease with intermittent claudication ([place]) 04/2014   LEA Dopplers: RABI 1.02/ LABI: 0.61 (L SFA, Pop & DP blunted wave forms) -- correlates with L leg claudication   Atrophic vaginitis 04/2013   CAD (coronary artery disease)    a. prior PCI LAD 2005. b. CABG 2011.   Chronic diastolic CHF (congestive heart failure) (HCC)    CKD (chronic kidney disease), stage III (HCC)    Hx of CABG    Hx of echocardiogram 05/2014   EF 50-55%, inf-lat HK, Gr 2 DD, very mild AS (mean 10 mmHg),  mild MR, PASP 36 mmHg; calcified density in LA (suggest TEE)   Hyperlipidemia with target LDL less than 70    Mitral regurgitation    PAD (peripheral artery disease) (HCC)    Pulmonary hypertension (HCC)    RESPIRATORY ARREST assoc w MYOVIEW    Thyroid disease    Tricuspid regurgitation    Type 2 diabetes mellitus (HCC)    Vitamin B12 deficiency    Current Medications: Current Meds  Medication Sig   carvedilol (COREG) 12.5 MG tablet Take 0.5 tablets (6.25 mg total) by mouth 2 (two) times daily with a meal.   clopidogrel (PLAVIX) 75 MG tablet Take 75 mg by mouth daily.   Cyanocobalamin (VITAMIN B-12) 2500 MCG TABS Take 5,000 mcg by mouth daily.   furosemide (LASIX) 40 MG tablet Take one tablet by  mouth twice a day for 3 days, then take one tablet by mouth once a day thereafter   gabapentin (NEURONTIN) 100 MG capsule 100 mg, 3 times a day as needed for nerve pain   glipiZIDE-metformin (METAGLIP) 5-500 MG per tablet Take 1 tablet by mouth 2 (two) times daily before a meal.   levothyroxine (SYNTHROID) 100 MCG tablet Take 75-100 mcg by mouth See admin instructions. Taking 1 tablet ( ) Monday- Friday and on Saturday, Sunday   linagliptin (TRADJENTA) 5 MG TABS tablet Take 5 mg by mouth every morning.   Multiple Vitamin (MULTIVITAMIN) tablet Take 1 tablet by mouth daily.   repaglinide (PRANDIN) 0.5 MG tablet Take 0.5 mg by mouth 2 (two) times daily.   rosuvastatin (CRESTOR) 10 MG tablet Take 1 tablet (10 mg total) by mouth daily after supper. Rosavel from [place]    Allergies:   Myoview [technetium-19m] and Penicillins   Social History   Tobacco Use   Smoking status: Never   Smokeless tobacco: Never  Vaping Use   Vaping Use: Never used  Substance Use Topics   Alcohol use: No   Drug use: No    Family Hx: The patient's family history includes Arthritis in an other family member; Diabetes in her father and other family members; Ulcers in an other family member. There is no  history of Heart attack.  ROS see HPI  EKGs/Labs/Other Test Reviewed:    EKG:  EKG is not ordered today.  The ekg ordered today demonstrates n/a  Recent Labs: No results found for requested labs within last 8760 hours.   Recent Lipid Panel Lab Results  Component Value Date/Time   CHOL 71 06/26/2019 04:02 AM   TRIG 58 06/26/2019 04:02 AM   HDL 28 (L) 06/26/2019 04:02 AM   LDLCALC 31 06/26/2019 04:02 AM     Risk Assessment/Calculations:          Physical Exam:    VS:  BP 102/64   Pulse 72   Ht 4\' 10"  (1.473 m)   Wt 123 lb 3.2 oz (55.9 kg)   SpO2 90%   BMI 25.75 kg/m     Wt Readings from Last 3 Encounters:  09/21/21 123 lb 3.2 oz (55.9 kg)  08/24/21 124 lb (56.2 kg)  07/12/21 134 lb (60.8 kg)    Constitutional:      Appearance: Healthy appearance. Not in distress.  Neck:     Vascular: JVR present.  Pulmonary:     Effort: Pulmonary effort is normal.     Breath sounds: No wheezing. No rales.  Cardiovascular:     Normal rate. Regular rhythm. Normal S1. Normal S2.      Murmurs: There is a grade 2/6 systolic murmur at the LLSB.  Edema:    Peripheral edema present.    Pretibial: bilateral 2+ edema of the pretibial area. Abdominal:     General: There is no distension.     Palpations: Abdomen is soft.  Skin:    General: Skin is warm and dry.  Neurological:     Mental Status: Alert and oriented to person, place and time.     Cranial Nerves: Cranial nerves are intact.       Medication Adjustments/Labs and Tests Ordered: Current medicines are reviewed at length with the patient today.  Concerns regarding medicines are outlined above.  Tests Ordered: Orders Placed This Encounter  Procedures   Basic metabolic panel   Medication Changes: No orders of the defined types were placed in this  encounter.  Signed, Richardson Dopp, PA-C  09/21/2021 3:11 PM    [facility] [place], [place], [place]  [address] Phone: [phone]; Fax: [phone]

## 2021-09-21 NOTE — Assessment & Plan Note (Signed)
Repeat echocardiogram was performed earlier today.  Results are still pending.  We will notify the patient and her daughter once we have results available.

## 2021-09-21 NOTE — Assessment & Plan Note (Signed)
Creatinine 05/26/2021 was 1.16.  Obtain BMET today given recent adjustments in diuretics.  I have encouraged her to take furosemide 40 mg every day.  We will try to obtain a follow-up BMET in 1 week through her home health agency to keep a close eye on her renal function.  I will send a copy of her BMET from today to her PCP.

## 2021-09-21 NOTE — Assessment & Plan Note (Signed)
LDL in 05/2021 was 72.  Continue rosuvastatin 10 mg daily.

## 2021-09-21 NOTE — Assessment & Plan Note (Addendum)
Likely related to left heart failure.  Echocardiogram in 2020 with RVSP 55.6.  Repeat echocardiogram done today.  Results are pending.  There were some concerns about her O2 sats at home.  I repeated her oxygen saturation here and she was 95-99%.

## 2021-09-21 NOTE — Telephone Encounter (Addendum)
ECHO COMPLETE WO IMAGING ENHANCING AGENT 09/21/2021 IMPRESSIONS 1. Left ventricular ejection fraction, by estimation, is 20 to 25%. The left ventricle has severely decreased function. The left ventricle demonstrates global hypokinesis. Left ventricular diastolic parameters are consistent with Grade II diastolic dysfunction (pseudonormalization). Elevated left atrial pressure. 2. Right ventricular systolic function is moderately reduced. The right ventricular size is normal. Moderately increased right ventricular wall thickness. There is moderately elevated pulmonary artery systolic pressure. The estimated right ventricular systolic pressure is 58.1 mmHg. 3. The mitral valve is normal in structure. Mild mitral valve regurgitation. 4. Tricuspid valve regurgitation is moderate. 5. The inferior vena cava is normal in size with greater than 50% respiratory variability, suggesting right atrial pressure of 3 mmHg. 6. The aortic valve is calcified. There is severe calcifcation of the aortic valve. Aortic valve regurgitation is trivial. Severe aortic valve stenosis. Vmax 2.5 m/s, MG 15 mmHg, AVA 0.4 cm^2, DI 0.20. SV index markedly reduced. Consistent with low flow low gradient severe AS  AORTIC VALVE AV Area (Vmax):    0.44 cm AV Area (Vmean):   0.38 cm AV Area (VTI):     0.36 cm AV Vmax:           238.40 cm/s AV Vmean:          179.400 cm/s AV VTI:            0.459 m AV Peak Grad:      22.7 mmHg AV Mean Grad:      14.2 mmHg LVOT Vmax:         59.17 cm/s LVOT Vmean:        38.633 cm/s LVOT VTI:          0.092 m LVOT/AV VTI ratio: 0.20   Echocardiogram now shows EF 20-25 w low flow low gradient severe aortic stenosis. Reviewed with Dr. Eldridge Dace. Pt needs eval with structural heart team. I called patient's daughter Lawrence Santiago - DPR on file; # is [phone]) and discussed the results.  PLAN:  Please refer to Structural Heart Team for severe aortic stenosis.  Pt can see Elyn Peers  or Lynnette Caffey (first available). Please call daughter with appt. I forgot to tell her when I was on the phone. The pt is supposed to travel to [place] in Jan.  She should not travel until her AS is completely evaluated.  Tereso Newcomer, PA-C    09/21/2021 4:59 PM

## 2021-09-21 NOTE — Assessment & Plan Note (Signed)
Clinically, she has had significant improvement since starting on furosemide.  She is taking 20 mg on some days.  She still has some volume to lose.  I have encouraged her to go ahead and take furosemide 40 mg every day.  Obtain follow-up BMET today.  I will send a copy to her PCP, who plan to obtain follow-up labs tomorrow.  The results of her echocardiogram are not back yet.  We will keep an eye on this and let her know as soon as we have results.  She is traveling to [place] in mid January.  I will bring her back in 1 month so that we can evaluate her once more before she travels.

## 2021-09-21 NOTE — Assessment & Plan Note (Signed)
Blood pressure is well controlled on carvedilol 6.25 mg twice daily.

## 2021-09-22 ENCOUNTER — Telehealth: Payer: Self-pay | Admitting: Interventional Cardiology

## 2021-09-22 ENCOUNTER — Encounter: Payer: Self-pay | Admitting: *Deleted

## 2021-09-22 LAB — BASIC METABOLIC PANEL
BUN/Creatinine Ratio: 19 (ref 12–28)
BUN: 25 mg/dL (ref 8–27)
CO2: 25 mmol/L (ref 20–29)
Calcium: 9.4 mg/dL (ref 8.7–10.3)
Chloride: 101 mmol/L (ref 96–106)
Creatinine, Ser: 1.29 mg/dL — ABNORMAL HIGH (ref 0.57–1.00)
Glucose: 150 mg/dL — ABNORMAL HIGH (ref 70–99)
Potassium: 4.5 mmol/L (ref 3.5–5.2)
Sodium: 140 mmol/L (ref 134–144)
eGFR: 41 mL/min/{1.73_m2} — ABNORMAL LOW (ref >59)

## 2021-09-22 NOTE — Telephone Encounter (Signed)
I spoke with the pt's daughter, Randolm Idol, in regards to Structural Heart referral.  I have scheduled the pt for evaluation with Dr Clifton James on 09/23/2021 at 1:30 PM. I did advise Henna that Scott wanted to make the pt and daughter aware that the pt should not travel to [place] until her aortic stenosis is completely evaluated.  Henna agreed with plan.

## 2021-09-22 NOTE — Telephone Encounter (Signed)
Follow Up:      Daughter is returningcll, concerning lab results.

## 2021-09-22 NOTE — Telephone Encounter (Signed)
-----   Message from Beatrice Lecher, PA-C sent at 09/22/2021  9:10 AM EST ----- Creatinine stable.  K+ normal.   PLAN:  -Continue current medications/treatment plan and follow up as scheduled.  -Please fax a copy of labs to her PCP (Dr. Modesto Charon) today. Tereso Newcomer, PA-C    09/22/2021 9:08 AM

## 2021-09-22 NOTE — Telephone Encounter (Signed)
Loa Socks, LPN  02/25/9793  3:09 PM EST Back to Top    The patients daughter Margaret [name] (on [place]) has been notified of the pts result and verbalized understanding.  All questions (if any) were answered. Loa Socks, LPN 80/10/6551 7:48 PM    A copy was faxed/routed to pts PCP Dr. Modesto Charon, via epic fax function.   Daughter also requested a copy of these results to be left at the front desk for her to pick-up tomorrow.  Will leave a copy at the front desk, as daughter requested.  Daughter verbalized understanding and agrees with this plan.

## 2021-09-23 ENCOUNTER — Encounter: Payer: Self-pay | Admitting: Cardiovascular Disease

## 2021-09-23 ENCOUNTER — Ambulatory Visit (INDEPENDENT_AMBULATORY_CARE_PROVIDER_SITE_OTHER): Payer: 59 | Admitting: Cardiovascular Disease

## 2021-09-23 ENCOUNTER — Other Ambulatory Visit: Payer: Self-pay

## 2021-09-23 VITALS — BP 100/60 | HR 108 | Ht <= 58 in | Wt 128.0 lb

## 2021-09-23 DIAGNOSIS — I350 Nonrheumatic aortic (valve) stenosis: Secondary | ICD-10-CM | POA: Diagnosis not present

## 2021-09-23 NOTE — Progress Notes (Signed)
Structural Heart Clinic Consult Note  Chief Complaint  Patient presents with   New Patient (Initial Visit)    Severe aortic stenosis   History of Present Illness: 87 yo female with history of CKD stage III, diabetes, anemia, PAD, CAD s/p CABG, chronic diastolic CHF, hyperlipidemia, ischemic mitral regurgitation, pulmonary HTN and severe aortic stenosis who is here today as a new consult, referred by Dr. Eldridge Dace, for further evaluation of her aortic stenosis and discussion regarding TAVR. She is followed in our office by Dr. Eldridge Dace. She is known to have mitral regurgitation . She had 4V CABG in 2011 (LIMA to LAD, SVG sequential to OM1/OM3, SVG to Diagonal). Last cardiac cath in 2015 with patent LIMA to LAD, patent SVG to Diagonal and occluded SVG to OM. She has been followed for moderate aortic stenosis. Recently with volume overload with symptomatic improvement on Lasix. Echo 09/21/21 with LVEF=20-25% with global hypokinesis, moderate RV dysfunction, elevated RSVP around 58 mmHg. Mild mitral regurgitation. Severe low flow/low gradient aortic stenosis with thickened and calcified aortic valve, mean gradient 14 mmHg, peak gradient 22.7 mmHg, AVA 0.36 cm2, dimensionless index 0.20. SVI 11.   She is here today with her daughter who interprets for her. She has progressive dyspnea on exertion. No chest pain, dizziness. She does have chronic lower extremity edema. She lives in [place] with her son. She was not employed but stayed at home raising her children. She has been in the [place] for three years. She has full dentures.   Primary Care Physician: Ileana Ladd, MD Primary Cardiologist: Eldridge Dace Referring Cardiologist: Eldridge Dace  Past Medical History:  Diagnosis Date   Acute on chronic renal failure (HCC) 07/31/2014   Acute pulmonary edema with congestive heart failure (HCC) 07/17/2014   Acute renal insufficiency 07/19/2014   Anemia    ARF (acute renal failure) (HCC) 07/31/2014    Atherosclerotic peripheral vascular disease with intermittent claudication (HCC) 04/2014   LEA Dopplers: RABI 1.02/ LABI: 0.61 (L SFA, Pop & DP blunted wave forms) -- correlates with L leg claudication   Atrophic vaginitis 04/2013   CAD (coronary artery disease)    a. prior PCI LAD 2005. b. CABG 2011.   Chronic diastolic CHF (congestive heart failure) (HCC)    CKD (chronic kidney disease), stage III (HCC)    Hx of CABG    Hx of echocardiogram 05/2014   EF 50-55%, inf-lat HK, Gr 2 DD, very mild AS (mean 10 mmHg), mild MR, PASP 36 mmHg; calcified density in LA (suggest TEE)   Hyperlipidemia with target LDL less than 70    Mitral regurgitation    PAD (peripheral artery disease) (HCC)    Pulmonary hypertension (HCC)    RESPIRATORY ARREST assoc w MYOVIEW    Thyroid disease    Tricuspid regurgitation    Type 2 diabetes mellitus (HCC)    Vitamin B12 deficiency     Past Surgical History:  Procedure Laterality Date   CARDIAC CATHETERIZATION  10/24/2003   LAD stent occluded   CARDIAC CATHETERIZATION  03/23/2010    Ostial left main 60, OM1 70, circumflex 50-60, mid LAD stent occluded, mid RCA 30-40, RV marginal 90, EF 45-50%. >>> CABG   CORONARY ARTERY BYPASS GRAFT  03/23/2010   LIMA-LAD, SVG-D1, seqSVG-OM1-OM3   IR EXCHANGE BILIARY DRAIN  07/28/2019   IR PERC CHOLECYSTOSTOMY  06/28/2019   LEFT HEART CATHETERIZATION WITH CORONARY/GRAFT ANGIOGRAM N/A 07/22/2014   Procedure: LEFT HEART CATHETERIZATION WITH Isabel Caprice;  Surgeon: Iran Ouch, MD;  Location: [facility];  Service: Cardiovascular;  Laterality: N/A;   TOTAL KNEE ARTHROPLASTY Right     Current Outpatient Medications  Medication Sig Dispense Refill   carvedilol (COREG) 12.5 MG tablet Take 0.5 tablets (6.25 mg total) by mouth 2 (two) times daily with a meal. 30 tablet 0   clopidogrel (PLAVIX) 75 MG tablet Take 75 mg by mouth daily.     Cyanocobalamin (VITAMIN B-12) 2500 MCG TABS Take 5,000 mcg by mouth daily. 30  tablet 0   furosemide (LASIX) 40 MG tablet Take one tablet by mouth twice a day for 3 days, then take one tablet by mouth once a day thereafter 33 tablet 0   gabapentin (NEURONTIN) 100 MG capsule 100 mg, 3 times a day as needed for nerve pain 90 capsule 3   glipiZIDE-metformin (METAGLIP) 5-500 MG per tablet Take 1 tablet by mouth 2 (two) times daily before a meal.     levothyroxine (SYNTHROID) 100 MCG tablet Take 75-100 mcg by mouth See admin instructions. Taking 1 tablet (169mcg) Monday- Friday and 85mcg on Saturday, Sunday     linagliptin (TRADJENTA) 5 MG TABS tablet Take 5 mg by mouth every morning.     Multiple Vitamin (MULTIVITAMIN) tablet Take 1 tablet by mouth daily.     repaglinide (PRANDIN) 0.5 MG tablet Take 0.5 mg by mouth 2 (two) times daily.     rosuvastatin (CRESTOR) 10 MG tablet Take 1 tablet (10 mg total) by mouth daily after supper. Rosavel from [place] 30 tablet 6   No current facility-administered medications for this visit.    Allergies  Allergen Reactions   Myoview [Technetium-51m] Shortness Of Breath   Penicillins Other (See Comments)    Unknown allergic reaction     Social History   Socioeconomic History   Marital status: Married    Spouse name: Not on file   Number of children: 2   Years of education: Not on file   Highest education level: Not on file  Occupational History   Occupation: Retired   Occupation: Agricultural engineer  Tobacco Use   Smoking status: Never   Smokeless tobacco: Never  Vaping Use   Vaping Use: Never used  Substance and Sexual Activity   Alcohol use: No   Drug use: No   Sexual activity: Not on file  Other Topics Concern   Not on file  Social History Narrative   Son is POA.   Does not smoke - never smoked.   Spends 1/2 year in [place] & 1/2 year in [place].   Social Determinants of Health   Financial Resource Strain: Not on file  Food Insecurity: Not on file  Transportation Needs: Not on file  Physical Activity: Not on file  Stress: Not  on file  Social Connections: Not on file  Intimate Partner Violence: Not on file    Family History  Problem Relation Age of Onset   Diabetes Father    Ulcers Other    Diabetes Other    Arthritis Other    Diabetes Other    Heart attack Neg Hx     Review of Systems:  As stated in the HPI and otherwise negative.   BP 100/60   Pulse (!) 108   Ht 4\' 10"  (1.473 m)   Wt 128 lb (58.1 kg)   SpO2 97%   BMI 26.75 kg/m   Physical Examination: General: Well developed, well nourished, NAD  HEENT: OP clear, mucus membranes moist  SKIN: warm, dry. No rashes. Neuro: No  focal deficits  Musculoskeletal: Muscle strength 5/5 all ext  Psychiatric: Mood and affect normal  Neck: No JVD, no carotid bruits, no thyromegaly, no lymphadenopathy.  Lungs:Clear bilaterally, no wheezes, rhonci, crackles Cardiovascular: Regular rate and rhythm. Loud, harsh, late peaking systolic murmur.  Abdomen:Soft. Bowel sounds present. Non-tender.  Extremities: 2+ bilateral lower extremity edema.   EKG:  EKG is ordered today. The ekg ordered today demonstrates Sinus, poor R wave progression  Echo 09/21/21:  1. Left ventricular ejection fraction, by estimation, is 20 to 25%. The  left ventricle has severely decreased function. The left ventricle  demonstrates global hypokinesis. Left ventricular diastolic parameters are  consistent with Grade II diastolic  dysfunction (pseudonormalization). Elevated left atrial pressure.   2. Right ventricular systolic function is moderately reduced. The right  ventricular size is normal. Moderately increased right ventricular wall  thickness. There is moderately elevated pulmonary artery systolic  pressure. The estimated right ventricular  systolic pressure is Q000111Q mmHg.   3. The mitral valve is normal in structure. Mild mitral valve  regurgitation.   4. Tricuspid valve regurgitation is moderate.   5. The inferior vena cava is normal in size with greater than 50%   respiratory variability, suggesting right atrial pressure of 3 mmHg.   6. The aortic valve is calcified. There is severe calcifcation of the  aortic valve. Aortic valve regurgitation is trivial. Severe aortic valve  stenosis. Vmax 2.5 m/s, MG 15 mmHg, AVA 0.4 cm^2, DI 0.20. SV index  markedly reduced. Consistent with low flow   low gradient severe AS   FINDINGS   Left Ventricle: Left ventricular ejection fraction, by estimation, is 20  to 25%. The left ventricle has severely decreased function. The left  ventricle demonstrates global hypokinesis. The left ventricular internal  cavity size was normal in size. There  is no left ventricular hypertrophy. Left ventricular diastolic parameters  are consistent with Grade II diastolic dysfunction (pseudonormalization).  Elevated left atrial pressure.   Right Ventricle: The right ventricular size is normal. Moderately  increased right ventricular wall thickness. Right ventricular systolic  function is moderately reduced. There is moderately elevated pulmonary  artery systolic pressure. The tricuspid  regurgitant velocity is 3.71 m/s, and with an assumed right atrial  pressure of 3 mmHg, the estimated right ventricular systolic pressure is  Q000111Q mmHg.   Left Atrium: Left atrial size was normal in size.   Right Atrium: Right atrial size was normal in size.   Pericardium: There is no evidence of pericardial effusion.   Mitral Valve: The mitral valve is normal in structure. Mild mitral valve  regurgitation.   Tricuspid Valve: The tricuspid valve is normal in structure. Tricuspid  valve regurgitation is moderate.   Aortic Valve: The aortic valve is calcified. There is severe calcifcation  of the aortic valve. Aortic valve regurgitation is trivial. Aortic  regurgitation PHT measures 217 msec. Severe aortic stenosis is present.  Aortic valve mean gradient measures 14.2  mmHg. Aortic valve peak gradient measures 22.7 mmHg. Aortic valve area,  by  VTI measures 0.36 cm.   Pulmonic Valve: The pulmonic valve was not well visualized. Pulmonic valve  regurgitation is trivial.   Aorta: The aortic root and ascending aorta are structurally normal, with  no evidence of dilitation.   Venous: The inferior vena cava is normal in size with greater than 50%  respiratory variability, suggesting right atrial pressure of 3 mmHg.   IAS/Shunts: The interatrial septum was not well visualized.  LEFT VENTRICLE  PLAX 2D  LVIDd:         4.10 cm   Diastology  LVIDs:         3.60 cm   LV e' medial:    6.31 cm/s  LV PW:         0.80 cm   LV E/e' medial:  21.4  LV IVS:        0.80 cm   LV e' lateral:   6.64 cm/s  LVOT diam:     1.50 cm   LV E/e' lateral: 20.3  LV SV:         16  LV SV Index:   11  LVOT Area:     1.77 cm      RIGHT VENTRICLE            IVC  RV Basal diam:  3.80 cm    IVC diam: 1.70 cm  RV S prime:     4.38 cm/s  TAPSE (M-mode): 0.8 cm   LEFT ATRIUM             Index        RIGHT ATRIUM           Index  LA diam:        4.30 cm 2.89 cm/m   RA Area:     12.10 cm  LA Vol (A2C):   38.2 ml 25.70 ml/m  RA Volume:   27.20 ml  18.30 ml/m  LA Vol (A4C):   26.3 ml 17.70 ml/m  LA Biplane Vol: 32.0 ml 21.53 ml/m   AORTIC VALVE  AV Area (Vmax):    0.44 cm  AV Area (Vmean):   0.38 cm  AV Area (VTI):     0.36 cm  AV Vmax:           238.40 cm/s  AV Vmean:          179.400 cm/s  AV VTI:            0.459 m  AV Peak Grad:      22.7 mmHg  AV Mean Grad:      14.2 mmHg  LVOT Vmax:         59.17 cm/s  LVOT Vmean:        38.633 cm/s  LVOT VTI:          0.092 m  LVOT/AV VTI ratio: 0.20  AI PHT:            217 msec     AORTA  Ao Root diam: 2.40 cm  Ao Asc diam:  2.90 cm   MITRAL VALVE                TRICUSPID VALVE  MV Area (PHT): 5.05 cm     TR Peak grad:   55.1 mmHg  MV Decel Time: 150 msec     TR Vmax:        371.00 cm/s  MV E velocity: 135.00 cm/s  MV A velocity: 74.40 cm/s   SHUNTS  MV E/A ratio:  1.81          Systemic VTI:  0.09 m                              Systemic Diam: 1.50 cm   Recent Labs: 09/21/2021: BUN 25; Creatinine, Ser 1.29; Potassium 4.5; Sodium 140    Wt Readings from Last 3 Encounters:  09/23/21 128 lb (58.1 kg)  09/21/21 123 lb 3.2 oz (55.9 kg)  08/24/21 124 lb (56.2 kg)     Other studies Reviewed: Additional studies/ records that were reviewed today include: echo images, EKG, office notes Review of the above records demonstrates: severe low flow/low gradient AS  Assessment and Plan:   1. Severe Aortic Valve Stenosis: She has severe, stage D2 (low flow/low gradient) aortic valve stenosis. I have personally reviewed the echo images. The aortic valve is thickened, calcified with limited leaflet mobility. I think she would benefit from AVR. Given advanced age, she is not a good candidate for conventional AVR by surgical approach. She is a marginal candidate for TAVR given advanced age and severe LV systolic dysfunction but I would consider her a TAVR candidate for now pending her cardiac cath and pre TAVR CT scans.   I have reviewed the natural history of aortic stenosis with the patient and their family members  who are present today. We have discussed the limitations of medical therapy and the poor prognosis associated with symptomatic aortic stenosis. We have reviewed potential treatment options, including palliative medical therapy, conventional surgical aortic valve replacement, and transcatheter aortic valve replacement. We discussed treatment options in the context of the patient's specific comorbid medical conditions.   She is not sure that she would like to proceed with TAVR at this time. She would like to go home and discuss with her entire family. She has a wedding in [place] in mid January and she thinks she would at least like to delay workup until after the wedding. I have told the patient and her daughter that she will likely begin to have worsening of symptoms soon if we  do not intervene. She will call us back to let us know if we can proceed and arrange her cardiac cath. Risks and benefits of the cath procedure and the valve procedure are reviewed with the patient.       Current medicines are reviewed at length with the patient today.  The patient does not have concerns regarding medicines.  The following changes have been made:  no change  Labs/ tests ordered today include:   Orders Placed This Encounter  Procedures   EKG 12-Lead      Disposition:   F/U with the valve team.    Signed, Lauree Chandler, MD 09/23/2021 4:57 PM    [address], [place], [place]  [address] Phone: [phone]; Fax: [phone]

## 2021-09-23 NOTE — Patient Instructions (Signed)
Medication Instructions:  No changes *If you need a refill on your cardiac medications before your next appointment, please call your pharmacy*   Lab Work: none If you have labs (blood work) drawn today and your tests are completely normal, you will receive your results only by: MyChart Message (if you have MyChart) OR A paper copy in the mail If you have any lab test that is abnormal or we need to change your treatment, we will call you to review the results.   Testing/Procedures: none   Follow-Up: Contact Lauren, RN Structural Heart Valve Nurse if you decide you want to move forward with TAVR surgery.  Other Instructions

## 2021-10-05 ENCOUNTER — Telehealth: Payer: Self-pay | Admitting: Cardiovascular Disease

## 2021-10-05 NOTE — Telephone Encounter (Signed)
Margaret Kotyk, Ms. Rocchi's daughter Donnella Bi texted my cell today and asked me to call her. Her mother has elected to go to [place] for a wedding and delay TAVR workup. She now has worsened LE edema and weeping from her legs. I asked her to increase her Lasix to 40 mg po BID for 4 days then take 40 mg per day. She will call back next week to update you on her fluid status. I asked her to speak to [facility] and have you give further recommendations on her Lasix dosing. I have also offered to begin her TAVR workup to see if she is a candidate for TAVR but they will most likely go to [place] and proceed when she gets back in a month.   Thayer Ohm

## 2021-10-12 NOTE — Telephone Encounter (Signed)
I placed call to patient's daughter to follow up.  Left message to call office ?

## 2021-10-15 NOTE — Progress Notes (Deleted)
Cardiology Office Note   Date:  10/15/2021   ID:  Margaret, [name] [birth year], MRN [id number]  PCP:  Vernie Shanks, MD    No chief complaint on file.    Wt Readings from Last 3 Encounters:  09/23/21 128 lb (58.1 kg)  09/21/21 123 lb 3.2 oz (55.9 kg)  08/24/21 124 lb (56.2 kg)       History of Present Illness: Margaret [name] is a 87 y.o. female   who has known  history of CAD status post CABG in 2011, diabetes, HL, hypothyroidism.  She has had congestive heart failure and renal failure in the past.  SHe has been hospitalized in [place] as well back in 02/2014 for CHF.     In 2015: "She was admitted 09/25 thru 10/02 after she held her Lasix and BB for a Myoview. She had a Lexiscan injection, developed diarrhea, acute CHF requiring BiPAP and was hypoxic.  She was diuresed and then a R/L heart cath was performed. She had ischemic MR due to occlusion of SVG-OM, medical therapy. TEE showed mod TR and mod pulm HTN, but no ruptured chordae. She was started on an ACE inhibitor. She went to her family MD for f/u visit and blood work was performed which showed significantly worsened renal function. She was taken off Lasix and metformin. When her renal function did not improve, she was instructed to come to the ER, and was admitted 10/09. Renal function improved gradually and was discharged home.   She has seen Dr. Fletcher Anon for PAD as well.   Had volume overload in 08/2021.  Improved with Lasix but echo showed: "Left ventricular ejection fraction, by estimation, is 20 to 25%. The  left ventricle has severely decreased function. The left ventricle  demonstrates global hypokinesis. Left ventricular diastolic parameters are  consistent with Grade II diastolic  dysfunction (pseudonormalization). Elevated left atrial pressure.   2. Right ventricular systolic function is moderately reduced. The right  ventricular size is normal. Moderately increased right ventricular wall  thickness. There is  moderately elevated pulmonary artery systolic  pressure. The estimated right ventricular  systolic pressure is [id number] mmHg.   3. The mitral valve is normal in structure. Mild mitral valve  regurgitation.   4. Tricuspid valve regurgitation is moderate.   5. The inferior vena cava is normal in size with greater than 50%  respiratory variability, suggesting right atrial pressure of 3 mmHg.   6. The aortic valve is calcified. There is severe calcifcation of the  aortic valve. Aortic valve regurgitation is trivial. Severe aortic valve  stenosis. Vmax 2.5 m/s, MG 15 mmHg, AVA 0.4 cm^2, DI 0.20. SV index  markedly reduced. Consistent with low flow   low gradient severe AS "      Past Medical History:  Diagnosis Date   Acute on chronic renal failure ([place]) 07/31/2014   Acute pulmonary edema with congestive heart failure ([place]) 07/17/2014   Acute renal insufficiency 07/19/2014   Anemia    ARF (acute renal failure) ([place]) 07/31/2014   Atherosclerotic peripheral vascular disease with intermittent claudication ([place]) 04/2014   LEA Dopplers: RABI 1.02/ LABI: 0.61 (L SFA, Pop & DP blunted wave forms) -- correlates with L leg claudication   Atrophic vaginitis 04/2013   CAD (coronary artery disease)    a. prior PCI LAD 2005. b. CABG 2011.   Chronic diastolic CHF (congestive heart failure) (HCC)    CKD (chronic kidney disease), stage III (HCC)    Hx of CABG  Hx of echocardiogram 05/2014   EF 50-55%, inf-lat HK, Gr 2 DD, very mild AS (mean 10 mmHg), mild MR, PASP 36 mmHg; calcified density in LA (suggest TEE)   Hyperlipidemia with target LDL less than 70    Mitral regurgitation    PAD (peripheral artery disease) (HCC)    Pulmonary hypertension (HCC)    RESPIRATORY ARREST assoc w MYOVIEW    Thyroid disease    Tricuspid regurgitation    Type 2 diabetes mellitus ([place])    Vitamin B12 deficiency     Past Surgical History:  Procedure Laterality Date   CARDIAC CATHETERIZATION  10/24/2003   LAD stent  occluded   CARDIAC CATHETERIZATION  03/23/2010    Ostial left main 60, OM1 70, circumflex 50-60, mid LAD stent occluded, mid RCA 30-40, RV marginal 90, EF 45-50%. >>> CABG   CORONARY ARTERY BYPASS GRAFT  03/23/2010   LIMA-LAD, SVG-D1, seqSVG-OM1-OM3   IR EXCHANGE BILIARY DRAIN  07/28/2019   IR PERC CHOLECYSTOSTOMY  06/28/2019   LEFT HEART CATHETERIZATION WITH CORONARY/GRAFT ANGIOGRAM N/A 07/22/2014   Procedure: LEFT HEART CATHETERIZATION WITH Beatrix Fetters;  Surgeon: Wellington Hampshire, MD;  Location: [facility];  Service: Cardiovascular;  Laterality: N/A;   TOTAL KNEE ARTHROPLASTY Right      Current Outpatient Medications  Medication Sig Dispense Refill   carvedilol (COREG) 12.5 MG tablet Take 0.5 tablets (6.25 mg total) by mouth 2 (two) times daily with a meal. 30 tablet 0   clopidogrel (PLAVIX) 75 MG tablet Take 75 mg by mouth daily.     Cyanocobalamin (VITAMIN B-12) 2500 MCG TABS Take 5,000 mcg by mouth daily. 30 tablet 0   furosemide (LASIX) 40 MG tablet Take one tablet by mouth twice a day for 3 days, then take one tablet by mouth once a day thereafter 33 tablet 0   gabapentin (NEURONTIN) 100 MG capsule 100 mg, 3 times a day as needed for nerve pain 90 capsule 3   glipiZIDE-metformin (METAGLIP) 5-500 MG per tablet Take 1 tablet by mouth 2 (two) times daily before a meal.     levothyroxine (SYNTHROID) 100 MCG tablet Take 75-100 mcg by mouth See admin instructions. Taking 1 tablet (125mcg) Monday- Friday and 85mcg on Saturday, Sunday     linagliptin (TRADJENTA) 5 MG TABS tablet Take 5 mg by mouth every morning.     Multiple Vitamin (MULTIVITAMIN) tablet Take 1 tablet by mouth daily.     repaglinide (PRANDIN) 0.5 MG tablet Take 0.5 mg by mouth 2 (two) times daily.     rosuvastatin (CRESTOR) 10 MG tablet Take 1 tablet (10 mg total) by mouth daily after supper. Rosavel from [place] 30 tablet 6   No current facility-administered medications for this visit.    Allergies:    Myoview [technetium-62m] and Penicillins    Social History:  The patient  reports that she has never smoked. She has never used smokeless tobacco. She reports that she does not drink alcohol and does not use drugs.   Family History:  The patient's ***family history includes Arthritis in an other family member; Diabetes in her father and other family members; Ulcers in an other family member.    ROS:  Please see the history of present illness.   Otherwise, review of systems are positive for ***.   All other systems are reviewed and negative.    PHYSICAL EXAM: VS:  There were no vitals taken for this visit. , BMI There is no height or weight on file to  calculate BMI. GEN: Well nourished, well developed, in no acute distress HEENT: normal Neck: no JVD, carotid bruits, or masses Cardiac: ***RRR; no murmurs, rubs, or gallops,no edema  Respiratory:  clear to auscultation bilaterally, normal work of breathing GI: soft, nontender, nondistended, + BS MS: no deformity or atrophy Skin: warm and dry, no rash Neuro:  Strength and sensation are intact Psych: euthymic mood, full affect   EKG:   The ekg ordered today demonstrates ***   Recent Labs: 09/21/2021: BUN 25; Creatinine, Ser 1.29; Potassium 4.5; Sodium 140   Lipid Panel    Component Value Date/Time   CHOL 71 06/26/2019 0402   TRIG 58 06/26/2019 0402   HDL 28 (L) 06/26/2019 0402   CHOLHDL 2.5 06/26/2019 0402   VLDL 12 06/26/2019 0402   LDLCALC 31 06/26/2019 0402     Other studies Reviewed: Additional studies/ records that were reviewed today with results demonstrating: echo resolves.   ASSESSMENT AND PLAN:  CAD: s/p CABG.  WOuld need R/L heart cath prior to TAVR consideration.  Volume overload improved with Lasix.  Decrease salt intake as well.  Aortic stenosis:  This is causing severe systolic dysfunction. Low flow/low gradient aortic stenosis.  She was evaluated by Dr. Esmond Camper but declined TAVR w/u.  SHe is planning a trip to  [place] in Jan.  I counseled her and her daughter that the patient has a 50% risk of dying within 1 year if her aortic stenosis is not treated with AVR.  If she gets too sick from the AS, she may no longer be a candidate for valve replacement.  PAD: DM: HTN: Hyperlipidemia: CKD: Multifactorial: age related, low flow from aortic stenosis.    Current medicines are reviewed at length with the patient today.  The patient concerns regarding her medicines were addressed.  The following changes have been made:  No change***  Labs/ tests ordered today include: *** No orders of the defined types were placed in this encounter.   Recommend 150 minutes/week of aerobic exercise Low fat, low carb, high fiber diet recommended  Disposition:   FU in ***   Signed, Lance Muss, MD  10/15/2021 3:43 PM    [facility] [address] [place], [place], [place]  [address] Phone: [phone]; Fax: [phone]

## 2021-10-19 ENCOUNTER — Ambulatory Visit: Payer: 59 | Admitting: Interventional Cardiology

## 2021-10-26 NOTE — Telephone Encounter (Signed)
Patient missed appointment with Dr Eldridge Dace on 10/19/21

## 2021-11-10 ENCOUNTER — Other Ambulatory Visit (HOSPITAL_COMMUNITY): Payer: 59

## 2021-11-10 ENCOUNTER — Ambulatory Visit: Payer: 59 | Admitting: Physician Assistant

## 2022-10-23 DEATH — deceased
